# Patient Record
Sex: Female | Born: 1935 | Race: White | Hispanic: No | Smoking: Never smoker
Health system: Southern US, Community
[De-identification: ages and names within clinical notes are randomized; demographics above are authoritative.]

## PROBLEM LIST (undated history)

## (undated) DIAGNOSIS — S329XXA Fracture of unspecified parts of lumbosacral spine and pelvis, initial encounter for closed fracture: Secondary | ICD-10-CM

## (undated) DIAGNOSIS — Z66 Do not resuscitate: Secondary | ICD-10-CM

## (undated) DIAGNOSIS — R410 Disorientation, unspecified: Secondary | ICD-10-CM

## (undated) DIAGNOSIS — M549 Dorsalgia, unspecified: Secondary | ICD-10-CM

## (undated) DIAGNOSIS — E079 Disorder of thyroid, unspecified: Secondary | ICD-10-CM

## (undated) DIAGNOSIS — K219 Gastro-esophageal reflux disease without esophagitis: Secondary | ICD-10-CM

## (undated) DIAGNOSIS — H9190 Unspecified hearing loss, unspecified ear: Secondary | ICD-10-CM

## (undated) HISTORY — PX: ABDOMINAL SURGERY: SHX537

## (undated) HISTORY — PX: CATARACT EXTRACTION: SUR2

## (undated) HISTORY — PX: HEMORRHOID SURGERY: SHX153

---

## 2003-12-29 ENCOUNTER — Ambulatory Visit (HOSPITAL_COMMUNITY): Admission: RE | Admit: 2003-12-29 | Discharge: 2003-12-29 | Payer: Self-pay | Admitting: Internal Medicine

## 2004-01-11 ENCOUNTER — Ambulatory Visit (HOSPITAL_COMMUNITY): Admission: RE | Admit: 2004-01-11 | Discharge: 2004-01-11 | Payer: Self-pay | Admitting: Internal Medicine

## 2009-01-14 ENCOUNTER — Emergency Department (HOSPITAL_COMMUNITY): Admission: EM | Admit: 2009-01-14 | Discharge: 2009-01-14 | Payer: Self-pay | Admitting: Family Medicine

## 2010-12-30 ENCOUNTER — Emergency Department (HOSPITAL_COMMUNITY)
Admission: EM | Admit: 2010-12-30 | Discharge: 2010-12-30 | Payer: Self-pay | Source: Home / Self Care | Admitting: Emergency Medicine

## 2011-01-30 ENCOUNTER — Encounter (HOSPITAL_BASED_OUTPATIENT_CLINIC_OR_DEPARTMENT_OTHER): Payer: Commercial Indemnity | Attending: General Surgery

## 2011-01-30 DIAGNOSIS — S81009A Unspecified open wound, unspecified knee, initial encounter: Secondary | ICD-10-CM | POA: Insufficient documentation

## 2011-01-30 DIAGNOSIS — X58XXXA Exposure to other specified factors, initial encounter: Secondary | ICD-10-CM | POA: Insufficient documentation

## 2011-01-30 DIAGNOSIS — L02419 Cutaneous abscess of limb, unspecified: Secondary | ICD-10-CM | POA: Insufficient documentation

## 2011-03-06 ENCOUNTER — Encounter (HOSPITAL_BASED_OUTPATIENT_CLINIC_OR_DEPARTMENT_OTHER): Payer: Commercial Indemnity | Attending: General Surgery

## 2011-03-06 DIAGNOSIS — X58XXXA Exposure to other specified factors, initial encounter: Secondary | ICD-10-CM | POA: Insufficient documentation

## 2011-03-06 DIAGNOSIS — I739 Peripheral vascular disease, unspecified: Secondary | ICD-10-CM | POA: Insufficient documentation

## 2011-03-06 DIAGNOSIS — Y929 Unspecified place or not applicable: Secondary | ICD-10-CM | POA: Insufficient documentation

## 2011-03-06 DIAGNOSIS — S8990XA Unspecified injury of unspecified lower leg, initial encounter: Secondary | ICD-10-CM | POA: Insufficient documentation

## 2011-04-02 ENCOUNTER — Encounter (HOSPITAL_BASED_OUTPATIENT_CLINIC_OR_DEPARTMENT_OTHER): Payer: Commercial Indemnity | Attending: General Surgery

## 2011-04-02 DIAGNOSIS — L03119 Cellulitis of unspecified part of limb: Secondary | ICD-10-CM | POA: Insufficient documentation

## 2011-04-02 DIAGNOSIS — L02419 Cutaneous abscess of limb, unspecified: Secondary | ICD-10-CM | POA: Insufficient documentation

## 2011-04-02 DIAGNOSIS — S81009A Unspecified open wound, unspecified knee, initial encounter: Secondary | ICD-10-CM | POA: Insufficient documentation

## 2011-04-02 DIAGNOSIS — X58XXXA Exposure to other specified factors, initial encounter: Secondary | ICD-10-CM | POA: Insufficient documentation

## 2011-05-07 ENCOUNTER — Encounter (HOSPITAL_BASED_OUTPATIENT_CLINIC_OR_DEPARTMENT_OTHER): Payer: Commercial Indemnity | Attending: General Surgery

## 2011-05-07 DIAGNOSIS — I739 Peripheral vascular disease, unspecified: Secondary | ICD-10-CM | POA: Insufficient documentation

## 2011-05-07 DIAGNOSIS — Z79899 Other long term (current) drug therapy: Secondary | ICD-10-CM | POA: Insufficient documentation

## 2011-05-07 DIAGNOSIS — S81009A Unspecified open wound, unspecified knee, initial encounter: Secondary | ICD-10-CM | POA: Insufficient documentation

## 2011-05-07 DIAGNOSIS — X58XXXA Exposure to other specified factors, initial encounter: Secondary | ICD-10-CM | POA: Insufficient documentation

## 2011-06-04 ENCOUNTER — Encounter (HOSPITAL_BASED_OUTPATIENT_CLINIC_OR_DEPARTMENT_OTHER): Payer: Commercial Indemnity | Attending: General Surgery

## 2011-06-04 DIAGNOSIS — S91009A Unspecified open wound, unspecified ankle, initial encounter: Secondary | ICD-10-CM | POA: Insufficient documentation

## 2011-06-04 DIAGNOSIS — S81009A Unspecified open wound, unspecified knee, initial encounter: Secondary | ICD-10-CM | POA: Insufficient documentation

## 2011-06-04 DIAGNOSIS — X58XXXA Exposure to other specified factors, initial encounter: Secondary | ICD-10-CM | POA: Insufficient documentation

## 2011-06-04 DIAGNOSIS — Z79899 Other long term (current) drug therapy: Secondary | ICD-10-CM | POA: Insufficient documentation

## 2011-06-04 DIAGNOSIS — I739 Peripheral vascular disease, unspecified: Secondary | ICD-10-CM | POA: Insufficient documentation

## 2011-08-07 ENCOUNTER — Emergency Department (HOSPITAL_COMMUNITY)
Admission: EM | Admit: 2011-08-07 | Discharge: 2011-08-07 | Disposition: A | Discharge status: Home / Self Care | Payer: 59 | Attending: Emergency Medicine | Admitting: Emergency Medicine

## 2011-08-07 ENCOUNTER — Emergency Department (HOSPITAL_COMMUNITY): Payer: 59

## 2011-08-07 DIAGNOSIS — M4120 Other idiopathic scoliosis, site unspecified: Secondary | ICD-10-CM | POA: Insufficient documentation

## 2011-08-07 DIAGNOSIS — M419 Scoliosis, unspecified: Secondary | ICD-10-CM

## 2011-08-07 DIAGNOSIS — R109 Unspecified abdominal pain: Secondary | ICD-10-CM | POA: Insufficient documentation

## 2011-08-07 DIAGNOSIS — Y92009 Unspecified place in unspecified non-institutional (private) residence as the place of occurrence of the external cause: Secondary | ICD-10-CM | POA: Insufficient documentation

## 2011-08-07 DIAGNOSIS — IMO0002 Reserved for concepts with insufficient information to code with codable children: Secondary | ICD-10-CM | POA: Insufficient documentation

## 2011-08-07 DIAGNOSIS — E079 Disorder of thyroid, unspecified: Secondary | ICD-10-CM | POA: Insufficient documentation

## 2011-08-07 DIAGNOSIS — H409 Unspecified glaucoma: Secondary | ICD-10-CM | POA: Insufficient documentation

## 2011-08-07 DIAGNOSIS — S32009A Unspecified fracture of unspecified lumbar vertebra, initial encounter for closed fracture: Secondary | ICD-10-CM

## 2011-08-07 HISTORY — DX: Gastro-esophageal reflux disease without esophagitis: K21.9

## 2011-08-07 HISTORY — DX: Disorder of thyroid, unspecified: E07.9

## 2011-08-07 LAB — CBC
HCT: 37.7 % (ref 36.0–46.0)
Hemoglobin: 12.8 g/dL (ref 12.0–15.0)
MCH: 30.1 pg (ref 26.0–34.0)
MCHC: 34 g/dL (ref 30.0–36.0)
MCV: 88.7 fL (ref 78.0–100.0)
Platelets: 282 10*3/uL (ref 150–400)
RBC: 4.25 MIL/uL (ref 3.87–5.11)
RDW: 13.5 % (ref 11.5–15.5)
WBC: 5.5 10*3/uL (ref 4.0–10.5)

## 2011-08-07 LAB — BASIC METABOLIC PANEL
BUN: 11 mg/dL (ref 6–23)
CO2: 26 mEq/L (ref 19–32)
Calcium: 10.2 mg/dL (ref 8.4–10.5)
Chloride: 93 mEq/L — ABNORMAL LOW (ref 96–112)
Creatinine, Ser: 0.76 mg/dL (ref 0.50–1.10)
GFR calc Af Amer: >60 mL/min (ref >60)
GFR calc non Af Amer: >60 mL/min (ref >60)
Glucose, Bld: 114 mg/dL — ABNORMAL HIGH (ref 70–99)
Potassium: 4.8 mEq/L (ref 3.5–5.1)
Sodium: 132 mEq/L — ABNORMAL LOW (ref 135–145)

## 2011-08-07 LAB — DIFFERENTIAL
Basophils Absolute: 0.1 10*3/uL (ref 0.0–0.1)
Basophils Relative: 2 % — ABNORMAL HIGH (ref 0–1)
Eosinophils Absolute: 0.1 10*3/uL (ref 0.0–0.7)
Eosinophils Relative: 1 % (ref 0–5)
Lymphocytes Relative: 28 % (ref 12–46)
Lymphs Abs: 1.5 10*3/uL (ref 0.7–4.0)
Monocytes Absolute: 0.7 10*3/uL (ref 0.1–1.0)
Monocytes Relative: 12 % (ref 3–12)
Neutro Abs: 3.1 10*3/uL (ref 1.7–7.7)
Neutrophils Relative %: 57 % (ref 43–77)

## 2011-08-07 LAB — URINALYSIS, ROUTINE W REFLEX MICROSCOPIC
Bilirubin Urine: NEGATIVE
Glucose, UA: NEGATIVE mg/dL
Hgb urine dipstick: NEGATIVE
Ketones, ur: NEGATIVE mg/dL
Leukocytes, UA: NEGATIVE
Nitrite: NEGATIVE
Protein, ur: NEGATIVE mg/dL
Specific Gravity, Urine: 1.01 (ref 1.005–1.030)
Urobilinogen, UA: 0.2 mg/dL (ref 0.0–1.0)
pH: 6.5 (ref 5.0–8.0)

## 2011-08-07 MED ORDER — TRAMADOL HCL 50 MG PO TABS
50.0000 mg | ORAL_TABLET | Freq: Once | ORAL | Status: AC
  Administered 2011-08-07: 50 mg via ORAL
  Filled 2011-08-07: qty 1

## 2011-08-07 MED ORDER — TRAMADOL HCL 50 MG PO TABS
50.0000 mg | ORAL_TABLET | Freq: Four times a day (QID) | ORAL | Status: AC | PRN
Start: 2011-08-07 — End: 2011-08-17

## 2011-08-07 NOTE — ED Provider Notes (Signed)
Scribed for Dr. Terressa Koyanagi, the patient was seen in room 7. This chart was scribed by [employer]. This patient's care was started at 18:15.   History     CSN: [id number] Arrival date & time: 08/07/2011  5:22 PM  Chief Complaint  Patient presents with  . Back Pain   Patient is a 76 y.o. female presenting with back pain. The history is provided by the patient and a relative.  Back Pain  Exacerbated by: movement. Pertinent negatives include no chest pain, no fever, no numbness, no headaches, no abdominal pain, no dysuria, no leg pain and no tingling.  Patient presents to the ED with a complaint of a sharp Lower left region back pain. A family member reports that she "sat on the commode about a week ago and caught it on something" with bruising a day or two later. She states that pain has progressively gotten worse and is aggravated by movement. She has recently had a loss of appetite and "pees more often". Patient denies dysuria, abdominal pain, diarrhea, vomiting, fevers or hematuria.    Past Medical History  Diagnosis Date  . Glaucoma   . Thyroid disease   . GERD (gastroesophageal reflux disease)     History reviewed. No pertinent past surgical history.  History reviewed. No pertinent family history.  History  Substance Use Topics  . Smoking status: Not on file  . Smokeless tobacco: Not on file  . Alcohol Use: No    OB History    Grav Para Term Preterm Abortions TAB SAB Ect Mult Living                  Review of Systems  Constitutional: Negative for fever and chills.  HENT: Negative for neck pain.   Eyes: Negative for photophobia and visual disturbance.  Cardiovascular: Negative for chest pain.  Gastrointestinal: Negative for nausea, vomiting, abdominal pain and diarrhea.  Genitourinary: Negative for dysuria and hematuria.  Musculoskeletal: Positive for back pain.  Skin: Negative for pallor and rash.  Neurological: Negative for tingling, numbness and headaches.   Psychiatric/Behavioral: Negative for agitation.  All other systems reviewed and are negative.    Physical Exam  BP 145/70  Pulse 83  Temp(Src) 97.5 F (36.4 C) (Oral)  Resp 20  Wt 120 lb (54.432 kg)  SpO2 100%  Physical Exam  Constitutional: She is oriented to person, place, and time. No distress.  HENT:  Head: Atraumatic.  Mouth/Throat: Oropharynx is clear and moist. No oral lesions. No oropharyngeal exudate.  Eyes:       Bilateral lens implants  Neck: Normal range of motion. Neck supple. No JVD present. No tracheal tenderness present. Carotid bruit is not present. No tracheal deviation present. No thyromegaly present.  Cardiovascular: Normal rate and regular rhythm.   Pulmonary/Chest: Effort normal and breath sounds normal. No stridor. She has no wheezes. She has no rales.  Abdominal: She exhibits no distension. There is no tenderness. There is no rebound and no guarding.  Musculoskeletal: Normal range of motion. She exhibits no tenderness.  Neurological: She is alert and oriented to person, place, and time. No cranial nerve deficit.       Kyphoscoliosis and Perineal tenderness  Skin: Skin is warm and dry. No rash noted.  Psychiatric: She has a normal mood and affect. Her behavior is normal.    ED Course  Procedures  OTHER DATA REVIEWED: Nursing notes, vital signs, and past medical records reviewed. All labs/vitals reviewed and considered Imaging results reviewed and  considered   DIAGNOSTIC STUDIES: Oxygen Saturation is 100% on room air, normal by my interpretation.      LABS / RADIOLOGY: Results for orders placed during the hospital encounter of 08/07/11  CBC      Component Value Range   WBC 5.5  4.0 - 10.5 (K/uL)   RBC 4.25  3.87 - 5.11 (MIL/uL)   Hemoglobin 12.8  12.0 - 15.0 (g/dL)   HCT 16.1  09.6 - 04.5 (%)   MCV 88.7  78.0 - 100.0 (fL)   MCH 30.1  26.0 - 34.0 (pg)   MCHC 34.0  30.0 - 36.0 (g/dL)   RDW 40.9  81.1 - 91.4 (%)   Platelets 282  150 - 400  (K/uL)  DIFFERENTIAL      Component Value Range   Neutrophils Relative 57  43 - 77 (%)   Neutro Abs 3.1  1.7 - 7.7 (K/uL)   Lymphocytes Relative 28  12 - 46 (%)   Lymphs Abs 1.5  0.7 - 4.0 (K/uL)   Monocytes Relative 12  3 - 12 (%)   Monocytes Absolute 0.7  0.1 - 1.0 (K/uL)   Eosinophils Relative 1  0 - 5 (%)   Eosinophils Absolute 0.1  0.0 - 0.7 (K/uL)   Basophils Relative 2 (*) 0 - 1 (%)   Basophils Absolute 0.1  0.0 - 0.1 (K/uL)  BASIC METABOLIC PANEL      Component Value Range   Sodium 132 (*) 135 - 145 (mEq/L)   Potassium 4.8  3.5 - 5.1 (mEq/L)   Chloride 93 (*) 96 - 112 (mEq/L)   CO2 26  19 - 32 (mEq/L)   Glucose, Bld 114 (*) 70 - 99 (mg/dL)   BUN 11  6 - 23 (mg/dL)   Creatinine, Ser 7.82  0.50 - 1.10 (mg/dL)   Calcium 95.6  8.4 - 10.5 (mg/dL)   GFR calc non Af Amer >60  >60 (mL/min)   GFR calc Af Amer >60  >60 (mL/min)  URINALYSIS, ROUTINE W REFLEX MICROSCOPIC      Component Value Range   Color, Urine YELLOW  YELLOW    Appearance CLEAR  CLEAR    Specific Gravity, Urine 1.010  1.005 - 1.030    pH 6.5  5.0 - 8.0    Glucose, UA NEGATIVE  NEGATIVE (mg/dL)   Hgb urine dipstick NEGATIVE  NEGATIVE    Bilirubin Urine NEGATIVE  NEGATIVE    Ketones, ur NEGATIVE  NEGATIVE (mg/dL)   Protein, ur NEGATIVE  NEGATIVE (mg/dL)   Urobilinogen, UA 0.2  0.0 - 1.0 (mg/dL)   Nitrite NEGATIVE  NEGATIVE    Leukocytes, UA NEGATIVE  NEGATIVE     Dg Lumbar Spine Complete  08/07/2011  *RADIOLOGY REPORT*  Clinical Data: Back pain, injury  LUMBAR SPINE - COMPLETE 4+ VIEW  Comparison: None.  Findings: Wedging of the L2 vertebral body, with approximately 25% loss of height, age indeterminate.  Otherwise, no fracture or dislocation seen.  Multilevel degenerative changes.  Vascular calcifications.  IMPRESSION: Wedging of the L2 vertebral body, with approximately 25% loss of height, age indeterminate.  Multilevel degenerative changes.  Original Report Authenticated By: Charline Bills, M.D.     PROCEDURES:   ED COURSE / COORDINATION OF CARE: Orders Placed This Encounter  Procedures  . Urine culture  . DG Lumbar Spine Complete  . CT Abdomen Pelvis Wo Contrast  . CBC  . Differential  . Basic metabolic panel  . Urinalysis, Routine w reflex microscopic  MDM: Differential Diagnosis: COMPRESSION FRACTURE  IMPRESSION: Diagnoses that have been ruled out:  Diagnoses that are still under consideration:  Final diagnoses:      PLAThe patient is to return the emergency department if there is any worsening of symptoms.  Return for weakness numbness or any concerns.  Follow up with your doctor I have reviewed the discharge instructions with thE PATIENT  I personally performed the services described in this documentation, which was scribed in my presence. The recorded information has been reviewed and considered. Lynnie Koehler Smitty Cords, MD    MEDICATIONS GIVEN IN THE E.D.  Medications  levothyroxine (SYNTHROID, LEVOTHROID) 50 MCG tablet (not administered)  Calcium Carbonate-Vitamin D (CALCIUM 600 + D PO) (not administered)  Cholecalciferol (VITAMIN D3) 1000 UNITS CAPS (not administered)  calcium carbonate (TUMS EX) 750 MG chewable tablet (not administered)  timolol (BETIMOL) 0.5 % ophthalmic solution (not administered)  traMADol (ULTRAM) tablet 50 mg (50 mg Oral Given 08/07/11 1813)     DISCHARGE MEDICATIONS: New Prescriptions   No medications on file           Velvia Mehrer K Arber Wiemers-Rasch, MD 08/07/11 2008

## 2011-08-07 NOTE — ED Notes (Signed)
Pt reports some improvement in pain level.  States it is tolerable at this time. Denies any needs at present time.  No distress noted.

## 2011-08-07 NOTE — ED Notes (Signed)
Family states pt was sitting on the commode and the back of the seat hit her in the back.

## 2011-08-08 LAB — URINE CULTURE
Colony Count: NO GROWTH
Culture  Setup Time: 201208081840
Culture: NO GROWTH

## 2012-07-24 ENCOUNTER — Emergency Department (HOSPITAL_COMMUNITY): Payer: Medicare (Managed Care)

## 2012-07-24 ENCOUNTER — Encounter (HOSPITAL_COMMUNITY): Payer: Self-pay | Admitting: Emergency Medicine

## 2012-07-24 ENCOUNTER — Inpatient Hospital Stay (HOSPITAL_COMMUNITY)
Admission: EM | Admit: 2012-07-24 | Discharge: 2012-07-28 | DRG: 603 | Disposition: A | Discharge status: Home Health | Payer: Medicare (Managed Care) | Attending: Internal Medicine | Admitting: Internal Medicine

## 2012-07-24 DIAGNOSIS — L03119 Cellulitis of unspecified part of limb: Principal | ICD-10-CM | POA: Diagnosis present

## 2012-07-24 DIAGNOSIS — L03116 Cellulitis of left lower limb: Secondary | ICD-10-CM

## 2012-07-24 DIAGNOSIS — Z809 Family history of malignant neoplasm, unspecified: Secondary | ICD-10-CM

## 2012-07-24 DIAGNOSIS — Z880 Allergy status to penicillin: Secondary | ICD-10-CM

## 2012-07-24 DIAGNOSIS — L02419 Cutaneous abscess of limb, unspecified: Principal | ICD-10-CM | POA: Diagnosis present

## 2012-07-24 DIAGNOSIS — H409 Unspecified glaucoma: Secondary | ICD-10-CM | POA: Diagnosis present

## 2012-07-24 DIAGNOSIS — B353 Tinea pedis: Secondary | ICD-10-CM

## 2012-07-24 DIAGNOSIS — Z91018 Allergy to other foods: Secondary | ICD-10-CM

## 2012-07-24 DIAGNOSIS — K219 Gastro-esophageal reflux disease without esophagitis: Secondary | ICD-10-CM

## 2012-07-24 DIAGNOSIS — E039 Hypothyroidism, unspecified: Secondary | ICD-10-CM

## 2012-07-24 DIAGNOSIS — Z882 Allergy status to sulfonamides status: Secondary | ICD-10-CM

## 2012-07-24 LAB — CBC WITH DIFFERENTIAL/PLATELET
Basophils Absolute: 0.1 10*3/uL (ref 0.0–0.1)
Basophils Relative: 1 % (ref 0–1)
Eosinophils Absolute: 0.3 10*3/uL (ref 0.0–0.7)
Eosinophils Relative: 4 % (ref 0–5)
HCT: 36.9 % (ref 36.0–46.0)
Hemoglobin: 12.2 g/dL (ref 12.0–15.0)
Lymphocytes Relative: 27 % (ref 12–46)
Lymphs Abs: 1.9 10*3/uL (ref 0.7–4.0)
MCH: 30.2 pg (ref 26.0–34.0)
MCHC: 33.1 g/dL (ref 30.0–36.0)
MCV: 91.3 fL (ref 78.0–100.0)
Monocytes Absolute: 0.8 10*3/uL (ref 0.1–1.0)
Monocytes Relative: 11 % (ref 3–12)
Neutro Abs: 4.1 10*3/uL (ref 1.7–7.7)
Neutrophils Relative %: 58 % (ref 43–77)
Platelets: 268 10*3/uL (ref 150–400)
RBC: 4.04 MIL/uL (ref 3.87–5.11)
RDW: 13.8 % (ref 11.5–15.5)
WBC: 7.1 10*3/uL (ref 4.0–10.5)

## 2012-07-24 LAB — COMPREHENSIVE METABOLIC PANEL
ALT: 12 U/L (ref 0–35)
AST: 20 U/L (ref 0–37)
Albumin: 4.1 g/dL (ref 3.5–5.2)
Alkaline Phosphatase: 60 U/L (ref 39–117)
BUN: 15 mg/dL (ref 6–23)
CO2: 29 mEq/L (ref 19–32)
Calcium: 10.1 mg/dL (ref 8.4–10.5)
Chloride: 96 mEq/L (ref 96–112)
Creatinine, Ser: 0.82 mg/dL (ref 0.50–1.10)
GFR calc Af Amer: 67 mL/min — ABNORMAL LOW (ref >90)
GFR calc non Af Amer: 58 mL/min — ABNORMAL LOW (ref >90)
Glucose, Bld: 113 mg/dL — ABNORMAL HIGH (ref 70–99)
Potassium: 4 mEq/L (ref 3.5–5.1)
Sodium: 134 mEq/L — ABNORMAL LOW (ref 135–145)
Total Bilirubin: 0.3 mg/dL (ref 0.3–1.2)
Total Protein: 7.8 g/dL (ref 6.0–8.3)

## 2012-07-24 LAB — URINALYSIS, ROUTINE W REFLEX MICROSCOPIC
Bilirubin Urine: NEGATIVE
Glucose, UA: NEGATIVE mg/dL
Hgb urine dipstick: NEGATIVE
Ketones, ur: NEGATIVE mg/dL
Leukocytes, UA: NEGATIVE
Nitrite: NEGATIVE
Protein, ur: NEGATIVE mg/dL
Specific Gravity, Urine: <1.005 — ABNORMAL LOW (ref 1.005–1.030)
Urobilinogen, UA: 0.2 mg/dL (ref 0.0–1.0)
pH: 6.5 (ref 5.0–8.0)

## 2012-07-24 NOTE — ED Provider Notes (Signed)
History  This chart was scribed for Geoffery Lyons, MD by Bennett Scrape. This patient was seen in room APA11/APA11 and the patient's care was started at 9:59PM  CSN: [id number]  Arrival date & time 07/24/12  2130   First MD Initiated Contact with Patient 07/24/12 2159      Chief Complaint  Patient presents with  . Leg Swelling    The history is provided by the patient and a relative (daughter). No language interpreter was used.    Tina Lane is a 78 y.o. female who presents to the Emergency Department complaining of one week of gradual onset, gradually worsening, constant left lower calf swelling with occasional sharp pains and itching. She reports that the symptoms improve at night but daughter states that the pt has not been consistent with elevating her legs. Daughter denies pt having prior episodes of similar symptoms. Pt denies taking OTC medications at home to improve symptoms. She denies fever, chills, nausea and emesis as associated symptoms. She has a h/o glaucoma, thyroid disease and GERD. She denies smoking and alcohol use.  No current PCP. Lives in [place].   Past Medical History  Diagnosis Date  . Glaucoma   . Thyroid disease   . GERD (gastroesophageal reflux disease)     Past Surgical History  Procedure Date  . Abdominal surgery     History reviewed. No pertinent family history.  History  Substance Use Topics  . Smoking status: Never Smoker   . Smokeless tobacco: Not on file  . Alcohol Use: No  Pt lives by herself.  No OB history provided.  Review of Systems  Constitutional: Negative for fever and chills.  Gastrointestinal: Negative for nausea and vomiting.  Skin: Negative for rash.       Erythema and swelling to left lower calf   All other systems reviewed and are negative.    Allergies  Onion; Other; Penicillins; and Sulfa antibiotics  Home Medications   Current Outpatient Rx  Name Route Sig Dispense Refill  . CALCIUM CARBONATE ANTACID  750 MG PO CHEW Oral Chew 1 tablet by mouth at bedtime.      Marland Kitchen CALCIUM 600 + D PO Oral Take 1 tablet by mouth daily.      Marland Kitchen VITAMIN D3 1000 UNITS PO CAPS Oral Take 1 capsule by mouth daily.      Marland Kitchen LEVOTHYROXINE SODIUM 50 MCG PO TABS Oral Take 50 mcg by mouth daily.      Marland Kitchen TIMOLOL HEMIHYDRATE 0.5 % OP SOLN Both Eyes Place 1 drop into both eyes at bedtime.        Triage Vitals: BP 162/79  Pulse 96  Temp 98.1 F (36.7 C) (Oral)  Resp 14  Ht 5' (1.524 m)  Wt 120 lb (54.432 kg)  BMI 23.44 kg/m2  SpO2 99%  Physical Exam  Nursing note and vitals reviewed. Constitutional: She is oriented to person, place, and time. She appears well-developed and well-nourished. No distress.  HENT:  Head: Normocephalic and atraumatic.  Eyes: Conjunctivae and EOM are normal.  Neck: Neck supple. No tracheal deviation present.  Cardiovascular: Normal rate, regular rhythm and intact distal pulses.   No murmur heard. Pulmonary/Chest: Effort normal and breath sounds normal. No respiratory distress.  Abdominal: Soft. She exhibits no distension.  Musculoskeletal: Normal range of motion.  Neurological: She is alert and oriented to person, place, and time.       motor and sensation are intact in the distal left extremity.   Skin: Skin  is warm and dry.       Left lower calf has erythema from the mid calf down and is warm to the touch   Psychiatric: She has a normal mood and affect. Her behavior is normal.    ED Course  Procedures (including critical care time)  DIAGNOSTIC STUDIES: Oxygen Saturation is 99% on room air, normal by my interpretation.    COORDINATION OF CARE: 10:12PM-Discussed treatment plan which includes admission for repeat IV antibiotic doses with pt and daughter at bedside and both agreed to plan.  Labs Reviewed - No data to display No results found.   No diagnosis found.    MDM  The patient presents with a severe case of cellulitis of the left leg.  Due to the severity and  patient's age, I feel as though she will require iv antibiotics.  I have consulted Dr. Orvan Falconer from internal medicine who will come and see the patient.  I will defer the choice of antibiotics to him as he is currently in the room with the patient.  CBC shows no leukocytosis, bnp within normal limits.  She will be admitted to the medicine service.        I personally performed the services described in this documentation, which was scribed in my presence. The recorded information has been reviewed and considered.      Geoffery Lyons, MD 07/25/12 0800

## 2012-07-24 NOTE — ED Notes (Signed)
Pt has bilateral lower leg swelling L  leg worse than R leg. Legs red and hot to touch; Area is below the knees to ankles  pt states itching with occ pain.

## 2012-07-24 NOTE — ED Notes (Signed)
Patient complaining of left leg swelling, redness, itching, and pain that started approximately a week ago. Hot to the touch.

## 2012-07-25 ENCOUNTER — Encounter (HOSPITAL_COMMUNITY): Payer: Self-pay | Admitting: *Deleted

## 2012-07-25 ENCOUNTER — Inpatient Hospital Stay (HOSPITAL_COMMUNITY): Payer: Medicare (Managed Care)

## 2012-07-25 DIAGNOSIS — K219 Gastro-esophageal reflux disease without esophagitis: Secondary | ICD-10-CM

## 2012-07-25 DIAGNOSIS — B353 Tinea pedis: Secondary | ICD-10-CM

## 2012-07-25 DIAGNOSIS — L02419 Cutaneous abscess of limb, unspecified: Secondary | ICD-10-CM

## 2012-07-25 DIAGNOSIS — E039 Hypothyroidism, unspecified: Secondary | ICD-10-CM

## 2012-07-25 DIAGNOSIS — L03119 Cellulitis of unspecified part of limb: Secondary | ICD-10-CM

## 2012-07-25 LAB — BASIC METABOLIC PANEL
BUN: 12 mg/dL (ref 6–23)
CO2: 29 mEq/L (ref 19–32)
Chloride: 100 mEq/L (ref 96–112)
GFR calc non Af Amer: 69 mL/min — ABNORMAL LOW (ref >90)
Glucose, Bld: 95 mg/dL (ref 70–99)
Potassium: 4.2 mEq/L (ref 3.5–5.1)
Sodium: 136 mEq/L (ref 135–145)

## 2012-07-25 LAB — CBC
HCT: 36.2 % (ref 36.0–46.0)
Hemoglobin: 12 g/dL (ref 12.0–15.0)
MCH: 30.1 pg (ref 26.0–34.0)
MCHC: 33.1 g/dL (ref 30.0–36.0)
MCV: 90.7 fL (ref 78.0–100.0)
RBC: 3.99 MIL/uL (ref 3.87–5.11)

## 2012-07-25 LAB — T4, FREE: Free T4: 0.77 ng/dL — ABNORMAL LOW (ref 0.80–1.80)

## 2012-07-25 LAB — TSH: TSH: 13.665 u[IU]/mL — ABNORMAL HIGH (ref 0.350–4.500)

## 2012-07-25 MED ORDER — LEVOTHYROXINE SODIUM 50 MCG PO TABS
50.0000 ug | ORAL_TABLET | Freq: Every day | ORAL | Status: DC
  Administered 2012-07-25 – 2012-07-26 (×2): 50 ug via ORAL
  Filled 2012-07-25 (×2): qty 1

## 2012-07-25 MED ORDER — POTASSIUM CHLORIDE IN NACL 20-0.9 MEQ/L-% IV SOLN
INTRAVENOUS | Status: DC
  Administered 2012-07-25: 02:00:00 via INTRAVENOUS

## 2012-07-25 MED ORDER — DOXYCYCLINE HYCLATE 100 MG IV SOLR
100.0000 mg | Freq: Two times a day (BID) | INTRAVENOUS | Status: DC
  Administered 2012-07-25 – 2012-07-28 (×8): 100 mg via INTRAVENOUS
  Filled 2012-07-25 (×10): qty 100

## 2012-07-25 MED ORDER — ACETAMINOPHEN 650 MG RE SUPP: 650.0000 mg | Freq: Four times a day (QID) | RECTAL | Status: DC | PRN

## 2012-07-25 MED ORDER — METRONIDAZOLE IN NACL 5-0.79 MG/ML-% IV SOLN
500.0000 mg | Freq: Three times a day (TID) | INTRAVENOUS | Status: DC
  Administered 2012-07-25 – 2012-07-28 (×10): 500 mg via INTRAVENOUS
  Filled 2012-07-25 (×13): qty 100

## 2012-07-25 MED ORDER — SENNOSIDES-DOCUSATE SODIUM 8.6-50 MG PO TABS: 1.0000 | ORAL_TABLET | Freq: Every evening | ORAL | Status: DC | PRN

## 2012-07-25 MED ORDER — BISACODYL 5 MG PO TBEC: 5.0000 mg | DELAYED_RELEASE_TABLET | Freq: Every day | ORAL | Status: DC | PRN

## 2012-07-25 MED ORDER — TRAZODONE HCL 50 MG PO TABS: 25.0000 mg | ORAL_TABLET | Freq: Every evening | ORAL | Status: DC | PRN

## 2012-07-25 MED ORDER — PANTOPRAZOLE SODIUM 40 MG PO TBEC
40.0000 mg | DELAYED_RELEASE_TABLET | Freq: Every day | ORAL | Status: DC
  Administered 2012-07-25 – 2012-07-28 (×4): 40 mg via ORAL
  Filled 2012-07-25 (×5): qty 1

## 2012-07-25 MED ORDER — FLEET ENEMA 7-19 GM/118ML RE ENEM: 1.0000 | ENEMA | Freq: Once | RECTAL | Status: AC | PRN

## 2012-07-25 MED ORDER — TIMOLOL MALEATE 0.5 % OP SOLN
1.0000 [drp] | Freq: Every day | OPHTHALMIC | Status: DC
  Administered 2012-07-25 – 2012-07-27 (×3): 1 [drp] via OPHTHALMIC

## 2012-07-25 MED ORDER — ENOXAPARIN SODIUM 40 MG/0.4ML ~~LOC~~ SOLN
40.0000 mg | Freq: Every day | SUBCUTANEOUS | Status: DC
  Administered 2012-07-25 – 2012-07-28 (×4): 40 mg via SUBCUTANEOUS
  Filled 2012-07-25 (×4): qty 0.4

## 2012-07-25 MED ORDER — ACETAMINOPHEN 325 MG PO TABS: 650.0000 mg | ORAL_TABLET | ORAL | Status: DC | PRN

## 2012-07-25 MED ORDER — CLOTRIMAZOLE 1 % EX CREA
TOPICAL_CREAM | CUTANEOUS | Status: AC
  Filled 2012-07-25: qty 15

## 2012-07-25 MED ORDER — TIMOLOL HEMIHYDRATE 0.5 % OP SOLN
1.0000 [drp] | Freq: Every day | OPHTHALMIC | Status: DC
  Administered 2012-07-25: 1 [drp] via OPHTHALMIC
  Filled 2012-07-25: qty 0.1
  Filled 2012-07-25: qty 5
  Filled 2012-07-25 (×9): qty 0.1

## 2012-07-25 MED ORDER — CLOTRIMAZOLE 1 % EX CREA
TOPICAL_CREAM | Freq: Two times a day (BID) | CUTANEOUS | Status: DC
  Administered 2012-07-25 – 2012-07-27 (×6): via TOPICAL
  Administered 2012-07-27: 1 via TOPICAL
  Administered 2012-07-28: 09:00:00 via TOPICAL
  Filled 2012-07-25 (×2): qty 15

## 2012-07-25 MED ORDER — ONDANSETRON HCL 4 MG/2ML IJ SOLN: 4.0000 mg | INTRAMUSCULAR | Status: DC | PRN

## 2012-07-25 MED ORDER — DOXYCYCLINE HYCLATE 100 MG IV SOLR
INTRAVENOUS | Status: AC
  Filled 2012-07-25: qty 100

## 2012-07-25 NOTE — Progress Notes (Signed)
     Subjective: This lady came in yesterday with left lower leg cellulitis which is rather extensive. She has been started on doxycycline.           Physical Exam: Blood pressure 154/79, pulse 71, temperature 98 F (36.7 C), temperature source Oral, resp. rate 17, height 5' (1.524 m), weight 55.5 kg (122 lb 5.7 oz), SpO2 95.00%. She looks systemically well. She is not toxic or septic. Heart sounds are present and normal. Lung fields are clear.   Investigations:     Basic Metabolic Panel:  Basename 07/25/12 0701 07/24/12 2237  NA 136 134*  K 4.2 4.0  CL 100 96  CO2 29 29  GLUCOSE 95 113*  BUN 12 15  CREATININE 0.73 0.82  CALCIUM 9.3 10.1  MG -- --  PHOS -- --   Liver Function Tests:  [facility] 07/24/12 2237  AST 20  ALT 12  ALKPHOS 60  BILITOT 0.3  PROT 7.8  ALBUMIN 4.1     CBC:  Basename 07/25/12 0701 07/24/12 2237  WBC 6.0 7.1  NEUTROABS -- 4.1  HGB 12.0 12.2  HCT 36.2 36.9  MCV 90.7 91.3  PLT 258 268    Dg Chest 2 View  07/25/2012  *RADIOLOGY REPORT*  Clinical Data: Lower extremity swelling  CHEST - 2 VIEW  Comparison: None.  Findings: Moderate cardiomegaly.  Interstitial prominence. Bronchitic changes.  Costochondral calcifications are noted. Apical pleural thickening is irregular with symmetrical.  Kyphosis of the spine with osteopenia.  No pneumothorax and no pleural effusion.  Increased AP diameter the chest.  Hyperaeration.  IMPRESSION: Cardiomegaly without edema.  Changes related to COPD.  Original Report Authenticated By: Donavan Burnet, M.D.      Medications: I have reviewed the patient's current medications.  Impression: 1. Cellulitis of the left leg. 2. Hypothyroidism. 3. Glaucoma. 4. GERD. 5. Tinea pedis.     Plan: 1. Add intravenous metronidazole for anaerobic coverage. Otherwise, continue with intravenous doxycycline.     LOS: 1 day   Wilson Singer Pager [phone]  07/25/2012, 9:35 AM

## 2012-07-25 NOTE — Progress Notes (Signed)
ANTIBIOTIC CONSULT NOTE - INITIAL  Pharmacy Consult for Doxycycline Indication: cellulitis  Allergies  Allergen Reactions  . Onion Nausea And Vomiting  . Other     SPANDEX ALLERGY  . Penicillins Other (See Comments)    UNKNOWN REACTION  . Sulfa Antibiotics Other (See Comments)    UNKNOWN REACTION    Patient Measurements: Height: 5' (152.4 cm) Weight: 122 lb 5.7 oz (55.5 kg) IBW/kg (Calculated) : 45.5   Vital Signs: Temp: 98 F (36.7 C) (07/27 0510) Temp src: Oral (07/27 0510) BP: 154/79 mmHg (07/27 0510) Pulse Rate: 71  (07/27 0510) Intake/Output from previous day: 07/26 0701 - 07/27 0700 In: 396.7 [I.V.:146.7; IV Piggyback:250] Out: -  Intake/Output from this shift:    Labs:  Basename 07/25/12 0701 07/24/12 2237  WBC 6.0 7.1  HGB 12.0 12.2  PLT 258 268  LABCREA -- --  CREATININE 0.73 0.82   Estimated Creatinine Clearance: 30.7 ml/min (by C-G formula based on Cr of 0.73). No results found for this basename: VANCOTROUGH:2,VANCOPEAK:2,VANCORANDOM:2,GENTTROUGH:2,GENTPEAK:2,GENTRANDOM:2,TOBRATROUGH:2,TOBRAPEAK:2,TOBRARND:2,AMIKACINPEAK:2,AMIKACINTROU:2,AMIKACIN:2, in the last 72 hours   Microbiology: No results found for this or any previous visit (from the past 720 hour(s)).  Medical History: Past Medical History  Diagnosis Date  . Glaucoma   . Thyroid disease   . GERD (gastroesophageal reflux disease)     Medications:  Scheduled:    . clotrimazole   Topical BID  . doxycycline (VIBRAMYCIN) IV  100 mg Intravenous Q12H  . enoxaparin (LOVENOX) injection  40 mg Subcutaneous Q0600  . levothyroxine  50 mcg Oral QAC breakfast  . pantoprazole  40 mg Oral Q1200  . timolol  1 drop Both Eyes QHS  . DISCONTD: timolol  1 drop Both Eyes QHS   Assessment: 78 yo F started on doxycycline for cellulitis of LLE.  This patient has good renal function.  Doxycycline dose is not adjusted for renal function.   Goal of Therapy:  Eradicate infection.  Plan:  1)  Continue doxycycline 100mg  q12h- change to oral form as appropriate.  2) Pharmacy to sign off. Please re-consult as needed.   Elson Clan 07/25/2012,9:09 AM

## 2012-07-25 NOTE — H&P (Signed)
[facility] History and Physical  Tina Lane ZOX:[id number] DOB: 1912-09-18 DOA: 07/25/2012    PCP:   No primary provider on file.   Patient is alert and extremely pleasant, but also very hard of hearing, so much of the history was taken with the assistance of her daughter-in-law Chany Woolworth.  Chief Complaint:  Swelling and redness of right leg for one week  HPI: Tina Lane is an 78 y.o. female.   Elderly Caucasian lady maintains a relatively good health, but does not visit physicians often, lives alone and is independent with all activities of daily living, noted progressive swelling and redness and itching of the right leg for one week. She has chronic vasculopathy of both legs so they both tend to be dark, but she has noted little change on the right.  There is no history of trauma, she had no fevers nor chills. He denies chest pain shortness of breath or hemoptysis. She has not been taking antibiotics.  Her son is away on business, but her daughter-in-law visited her today and noted the status of her leg and she was brought to the emergency room for further evaluation.  Rewiew of Systems:   All systems negative except as marked bold or noted in the HPI;  Constitutional: Negative for malaise, fever and chills. ;  Eyes: Negative for eye pain, redness and discharge. ;  ENMT: Negative for ear pain, hoarseness, nasal congestion, sinus pressure and sore throat. ;  Cardiovascular: Negative for chest pain, palpitations, diaphoresis, dyspnea and peripheral edema. ;  Respiratory: Negative for cough, hemoptysis, wheezing and stridor. ;  Gastrointestinal: Negative for nausea, vomiting, diarrhea, constipation, abdominal pain, melena, blood in stool, hematemesis, jaundice and rectal bleeding. unusual weight loss..   Genitourinary: Negative for frequency, dysuria, incontinence,flank pain and hematuria; Musculoskeletal: Negative for back pain and neck pain. Negative fotrauma.;  Skin:  . Negative for  rash, abrasions, bruising and ; ulcerations Neuro: Negative for headache, lightheadedness and neck stiffness. Negative for weakness, altered level of consciousness , altered mental status, extremity weakness, burning feet, involuntary movement, seizure and syncope.  Psych: negative for anxiety, depression, insomnia, tearfulness, panic attacks, hallucinations, paranoia, suicidal or homicidal ideation    Past Medical History  Diagnosis Date  . Glaucoma   . Thyroid disease   . GERD (gastroesophageal reflux disease)     Past Surgical History  Procedure Date  . Abdominal surgery     Medications:  Is not clear if she is still taking Synthroid.  HOME MEDS: Prior to Admission medications   Medication Sig Start Date End Date Taking? Authorizing Provider  calcium carbonate (TUMS EX) 750 MG chewable tablet Chew 1 tablet by mouth at bedtime.      Historical Provider, MD  Calcium Carbonate-Vitamin D (CALCIUM 600 + D PO) Take 1 tablet by mouth daily.      Historical Provider, MD  Cholecalciferol (VITAMIN D3) 1000 UNITS CAPS Take 1 capsule by mouth daily.      Historical Provider, MD  levothyroxine (SYNTHROID, LEVOTHROID) 50 MCG tablet Take 50 mcg by mouth daily.      Historical Provider, MD  timolol (BETIMOL) 0.5 % ophthalmic solution Place 1 drop into both eyes at bedtime.      Historical Provider, MD     Allergies:  Allergies  Allergen Reactions  . Onion Nausea And Vomiting  . Other     SPANDEX ALLERGY  . Penicillins Other (See Comments)    UNKNOWN REACTION  . Sulfa Antibiotics Other (See  Comments)    UNKNOWN REACTION    Social History:   reports that she has never smoked. She does not have any smokeless tobacco history on file. She reports that she does not drink alcohol or use illicit drugs.  Family History: Family History  Problem Relation Age of Onset  . Cancer Sister     unknown  . Cancer Sister     unknown  . Coronary artery disease Father   . Cancer  Mother     died age 70     Physical Exam: Filed Vitals:   07/24/12 2144  BP: 162/79  Pulse: 96  Temp: 98.1 F (36.7 C)  TempSrc: Oral  Resp: 14  Height: 5' (1.524 m)  Weight: 54.432 kg (120 lb)  SpO2: 99%   Blood pressure 162/79, pulse 96, temperature 98.1 F (36.7 C), temperature source Oral, resp. rate 14, height 5' (1.524 m), weight 54.432 kg (120 lb), SpO2 99.00%.  GEN:  Very pleasant elderly Caucasian lady lying in the stretcherin no acute distress; cooperative with exam, but very hard of hearing PSYCH:  alert and oriented x4; does not appear anxious or depressed; affect is appropriate. HEENT: Mucous membranes pink and anicteric; PERRLA; EOM intact; no cervical lymphadenopathy nor thyromegaly or carotid bruit; no JVD; Breasts:: Not examined CHEST WALL: No tenderness CHEST: Normal respiration, clear to auscultation bilaterally HEART: Regular rate and rhythm; 2/6 systolic murmurno rubs or gallops BACK: Marked kyphosis and scoliosis; no CVA tenderness ABDOMEN: soft non-tender; no masses, no organomegaly, normal abdominal bowel sounds; no pannus; no intertriginous candida. Rectal Exam: Not done EXTREMITIES: Warmth, Erythema and tenderness of lower third of the left leg, the foot is not involved; she has hyperpigmentation of the skin of the lower half of the right leg no edema; no ulcerations; there is no weeping and no H. Of the skin;  arthritic deformities of the hands and knees appropriate for age Genitalia: not examined PULSES: 2+ and symmetric; good capillary refill bilaterally SKIN: Normal hydration no rash or ulceration CNS: Cranial nerves 2-12 grossly intact no focal lateralizing neurologic deficit   Labs on Admission:  Basic Metabolic Panel:  Lab 07/24/12 1610  NA 134*  K 4.0  CL 96  CO2 29  GLUCOSE 113*  BUN 15  CREATININE 0.82  CALCIUM 10.1  MG --  PHOS --   Liver Function Tests:  Lab 07/24/12 2237  AST 20  ALT 12  ALKPHOS 60  BILITOT 0.3  PROT  7.8  ALBUMIN 4.1   No results found for this basename: LIPASE:5,AMYLASE:5 in the last 168 hours No results found for this basename: AMMONIA:5 in the last 168 hours CBC:  Lab 07/24/12 2237  WBC 7.1  NEUTROABS 4.1  HGB 12.2  HCT 36.9  MCV 91.3  PLT 268   Cardiac Enzymes: No results found for this basename: CKTOTAL:5,CKMB:5,CKMBINDEX:5,TROPONINI:5 in the last 168 hours BNP: No components found with this basename: POCBNP:5 CBG: No results found for this basename: GLUCAP:5 in the last 168 hours  Results for orders placed during the hospital encounter of 07/24/12 (from the past 48 hour(s))  CBC WITH DIFFERENTIAL     Status: Normal   Collection Time   07/24/12 10:37 PM      Component Value Range Comment   WBC 7.1  4.0 - 10.5 K/uL    RBC 4.04  3.87 - 5.11 MIL/uL    Hemoglobin 12.2  12.0 - 15.0 g/dL    HCT 96.0  45.4 - 09.8 %  MCV 91.3  78.0 - 100.0 fL    MCH 30.2  26.0 - 34.0 pg    MCHC 33.1  30.0 - 36.0 g/dL    RDW 16.1  09.6 - 04.5 %    Platelets 268  150 - 400 K/uL    Neutrophils Relative 58  43 - 77 %    Neutro Abs 4.1  1.7 - 7.7 K/uL    Lymphocytes Relative 27  12 - 46 %    Lymphs Abs 1.9  0.7 - 4.0 K/uL    Monocytes Relative 11  3 - 12 %    Monocytes Absolute 0.8  0.1 - 1.0 K/uL    Eosinophils Relative 4  0 - 5 %    Eosinophils Absolute 0.3  0.0 - 0.7 K/uL    Basophils Relative 1  0 - 1 %    Basophils Absolute 0.1  0.0 - 0.1 K/uL   COMPREHENSIVE METABOLIC PANEL     Status: Abnormal   Collection Time   07/24/12 10:37 PM      Component Value Range Comment   Sodium 134 (*) 135 - 145 mEq/L    Potassium 4.0  3.5 - 5.1 mEq/L    Chloride 96  96 - 112 mEq/L    CO2 29  19 - 32 mEq/L    Glucose, Bld 113 (*) 70 - 99 mg/dL    BUN 15  6 - 23 mg/dL    Creatinine, Ser 4.09  0.50 - 1.10 mg/dL    Calcium 81.1  8.4 - 10.5 mg/dL    Total Protein 7.8  6.0 - 8.3 g/dL    Albumin 4.1  3.5 - 5.2 g/dL    AST 20  0 - 37 U/L    ALT 12  0 - 35 U/L    Alkaline Phosphatase 60  39 - 117  U/L    Total Bilirubin 0.3  0.3 - 1.2 mg/dL    GFR calc non Af Amer 58 (*) >90 mL/min    GFR calc Af Amer 67 (*) >90 mL/min   URINALYSIS, ROUTINE W REFLEX MICROSCOPIC     Status: Abnormal   Collection Time   07/24/12 11:32 PM      Component Value Range Comment   Color, Urine STRAW (*) YELLOW    APPearance CLEAR  CLEAR    Specific Gravity, Urine <1.005 (*) 1.005 - 1.030    pH 6.5  5.0 - 8.0    Glucose, UA NEGATIVE  NEGATIVE mg/dL    Hgb urine dipstick NEGATIVE  NEGATIVE    Bilirubin Urine NEGATIVE  NEGATIVE    Ketones, ur NEGATIVE  NEGATIVE mg/dL    Protein, ur NEGATIVE  NEGATIVE mg/dL    Urobilinogen, UA 0.2  0.0 - 1.0 mg/dL    Nitrite NEGATIVE  NEGATIVE    Leukocytes, UA NEGATIVE  NEGATIVE MICROSCOPIC NOT DONE ON URINES WITH NEGATIVE PROTEIN, BLOOD, LEUKOCYTES, NITRITE, OR GLUCOSE <1000 mg/dL.     Radiological Exams on Admission: Dg Chest 2 View  07/25/2012  *RADIOLOGY REPORT*  Clinical Data: Lower extremity swelling  CHEST - 2 VIEW  Comparison: None.  Findings: Moderate cardiomegaly.  Interstitial prominence. Bronchitic changes.  Costochondral calcifications are noted. Apical pleural thickening is irregular with symmetrical.  Kyphosis of the spine with osteopenia.  No pneumothorax and no pleural effusion.  Increased AP diameter the chest.  Hyperaeration.  IMPRESSION: Cardiomegaly without edema.  Changes related to COPD.  Original Report Authenticated By: Donavan Burnet, M.D.  Assessment/Plan Present on Admission:  .Cellulitis of left leg  In view of her allergies will start doxycycline per pharmacy  Check venous Doppler ultrasound of the legs to rule out DVT  .Hypothyroidism  We'll continue her Synthroid for now, but will check thyroid function since we cannot ascertain whether she is currently taking since  .Glaucoma  Continue atenolol  .Tinea pedis  treat tinea pedis which may be the source of her cellulitis  .GERD (gastroesophageal reflux disease)  Start PPI while in  hospital  Because of patient's advanced age, and although she has no dementia,f daughter-in-law has agreed to stay with the patient in to try to prevent sundowning    Code Status: DNR  Family Communication: Son Latanza Pfefferkorn [phone]); Jacoby Ritsema (daughter-in-law) [phone]  Disposition Plan:    Vadie Principato Nocturnist [facility] Pager [phone]   07/25/2012, 1:10 AM

## 2012-07-26 MED ORDER — LEVOTHYROXINE SODIUM 100 MCG PO TABS: 100.0000 ug | ORAL_TABLET | Freq: Every day | ORAL | Status: DC

## 2012-07-26 MED ORDER — LEVOTHYROXINE SODIUM 50 MCG PO TABS
50.0000 ug | ORAL_TABLET | Freq: Every day | ORAL | Status: DC
  Administered 2012-07-27 – 2012-07-28 (×2): 50 ug via ORAL
  Filled 2012-07-26 (×2): qty 1

## 2012-07-26 NOTE — Progress Notes (Signed)
     Subjective: This lady is left lower leg cellulitis is clearly improving. She has not mobilized significantly.           Physical Exam: Blood pressure 128/69, pulse 78, temperature 97.8 F (36.6 C), temperature source Oral, resp. rate 18, height 5' (1.524 m), weight 57.3 kg (126 lb 5.2 oz), SpO2 93.00%. She looks systemically well. She is not toxic or septic. Heart sounds are present and normal. Lung fields are clear. Left lower leg cellulitis is improving.   Investigations:     Basic Metabolic Panel:  Basename 07/25/12 0701 07/24/12 2237  NA 136 134*  K 4.2 4.0  CL 100 96  CO2 29 29  GLUCOSE 95 113*  BUN 12 15  CREATININE 0.73 0.82  CALCIUM 9.3 10.1  MG -- --  PHOS -- --   Liver Function Tests:  [facility] 07/24/12 2237  AST 20  ALT 12  ALKPHOS 60  BILITOT 0.3  PROT 7.8  ALBUMIN 4.1     CBC:  Basename 07/25/12 0701 07/24/12 2237  WBC 6.0 7.1  NEUTROABS -- 4.1  HGB 12.0 12.2  HCT 36.2 36.9  MCV 90.7 91.3  PLT 258 268    Dg Chest 2 View  07/25/2012  *RADIOLOGY REPORT*  Clinical Data: Lower extremity swelling  CHEST - 2 VIEW  Comparison: None.  Findings: Moderate cardiomegaly.  Interstitial prominence. Bronchitic changes.  Costochondral calcifications are noted. Apical pleural thickening is irregular with symmetrical.  Kyphosis of the spine with osteopenia.  No pneumothorax and no pleural effusion.  Increased AP diameter the chest.  Hyperaeration.  IMPRESSION: Cardiomegaly without edema.  Changes related to COPD.  Original Report Authenticated By: Donavan Burnet, M.D.   US Venous Img Lower Bilateral  07/25/2012  *RADIOLOGY REPORT*  Clinical Data: 78 year old female with bilateral lower extremity pain and swelling.  BILATERAL LOWER EXTREMITY VENOUS DUPLEX ULTRASOUND  Technique:  Gray-scale sonography with graded compression, as well as color Doppler and duplex ultrasound, were performed to evaluate the deep venous system of both lower extremities from  the level of the common femoral vein through the popliteal and proximal calf veins.  Spectral Doppler was utilized to evaluate flow at rest and with distal augmentation maneuvers.  Comparison:  None  Findings:  Normal compressibility of bilateral common femoral, superficial femoral, and popliteal veins is demonstrated, as well as the visualized proximal calf veins.  No filling defects to suggest DVT on grayscale or color Doppler imaging.  Doppler waveforms show normal direction of venous flow, normal respiratory phasicity and response to augmentation.  Subcutaneous edema bilaterally is identified.  IMPRESSION: No evidence of deep vein thrombosis in either lower extremity.  Subcutaneous edema  Original Report Authenticated By: Rosendo Gros, M.D.      Medications: I have reviewed the patient's current medications.  Impression: 1. Cellulitis of the left leg, improving. 2. Hypothyroidism, significantly elevated TSH. 3. Glaucoma. 4. GERD. 5. Tinea pedis.     Plan: 1. Increase levothyroxine to 100 mcg daily. 2. Continue with intravenous antibiotics.     LOS: 2 days   Wilson Singer Pager [phone]  07/26/2012, 10:11 AM

## 2012-07-26 NOTE — Progress Notes (Signed)
Per patient & niece (a retired Engineer, civil (consulting)) she stopped taking Synthroid about 2-3 months ago because her physician moved away and she couldn't get it refilled. TSH slightly elevated today. As discussed with Dr Karilyn Cota will resume previous home dose of daily.  Junita Push, PharmD, BCPS 07/26/2012@10 :29 AM

## 2012-07-27 MED ORDER — SODIUM CHLORIDE 0.9 % IJ SOLN
INTRAMUSCULAR | Status: AC
  Administered 2012-07-27: 10 mL
  Filled 2012-07-27: qty 3

## 2012-07-27 NOTE — Care Management Note (Signed)
    Page 1 of 1   07/28/2012     1:57:35 PM   CARE MANAGEMENT NOTE 07/28/2012  Patient:  Tina Lane, Tina Lane   Account Number:  [id number]  Date Initiated:  07/27/2012  Documentation initiated by:  Rosemary Holms  Subjective/Objective Assessment:   Pt admitted from home where she lives alone and is independent with ADL. Her son and daughter in law are very supportive.     Action/Plan:   Spoke to pt ans Son at bedside. Pt has cellulitis on her leg and would like to have HH RN at DC come by to evaluate healing. CM will let MD know.   Anticipated DC Date:  07/28/2012   Anticipated DC Plan:  HOME W HOME HEALTH SERVICES      DC Planning Services  CM consult      Choice offered to / List presented to:  C-4 Adult Children        HH arranged  HH-1 RN  HH-10 DISEASE MANAGEMENT      HH agency  [facility]   Status of service:  Completed, signed off Medicare Important Message given?   (If response is "NO", the following Medicare IM given date fields will be blank) Date Medicare IM given:   Date Additional Medicare IM given:    Discharge Disposition:  HOME W HOME HEALTH SERVICES  Per UR Regulation:    If discussed at Long Length of Stay Meetings, dates discussed:    Comments:  07/28/12 1400 Fallon Haecker Leanord Hawking RN BSN CM [facility] notified. [facility] RN ordered.  07/27/12 1330 Braxtyn Bojarski Leanord Hawking RN BSN CM Son selected [facility] for [facility] RN.

## 2012-07-27 NOTE — Progress Notes (Signed)
     Subjective: This lady is left lower leg cellulitis is clearly improving, slowly now. She has not mobilized significantly.           Physical Exam: Blood pressure 114/69, pulse 73, temperature 97.6 F (36.4 C), temperature source Oral, resp. rate 16, height 5' (1.524 m), weight 58.2 kg (128 lb 4.9 oz), SpO2 95.00%. She looks systemically well. She is not toxic or septic. Heart sounds are present and normal. Lung fields are clear. Left lower leg cellulitis is about the same as it was yesterday.   Investigations:     Basic Metabolic Panel:  Basename 07/25/12 0701 07/24/12 2237  NA 136 134*  K 4.2 4.0  CL 100 96  CO2 29 29  GLUCOSE 95 113*  BUN 12 15  CREATININE 0.73 0.82  CALCIUM 9.3 10.1  MG -- --  PHOS -- --   Liver Function Tests:  [facility] 07/24/12 2237  AST 20  ALT 12  ALKPHOS 60  BILITOT 0.3  PROT 7.8  ALBUMIN 4.1     CBC:  Basename 07/25/12 0701 07/24/12 2237  WBC 6.0 7.1  NEUTROABS -- 4.1  HGB 12.0 12.2  HCT 36.2 36.9  MCV 90.7 91.3  PLT 258 268    US Venous Img Lower Bilateral  07/25/2012  *RADIOLOGY REPORT*  Clinical Data: 78 year old female with bilateral lower extremity pain and swelling.  BILATERAL LOWER EXTREMITY VENOUS DUPLEX ULTRASOUND  Technique:  Gray-scale sonography with graded compression, as well as color Doppler and duplex ultrasound, were performed to evaluate the deep venous system of both lower extremities from the level of the common femoral vein through the popliteal and proximal calf veins.  Spectral Doppler was utilized to evaluate flow at rest and with distal augmentation maneuvers.  Comparison:  None  Findings:  Normal compressibility of bilateral common femoral, superficial femoral, and popliteal veins is demonstrated, as well as the visualized proximal calf veins.  No filling defects to suggest DVT on grayscale or color Doppler imaging.  Doppler waveforms show normal direction of venous flow, normal respiratory  phasicity and response to augmentation.  Subcutaneous edema bilaterally is identified.  IMPRESSION: No evidence of deep vein thrombosis in either lower extremity.  Subcutaneous edema  Original Report Authenticated By: Rosendo Gros, M.D.      Medications: I have reviewed the patient's current medications.  Impression: 1. Cellulitis of the left leg, improving. 2. Hypothyroidism, significantly elevated TSH. 3. Glaucoma. 4. GERD. 5. Tinea pedis.     Plan: 1. Continue with intravenous antibiotics. 2. I think that if she is stable tomorrow and there is some improvement in the cellulitis, she should be able to be discharged with a further 10 days of antibiotics orally.     LOS: 3 days   Wilson Singer Pager [phone]  07/27/2012, 8:26 AM

## 2012-07-27 NOTE — Progress Notes (Signed)
UR Chart Review Completed  

## 2012-07-28 LAB — BASIC METABOLIC PANEL
BUN: 14 mg/dL (ref 6–23)
CO2: 25 mEq/L (ref 19–32)
Chloride: 103 mEq/L (ref 96–112)
GFR calc Af Amer: 61 mL/min — ABNORMAL LOW (ref >90)
Potassium: 3.9 mEq/L (ref 3.5–5.1)

## 2012-07-28 LAB — CBC
HCT: 38.2 % (ref 36.0–46.0)
RBC: 4.22 MIL/uL (ref 3.87–5.11)
RDW: 14.3 % (ref 11.5–15.5)
WBC: 5.8 10*3/uL (ref 4.0–10.5)

## 2012-07-28 MED ORDER — ENOXAPARIN SODIUM 30 MG/0.3ML ~~LOC~~ SOLN: 30.0000 mg | Freq: Every day | SUBCUTANEOUS | Status: DC

## 2012-07-28 MED ORDER — CLOTRIMAZOLE 1 % EX CREA: TOPICAL_CREAM | Freq: Two times a day (BID) | CUTANEOUS | Status: AC

## 2012-07-28 MED ORDER — DOXYCYCLINE HYCLATE 100 MG PO TABS: 100.0000 mg | ORAL_TABLET | Freq: Two times a day (BID) | ORAL | Status: AC

## 2012-07-28 MED ORDER — SENNOSIDES-DOCUSATE SODIUM 8.6-50 MG PO TABS: 1.0000 | ORAL_TABLET | Freq: Every evening | ORAL | Status: AC | PRN

## 2012-07-28 MED ORDER — LEVOTHYROXINE SODIUM 50 MCG PO TABS: 50.0000 ug | ORAL_TABLET | Freq: Every day | ORAL | Status: DC

## 2012-07-28 NOTE — Progress Notes (Signed)
Pt and son provided with discharge instructions and prescriptions.  Both verbalized understanding of d/c instructions.  Pt's IV removed.  Pt tolerated well.

## 2012-07-28 NOTE — Discharge Summary (Signed)
Physician Discharge Summary  Tina Lane ZOX:[id number] DOB: Mar 07, 1912 DOA: 07/24/2012  PCP: No primary provider on file.  Admit date: 07/24/2012 Discharge date: 07/28/2012  Recommendations for Outpatient Follow-up:  1. Follow up with primary doctor in 2 weeks  Discharge Diagnoses:  Active Problems:  Cellulitis of left leg  Hypothyroidism  Glaucoma  Tinea pedis  GERD (gastroesophageal reflux disease)   Discharge Condition: improved  Diet recommendation: low salt  Wt Readings from Last 3 Encounters:  07/28/12 58.4 kg (128 lb 12 oz)  08/07/11 54.432 kg (120 lb)    History of present illness:  Tina Lane is an 78 y.o. female. Elderly Caucasian lady maintains a relatively good health, but does not visit physicians often, lives alone and is independent with all activities of daily living, noted progressive swelling and redness and itching of the right leg for one week. She has chronic vasculopathy of both legs so they both tend to be dark, but she has noted little change on the right.  There is no history of trauma, she had no fevers nor chills.  He denies chest pain shortness of breath or hemoptysis.  She has not been taking antibiotics.  Her son is away on business, but her daughter-in-law visited her today and noted the status of her leg and she was brought to the emergency room for further evaluation.   Hospital Course:  This lady was admitted to the hospital with left leg swelling, redness, itching. It was felt that she had a cellulitis of her extremity. She was started on empiric antibiotics. Her hospital course her cellulitis has significantly improved. Swelling has significantly decreased. She has a normal WBC count and has not had any fevers. There is likely a component of venous stasis here. He was recommended that she keep her legs elevated while sitting and laying down. The patient will be discharged home today and have home health set up to monitor her cellulitis. She  was recommended to followup with her primary care doctor in 2 weeks.  Procedures:  none  Consultations:  none  Discharge Exam: Filed Vitals:   07/28/12 0942  BP: 113/65  Pulse: 82  Temp: 97.5 F (36.4 C)  Resp: 18   Filed Vitals:   07/27/12 2123 07/28/12 0228 07/28/12 0447 07/28/12 0942  BP: 145/77 135/67 129/69 113/65  Pulse: 104 79 80 82  Temp: 98.4 F (36.9 C) 98.5 F (36.9 C) 98.1 F (36.7 C) 97.5 F (36.4 C)  TempSrc:    Oral  Resp: 18 18 18 18   Height:      Weight:   58.4 kg (128 lb 12 oz)   SpO2:  94% 94% 95%    General: NAD, sitting up in bed Cardiovascular: s1, s2, rrr Respiratory: cta b Skin: left leg cellulitis improving  Discharge Instructions  Discharge Orders    Future Orders Please Complete By Expires   Diet - low sodium heart healthy      Home Health      Questions: Responses:   To provide the following care/treatments RN   Face-to-face encounter      Comments:   I Gonsalo Cuthbertson certify that this patient is under my care and that I, or a nurse practitioner or physician's assistant working with me, had a face-to-face encounter that meets the physician face-to-face encounter requirements with this patient on 07/28/2012.   Questions: Responses:   The encounter with the patient was in whole, or in part, for the following medical condition, which is the  primary reason for home health care cellulitis   I certify that, based on my findings, the following services are medically necessary home health services Nursing   My clinical findings support the need for the above services Complex treatment plan/patient with lack knowledge disease process and treatment   Further, I certify that my clinical findings support that this patient is homebound (i.e. absences from home require considerable and taxing effort and are for medical reasons or religious services or infrequently or of short duration when for other reasons) Ambulates short distances less than 300  feet   To provide the following care/treatments RN   Increase activity slowly      Call MD for:  temperature >100.4      Call MD for:  redness, tenderness, or signs of infection (pain, swelling, redness, odor or green/yellow discharge around incision site)      Discharge instructions      Comments:   Keep legs elevated at all times when sitting/lying down     Medication List  As of 07/28/2012  1:01 PM   TAKE these medications         CALCIUM 600 + D PO   Take 1 tablet by mouth daily.      calcium carbonate 750 MG chewable tablet   Commonly known as: TUMS EX   Chew 1 tablet by mouth at bedtime.      clotrimazole 1 % cream   Commonly known as: LOTRIMIN   Apply topically 2 (two) times daily. To left leg/foot      doxycycline 100 MG tablet   Commonly known as: VIBRA-TABS   Take 1 tablet (100 mg total) by mouth 2 (two) times daily.      levothyroxine 50 MCG tablet   Commonly known as: SYNTHROID, LEVOTHROID   Take 1 tablet (50 mcg total) by mouth daily.      polycarbophil 625 MG tablet   Commonly known as: FIBERCON   Take 625 mg by mouth at bedtime.      senna-docusate 8.6-50 MG per tablet   Commonly known as: Senokot-S   Take 1 tablet by mouth at bedtime as needed.      timolol 0.5 % ophthalmic solution   Commonly known as: BETIMOL   Place 1 drop into both eyes at bedtime.      Vitamin D3 1000 UNITS Caps   Take 1 capsule by mouth daily.              The results of significant diagnostics from this hospitalization (including imaging, microbiology, ancillary and laboratory) are listed below for reference.    Significant Diagnostic Studies: Dg Chest 2 View  07/25/2012  *RADIOLOGY REPORT*  Clinical Data: Lower extremity swelling  CHEST - 2 VIEW  Comparison: None.  Findings: Moderate cardiomegaly.  Interstitial prominence. Bronchitic changes.  Costochondral calcifications are noted. Apical pleural thickening is irregular with symmetrical.  Kyphosis of the spine with  osteopenia.  No pneumothorax and no pleural effusion.  Increased AP diameter the chest.  Hyperaeration.  IMPRESSION: Cardiomegaly without edema.  Changes related to COPD.  Original Report Authenticated By: Donavan Burnet, M.D.   US Venous Img Lower Bilateral  07/25/2012  *RADIOLOGY REPORT*  Clinical Data: 78 year old female with bilateral lower extremity pain and swelling.  BILATERAL LOWER EXTREMITY VENOUS DUPLEX ULTRASOUND  Technique:  Gray-scale sonography with graded compression, as well as color Doppler and duplex ultrasound, were performed to evaluate the deep venous system of both lower extremities from the level  of the common femoral vein through the popliteal and proximal calf veins.  Spectral Doppler was utilized to evaluate flow at rest and with distal augmentation maneuvers.  Comparison:  None  Findings:  Normal compressibility of bilateral common femoral, superficial femoral, and popliteal veins is demonstrated, as well as the visualized proximal calf veins.  No filling defects to suggest DVT on grayscale or color Doppler imaging.  Doppler waveforms show normal direction of venous flow, normal respiratory phasicity and response to augmentation.  Subcutaneous edema bilaterally is identified.  IMPRESSION: No evidence of deep vein thrombosis in either lower extremity.  Subcutaneous edema  Original Report Authenticated By: Rosendo Gros, M.D.    Microbiology: No results found for this or any previous visit (from the past 240 hour(s)).   Labs: Basic Metabolic Panel:  Lab 07/28/12 1610 07/25/12 0701 07/24/12 2237  NA 138 136 134*  K 3.9 4.2 4.0  CL 103 100 96  CO2 25 29 29   GLUCOSE 94 95 113*  BUN 14 12 15   CREATININE 0.88 0.73 0.82  CALCIUM 9.3 9.3 10.1  MG -- -- --  PHOS -- -- --   Liver Function Tests:  Lab 07/24/12 2237  AST 20  ALT 12  ALKPHOS 60  BILITOT 0.3  PROT 7.8  ALBUMIN 4.1   No results found for this basename: LIPASE:5,AMYLASE:5 in the last 168 hours No results  found for this basename: AMMONIA:5 in the last 168 hours CBC:  Lab 07/28/12 0505 07/25/12 0701 07/24/12 2237  WBC 5.8 6.0 7.1  NEUTROABS -- -- 4.1  HGB 12.6 12.0 12.2  HCT 38.2 36.2 36.9  MCV 90.5 90.7 91.3  PLT 271 258 268   Cardiac Enzymes: No results found for this basename: CKTOTAL:5,CKMB:5,CKMBINDEX:5,TROPONINI:5 in the last 168 hours BNP: BNP (last 3 results) No results found for this basename: PROBNP:3 in the last 8760 hours CBG: No results found for this basename: GLUCAP:5 in the last 168 hours  Time coordinating discharge: Greater than 30 minutes  Signed:  Takira Sherrin  [facility] 07/28/2012, 1:01 PM

## 2014-07-16 ENCOUNTER — Emergency Department (HOSPITAL_COMMUNITY): Payer: 59

## 2014-07-16 ENCOUNTER — Emergency Department (HOSPITAL_COMMUNITY)
Admission: EM | Admit: 2014-07-16 | Discharge: 2014-07-16 | Disposition: A | Discharge status: Home / Self Care | Payer: 59 | Attending: Emergency Medicine | Admitting: Emergency Medicine

## 2014-07-16 ENCOUNTER — Encounter (HOSPITAL_COMMUNITY): Payer: Self-pay | Admitting: Emergency Medicine

## 2014-07-16 DIAGNOSIS — M546 Pain in thoracic spine: Secondary | ICD-10-CM | POA: Insufficient documentation

## 2014-07-16 DIAGNOSIS — Z79899 Other long term (current) drug therapy: Secondary | ICD-10-CM | POA: Insufficient documentation

## 2014-07-16 DIAGNOSIS — E079 Disorder of thyroid, unspecified: Secondary | ICD-10-CM | POA: Insufficient documentation

## 2014-07-16 DIAGNOSIS — Z880 Allergy status to penicillin: Secondary | ICD-10-CM | POA: Insufficient documentation

## 2014-07-16 DIAGNOSIS — M7989 Other specified soft tissue disorders: Secondary | ICD-10-CM | POA: Insufficient documentation

## 2014-07-16 DIAGNOSIS — M4000 Postural kyphosis, site unspecified: Secondary | ICD-10-CM | POA: Insufficient documentation

## 2014-07-16 DIAGNOSIS — R110 Nausea: Secondary | ICD-10-CM | POA: Insufficient documentation

## 2014-07-16 DIAGNOSIS — Z7982 Long term (current) use of aspirin: Secondary | ICD-10-CM | POA: Insufficient documentation

## 2014-07-16 HISTORY — DX: Unspecified hearing loss, unspecified ear: H91.90

## 2014-07-16 LAB — CBC WITH DIFFERENTIAL/PLATELET
BASOS ABS: 0.1 10*3/uL (ref 0.0–0.1)
BASOS PCT: 1 % (ref 0–1)
EOS ABS: 0.1 10*3/uL (ref 0.0–0.7)
EOS PCT: 1 % (ref 0–5)
HEMATOCRIT: 37.5 % (ref 36.0–46.0)
HEMOGLOBIN: 12.6 g/dL (ref 12.0–15.0)
Lymphocytes Relative: 29 % (ref 12–46)
Lymphs Abs: 1.6 10*3/uL (ref 0.7–4.0)
MCH: 30.7 pg (ref 26.0–34.0)
MCHC: 33.6 g/dL (ref 30.0–36.0)
MCV: 91.5 fL (ref 78.0–100.0)
MONO ABS: 0.4 10*3/uL (ref 0.1–1.0)
MONOS PCT: 7 % (ref 3–12)
NEUTROS ABS: 3.5 10*3/uL (ref 1.7–7.7)
Neutrophils Relative %: 62 % (ref 43–77)
Platelets: 248 10*3/uL (ref 150–400)
RBC: 4.1 MIL/uL (ref 3.87–5.11)
RDW: 13.9 % (ref 11.5–15.5)
WBC: 5.6 10*3/uL (ref 4.0–10.5)

## 2014-07-16 LAB — BASIC METABOLIC PANEL
ANION GAP: 11 (ref 5–15)
BUN: 14 mg/dL (ref 6–23)
CALCIUM: 10.1 mg/dL (ref 8.4–10.5)
CO2: 28 mEq/L (ref 19–32)
CREATININE: 0.8 mg/dL (ref 0.50–1.10)
Chloride: 93 mEq/L — ABNORMAL LOW (ref 96–112)
GFR, EST AFRICAN AMERICAN: 68 mL/min — AB (ref >90)
GFR, EST NON AFRICAN AMERICAN: 59 mL/min — AB (ref >90)
Glucose, Bld: 92 mg/dL (ref 70–99)
Potassium: 4.4 mEq/L (ref 3.7–5.3)
Sodium: 132 mEq/L — ABNORMAL LOW (ref 137–147)

## 2014-07-16 LAB — TROPONIN I: Troponin I: <0.3 ng/mL (ref <0.30)

## 2014-07-16 LAB — D-DIMER, QUANTITATIVE (NOT AT ARMC): D DIMER QUANT: 0.51 ug{FEU}/mL — AB (ref 0.00–0.48)

## 2014-07-16 MED ORDER — IBUPROFEN 100 MG/5ML PO SUSP
200.0000 mg | Freq: Once | ORAL | Status: AC
  Administered 2014-07-16: 200 mg via ORAL
  Filled 2014-07-16: qty 10

## 2014-07-16 MED ORDER — ACYCLOVIR 400 MG PO TABS: 400.0000 mg | ORAL_TABLET | Freq: Every day | ORAL | Status: DC

## 2014-07-16 MED ORDER — TRAMADOL HCL 50 MG PO TABS: 50.0000 mg | ORAL_TABLET | Freq: Four times a day (QID) | ORAL | Status: DC | PRN

## 2014-07-16 NOTE — Discharge Instructions (Signed)
Take the tramadol as needed for the back pain. Possible with this pain pattern could be developing shingles. But not obvious at this point in time. Prescription for acyclovir provided if the rash breaks out contact her primary care Dr. for confirmation before starting it. Return for any newer worse symptoms.

## 2014-07-16 NOTE — ED Notes (Signed)
Pt c/o upper back pain located between the shoulder blades that started a few days ago, pain is associated with nausea at times, denies any SOB, vomiting.

## 2014-07-16 NOTE — ED Provider Notes (Signed)
CSN: [id number]     Arrival date & time 07/16/14  1400 History   First MD Initiated Contact with Patient 07/16/14 1435     This chart was scribed for Vanetta Mulders, MD by Arlan Organ, ED Scribe. This patient was seen in room APA15/APA15 and the patient's care was started 2:44 PM.   Chief Complaint  Patient presents with  . Back Pain   Patient is a 80 y.o. female presenting with back pain. The history is provided by the patient and a relative. No language interpreter was used.  Back Pain Location:  Generalized Radiates to:  Does not radiate Pain severity:  Mild Pain is:  Same all the time Duration:  3 days Timing:  Constant Progression:  Unchanged Chronicity:  New Relieved by:  Nothing Associated symptoms: no abdominal pain, no chest pain, no dysuria, no fever and no headaches     HPI Comments: RUDY DOMEK is a 80 y.o. female with a PMHx of Thyroid disease and GERD who presents to the Emergency Department complaining of constant, moderate back pain between the shoulder blades x 3 days that is unchanged. Son states she has a history of collapsed vertebrae and feels this may me associated with discomfort after recent activity around the house. States he gave her a baby aspirin to help manage symptoms. Reports some nausea after taking aspirin. At this time she denies any fever, chills, cough, SOB, CP, dysuria, abdominal pain, rash, or vomiting. Pt is eating and drinking as normal. She has no other pertinent past medical history. No other concerns this visit.   She is followed by Dr. Samuel Jester PCP- [facility]  Past Medical History  Diagnosis Date  . Glaucoma   . Thyroid disease   . GERD (gastroesophageal reflux disease)   . HOH (hard of hearing)    Past Surgical History  Procedure Laterality Date  . Abdominal surgery     Family History  Problem Relation Age of Onset  . Cancer Sister     unknown  . Cancer Sister     unknown  . Coronary artery disease Father   .  Cancer Mother     died age 16   History  Substance Use Topics  . Smoking status: Never Smoker   . Smokeless tobacco: Never Used  . Alcohol Use: No   OB History   Grav Para Term Preterm Abortions TAB SAB Ect Mult Living                 Review of Systems  Constitutional: Negative for fever and chills.  HENT: Negative for rhinorrhea and sore throat.   Eyes: Negative for visual disturbance.  Respiratory: Negative for cough and shortness of breath.   Cardiovascular: Positive for leg swelling. Negative for chest pain.  Gastrointestinal: Positive for nausea. Negative for vomiting, abdominal pain and diarrhea.  Genitourinary: Negative for dysuria.  Musculoskeletal: Positive for back pain. Negative for joint swelling and neck pain.  Skin: Negative for rash.  Neurological: Negative for headaches.  Hematological: Does not bruise/bleed easily.  Psychiatric/Behavioral: Negative for confusion.      Allergies  Onion; Other; Penicillins; and Sulfa antibiotics  Home Medications   Prior to Admission medications   Medication Sig Start Date End Date Taking? Authorizing Provider  aspirin EC 81 MG tablet Take 81 mg by mouth as needed (chest pain).   Yes Historical Provider, MD  calcium carbonate (TUMS EX) 750 MG chewable tablet Chew 1 tablet by mouth at bedtime.  Yes Historical Provider, MD  Calcium Carbonate-Vitamin D (CALCIUM 600 + D PO) Take 1 tablet by mouth daily.     Yes Historical Provider, MD  Cholecalciferol (VITAMIN D3) 1000 UNITS CAPS Take 1 capsule by mouth daily.     Yes Historical Provider, MD  levothyroxine (SYNTHROID, LEVOTHROID) 50 MCG tablet Take 1 tablet (50 mcg total) by mouth daily. 07/28/12  Yes Erick BlinksJehanzeb Memon, MD  polycarbophil (FIBERCON) 625 MG tablet Take 625 mg by mouth at bedtime.   Yes Historical Provider, MD  timolol (BETIMOL) 0.5 % ophthalmic solution Place 1 drop into both eyes at bedtime.     Yes Historical Provider, MD  acyclovir (ZOVIRAX) 400 MG tablet Take 1  tablet (400 mg total) by mouth 5 (five) times daily. 07/16/14   Vanetta MuldersScott Tenae Graziosi, MD  traMADol (ULTRAM) 50 MG tablet Take 1 tablet (50 mg total) by mouth every 6 (six) hours as needed. 07/16/14   Vanetta MuldersScott Mizraim Harmening, MD   Triage Vitals: BP 170/64  Pulse 98  Temp(Src) 98.1 F (36.7 C) (Oral)  Resp 22  Ht 5' (1.524 m)  Wt 120 lb (54.432 kg)  BMI 23.44 kg/m2  SpO2 98%   Physical Exam  Nursing note and vitals reviewed. Constitutional: She is oriented to person, place, and time. She appears well-developed and well-nourished. No distress.  HENT:  Head: Normocephalic and atraumatic.  Eyes: EOM are normal.  Neck: Normal range of motion.  Cardiovascular: Normal rate, regular rhythm and normal heart sounds.   Pulmonary/Chest: Effort normal and breath sounds normal.  Abdominal: Soft. She exhibits no distension. There is no tenderness.  Musculoskeletal: Normal range of motion. She exhibits no edema.  Kyphosis of the back  Neurological: She is alert and oriented to person, place, and time.  Skin: Skin is warm and dry.  Psychiatric: She has a normal mood and affect. Judgment normal.    ED Course  Procedures (including critical care time)  DIAGNOSTIC STUDIES: Oxygen Saturation is 98% on RA, Normal by my interpretation.    COORDINATION OF CARE: 2:43 PM- Will order EKG, X-Rays. Discussed treatment plan with pt at bedside and pt agreed to plan.     Labs Review Labs Reviewed  D-DIMER, QUANTITATIVE - Abnormal; Notable for the following:    D-Dimer, Quant 0.51 (*)    All other components within normal limits  BASIC METABOLIC PANEL - Abnormal; Notable for the following:    Sodium 132 (*)    Chloride 93 (*)    GFR calc non Af Amer 59 (*)    GFR calc Af Amer 68 (*)    All other components within normal limits  TROPONIN I  CBC WITH DIFFERENTIAL   Results for orders placed during the hospital encounter of 07/16/14  TROPONIN I      Result Value Ref Range   Troponin I <0.30  <0.30 ng/mL   D-DIMER, QUANTITATIVE      Result Value Ref Range   D-Dimer, Quant 0.51 (*) 0.00 - 0.48 ug/mL-FEU  CBC WITH DIFFERENTIAL      Result Value Ref Range   WBC 5.6  4.0 - 10.5 K/uL   RBC 4.10  3.87 - 5.11 MIL/uL   Hemoglobin 12.6  12.0 - 15.0 g/dL   HCT 84.137.5  66.036.0 - 63.046.0 %   MCV 91.5  78.0 - 100.0 fL   MCH 30.7  26.0 - 34.0 pg   MCHC 33.6  30.0 - 36.0 g/dL   RDW 16.013.9  10.911.5 - 32.315.5 %   Platelets  248  150 - 400 K/uL   Neutrophils Relative % 62  43 - 77 %   Neutro Abs 3.5  1.7 - 7.7 K/uL   Lymphocytes Relative 29  12 - 46 %   Lymphs Abs 1.6  0.7 - 4.0 K/uL   Monocytes Relative 7  3 - 12 %   Monocytes Absolute 0.4  0.1 - 1.0 K/uL   Eosinophils Relative 1  0 - 5 %   Eosinophils Absolute 0.1  0.0 - 0.7 K/uL   Basophils Relative 1  0 - 1 %   Basophils Absolute 0.1  0.0 - 0.1 K/uL  BASIC METABOLIC PANEL      Result Value Ref Range   Sodium 132 (*) 137 - 147 mEq/L   Potassium 4.4  3.7 - 5.3 mEq/L   Chloride 93 (*) 96 - 112 mEq/L   CO2 28  19 - 32 mEq/L   Glucose, Bld 92  70 - 99 mg/dL   BUN 14  6 - 23 mg/dL   Creatinine, Ser 1.61  0.50 - 1.10 mg/dL   Calcium 09.6  8.4 - 04.5 mg/dL   GFR calc non Af Amer 59 (*) >90 mL/min   GFR calc Af Amer 68 (*) >90 mL/min   Anion gap 11  5 - 15     Imaging Review Dg Chest 2 View  07/16/2014   CLINICAL DATA:  Upper back pain.  No injury.  EXAM: CHEST  2 VIEW  COMPARISON:  07/24/2012.  FINDINGS: Bony thorax is diffusely demineralized. No thoracic fracture is evident.  Cardiac silhouette is mildly enlarged. No mediastinal or hilar masses.  Lungs are hyperexpanded. There are stable areas of lung scarring. No lung consolidation or edema. No pleural effusion or pneumothorax.  IMPRESSION: No acute cardiopulmonary disease.   Electronically Signed   By: Amie Portland M.D.   On: 07/16/2014 15:42   Dg Thoracic Spine 2 View  07/16/2014   CLINICAL DATA:  Back pain.  EXAM: THORACIC SPINE - 2 VIEW  COMPARISON:  Lateral chest radiograph, 07/24/2012  FINDINGS: Bones  are diffusely demineralized. There is no fracture or spondylolisthesis. There are mild disc degenerative changes throughout the mid thoracic spine as well as an accentuated thoracic kyphosis. There is a mild curvature of the thoracic spine, convex to the left in the upper thoracic spine and to the right at the thoracolumbar junction.  There is mild loss of vertebral body height at L1 consistent with a mild compression fracture. This is old.  Soft tissues are unremarkable.  IMPRESSION: No acute fracture or acute finding.   Electronically Signed   By: Amie Portland M.D.   On: 07/16/2014 15:43     EKG Interpretation   Date/Time:  Saturday July 16 2014 14:07:36 EDT Ventricular Rate:  82 PR Interval:  146 QRS Duration: 70 QT Interval:  354 QTC Calculation: 413 R Axis:   31 Text Interpretation:  Normal sinus rhythm Normal ECG No previous ECGs  available Confirmed by Ananda Caya  MD, Rhaelyn Giron ([id number]) on 07/16/2014 3:11:38  PM      MDM   Final diagnoses:  Left-sided thoracic back pain    Patient with complaint of back pain between the shoulder blades initially for the past 3 days. On further conversations with the patient it seems like is predominantly left-sided. Upper round the T4 area there are 2 red spots no vesicles. Possible this could be early shingles. Not obvious at this point in time. In addition additional workup chest x-ray  showed no abnormalities. Thoracic spine showed no compression fractures. EKG and troponin are both negative no evidence of acute cardiac event as the family was worried about. Patient's d-dimer was slightly elevated was not worried about a PE this was done more for concerns for dissection with a negative chest x-ray and that only being a 0.51, with 0.48 normal doubt that that is the case. Patient clinically does not seem to fit that. Plus now we know the pain is more to the left side and can't heads towards the left shoulder blade. Clinically this may very well be shingles  as mentioned above. Patient be sent home with a cycle. If the rash gets worse to call primary care doctor for permission to start acyclovir. In the meantime we'll treat with tramadol. Patient is nontoxic no acute distress. In very good shape for her age mentally very intact. His heart appearing.  I personally performed the services described in this documentation, which was scribed in my presence. The recorded information has been reviewed and is accurate.    Vanetta Mulders, MD 07/16/14 1747

## 2014-08-08 ENCOUNTER — Inpatient Hospital Stay (HOSPITAL_COMMUNITY)
Admission: EM | Admit: 2014-08-08 | Discharge: 2014-08-11 | DRG: 603 | Disposition: A | Discharge status: Home Health | Payer: 59 | Attending: Internal Medicine | Admitting: Internal Medicine

## 2014-08-08 ENCOUNTER — Encounter (HOSPITAL_COMMUNITY): Payer: Self-pay | Admitting: Emergency Medicine

## 2014-08-08 DIAGNOSIS — H409 Unspecified glaucoma: Secondary | ICD-10-CM | POA: Diagnosis present

## 2014-08-08 DIAGNOSIS — E039 Hypothyroidism, unspecified: Secondary | ICD-10-CM | POA: Diagnosis present

## 2014-08-08 DIAGNOSIS — I82819 Embolism and thrombosis of superficial veins of unspecified lower extremities: Secondary | ICD-10-CM | POA: Diagnosis present

## 2014-08-08 DIAGNOSIS — I8290 Acute embolism and thrombosis of unspecified vein: Secondary | ICD-10-CM | POA: Diagnosis present

## 2014-08-08 DIAGNOSIS — M7989 Other specified soft tissue disorders: Secondary | ICD-10-CM | POA: Diagnosis present

## 2014-08-08 DIAGNOSIS — K219 Gastro-esophageal reflux disease without esophagitis: Secondary | ICD-10-CM | POA: Diagnosis present

## 2014-08-08 DIAGNOSIS — I872 Venous insufficiency (chronic) (peripheral): Secondary | ICD-10-CM

## 2014-08-08 DIAGNOSIS — L03119 Cellulitis of unspecified part of limb: Secondary | ICD-10-CM

## 2014-08-08 DIAGNOSIS — I10 Essential (primary) hypertension: Secondary | ICD-10-CM

## 2014-08-08 DIAGNOSIS — L02419 Cutaneous abscess of limb, unspecified: Principal | ICD-10-CM | POA: Diagnosis present

## 2014-08-08 DIAGNOSIS — H9190 Unspecified hearing loss, unspecified ear: Secondary | ICD-10-CM | POA: Diagnosis present

## 2014-08-08 DIAGNOSIS — L0390 Cellulitis, unspecified: Secondary | ICD-10-CM | POA: Diagnosis present

## 2014-08-08 LAB — CBC WITH DIFFERENTIAL/PLATELET
BASOS ABS: 0.1 10*3/uL (ref 0.0–0.1)
BASOS PCT: 1 % (ref 0–1)
EOS ABS: 0.2 10*3/uL (ref 0.0–0.7)
Eosinophils Relative: 4 % (ref 0–5)
HCT: 35 % — ABNORMAL LOW (ref 36.0–46.0)
HEMOGLOBIN: 11.8 g/dL — AB (ref 12.0–15.0)
Lymphocytes Relative: 28 % (ref 12–46)
Lymphs Abs: 1.5 10*3/uL (ref 0.7–4.0)
MCH: 31.1 pg (ref 26.0–34.0)
MCHC: 33.7 g/dL (ref 30.0–36.0)
MCV: 92.3 fL (ref 78.0–100.0)
MONOS PCT: 12 % (ref 3–12)
Monocytes Absolute: 0.7 10*3/uL (ref 0.1–1.0)
NEUTROS ABS: 3.1 10*3/uL (ref 1.7–7.7)
NEUTROS PCT: 55 % (ref 43–77)
Platelets: 274 10*3/uL (ref 150–400)
RBC: 3.79 MIL/uL — ABNORMAL LOW (ref 3.87–5.11)
RDW: 14 % (ref 11.5–15.5)
WBC: 5.6 10*3/uL (ref 4.0–10.5)

## 2014-08-08 LAB — BASIC METABOLIC PANEL
ANION GAP: 10 (ref 5–15)
BUN: 18 mg/dL (ref 6–23)
CHLORIDE: 95 meq/L — AB (ref 96–112)
CO2: 28 mEq/L (ref 19–32)
CREATININE: 0.94 mg/dL (ref 0.50–1.10)
Calcium: 9.9 mg/dL (ref 8.4–10.5)
GFR, EST AFRICAN AMERICAN: 56 mL/min — AB (ref >90)
GFR, EST NON AFRICAN AMERICAN: 48 mL/min — AB (ref >90)
Glucose, Bld: 113 mg/dL — ABNORMAL HIGH (ref 70–99)
POTASSIUM: 4.5 meq/L (ref 3.7–5.3)
Sodium: 133 mEq/L — ABNORMAL LOW (ref 137–147)

## 2014-08-08 MED ORDER — CLINDAMYCIN HCL 300 MG PO CAPS
300.0000 mg | ORAL_CAPSULE | Freq: Once | ORAL | Status: AC
  Administered 2014-08-08: 300 mg via ORAL
  Filled 2014-08-08: qty 1

## 2014-08-08 MED ORDER — ENOXAPARIN SODIUM 100 MG/ML ~~LOC~~ SOLN
54.0000 mg | Freq: Once | SUBCUTANEOUS | Status: AC
  Administered 2014-08-08: 55 mg via SUBCUTANEOUS
  Filled 2014-08-08: qty 1

## 2014-08-08 NOTE — ED Notes (Signed)
MD at bedside speaking to pt at this time  

## 2014-08-08 NOTE — ED Provider Notes (Signed)
CSN: [id number]     Arrival date & time 08/08/14  1920 History   First MD Initiated Contact with Patient 08/08/14 1948     Chief Complaint  Patient presents with  . Leg Swelling     (Consider location/radiation/quality/duration/timing/severity/associated sxs/prior Treatment) Patient is a 80 y.o. female presenting with leg pain.  Leg Pain Lower extremity pain location: bil le distal to knee. Injury: no   Pain details:    Quality:  Aching   Radiates to:  Does not radiate   Severity:  Moderate   Onset quality:  Gradual   Duration:  3 days   Timing:  Constant   Progression:  Worsening Chronicity:  Recurrent Relieved by:  Elevation and compression Worsened by:  Bearing weight Ineffective treatments:  None tried Associated symptoms: swelling   Associated symptoms: no decreased ROM, no muscle weakness, no numbness and no tingling     Past Medical History  Diagnosis Date  . Glaucoma   . Thyroid disease   . GERD (gastroesophageal reflux disease)   . HOH (hard of hearing)    Past Surgical History  Procedure Laterality Date  . Abdominal surgery     Family History  Problem Relation Age of Onset  . Cancer Sister     unknown  . Cancer Sister     unknown  . Coronary artery disease Father   . Cancer Mother     died age 78   History  Substance Use Topics  . Smoking status: Never Smoker   . Smokeless tobacco: Never Used  . Alcohol Use: No   OB History   Grav Para Term Preterm Abortions TAB SAB Ect Mult Living                 Review of Systems  Unable to perform ROS: Other  Eyes: Negative for photophobia and visual disturbance.  Respiratory: Negative for cough, chest tightness and shortness of breath.   Cardiovascular: Positive for leg swelling. Negative for chest pain.  Gastrointestinal: Negative for nausea, vomiting, abdominal pain, diarrhea and constipation.  Skin: Positive for color change. Negative for rash.      Allergies  Onion; Other; Penicillins; and  Sulfa antibiotics  Home Medications   Prior to Admission medications   Medication Sig Start Date End Date Taking? Authorizing Provider  calcium carbonate (TUMS EX) 750 MG chewable tablet Chew 1 tablet by mouth at bedtime.    Yes Historical Provider, MD  Calcium Carbonate-Vitamin D (CALCIUM 600 + D PO) Take 1 tablet by mouth daily.     Yes Historical Provider, MD  Cholecalciferol (VITAMIN D3) 1000 UNITS CAPS Take 1 capsule by mouth daily.     Yes Historical Provider, MD  levothyroxine (SYNTHROID, LEVOTHROID) 50 MCG tablet Take 1 tablet (50 mcg total) by mouth daily. 07/28/12  Yes Erick BlinksJehanzeb Memon, MD  polycarbophil (FIBERCON) 625 MG tablet Take 625 mg by mouth at bedtime.   Yes Historical Provider, MD  timolol (BETIMOL) 0.5 % ophthalmic solution Place 1 drop into both eyes at bedtime.     Yes Historical Provider, MD  aspirin EC 81 MG tablet Take 81 mg by mouth as needed (chest pain).    Historical Provider, MD   BP 170/91  Pulse 82  Temp(Src) 98.3 F (36.8 C) (Oral)  Resp 20  Wt 120 lb (54.432 kg)  SpO2 100% Physical Exam  Vitals reviewed. Constitutional: She is oriented to person, place, and time. She appears well-developed and well-nourished.  HENT:  Head: Normocephalic and atraumatic.  Right Ear: External ear normal.  Left Ear: External ear normal.  Eyes: Conjunctivae and EOM are normal. Pupils are equal, round, and reactive to light.  Neck: Normal range of motion. Neck supple.  Cardiovascular: Normal rate, regular rhythm, normal heart sounds and intact distal pulses.   Pulmonary/Chest: Effort normal and breath sounds normal.  Abdominal: Soft. Bowel sounds are normal. There is no tenderness.  Musculoskeletal: Normal range of motion.  bil 3+ PE of LE distal to knee with circum. Erythema and warmth.  Pulses 1+ DP bil.  No obvious abscess.  Swelling R>>L  Neurological: She is alert and oriented to person, place, and time.  Skin: Skin is warm and dry.    ED Course  Procedures  (including critical care time) Labs Review Labs Reviewed  CBC WITH DIFFERENTIAL - Abnormal; Notable for the following:    RBC 3.79 (*)    Hemoglobin 11.8 (*)    HCT 35.0 (*)    All other components within normal limits  BASIC METABOLIC PANEL - Abnormal; Notable for the following:    Sodium 133 (*)    Chloride 95 (*)    Glucose, Bld 113 (*)    GFR calc non Af Amer 48 (*)    GFR calc Af Amer 56 (*)    All other components within normal limits    Imaging Review No results found.   EKG Interpretation None      MDM   Final diagnoses:  Leg swelling    80 y.o. female with pertinent PMH of chronic venous stasis presents with bilateral leg swelling right greater than left first noticed approximately 3 days ago. Patient has been steadily well, without fever, nausea, vomiting or other systemic symptoms. She has a recurrent history of the same however it only lasts occurrence it is left greater than right. At that time she was admitted for cellulitis. On arrival today her vital signs and physical exam as above. We'll check DVT study.  Afebrile and not tachycardic at this time so we'll defer antibiotics pending results.    Unable to obtain DVT study as this unavailable at this time.  Given venous stasis, age will admit for antibiotics, anticoagulation, and DVT study in the a.m.  Admitted in stable condition  Labs and imaging as above reviewed.   1. Leg swelling         Mirian Mo, MD 08/08/14 2312

## 2014-08-08 NOTE — ED Notes (Signed)
Pt complains of bilateral leg swelling, redness and some drainage since last Friday

## 2014-08-09 DIAGNOSIS — K219 Gastro-esophageal reflux disease without esophagitis: Secondary | ICD-10-CM | POA: Diagnosis present

## 2014-08-09 DIAGNOSIS — E039 Hypothyroidism, unspecified: Secondary | ICD-10-CM | POA: Diagnosis present

## 2014-08-09 DIAGNOSIS — H9190 Unspecified hearing loss, unspecified ear: Secondary | ICD-10-CM | POA: Diagnosis present

## 2014-08-09 DIAGNOSIS — H409 Unspecified glaucoma: Secondary | ICD-10-CM | POA: Diagnosis present

## 2014-08-09 DIAGNOSIS — L0390 Cellulitis, unspecified: Secondary | ICD-10-CM | POA: Diagnosis present

## 2014-08-09 DIAGNOSIS — M7989 Other specified soft tissue disorders: Secondary | ICD-10-CM

## 2014-08-09 DIAGNOSIS — L02419 Cutaneous abscess of limb, unspecified: Secondary | ICD-10-CM | POA: Diagnosis present

## 2014-08-09 DIAGNOSIS — I8290 Acute embolism and thrombosis of unspecified vein: Secondary | ICD-10-CM | POA: Diagnosis present

## 2014-08-09 DIAGNOSIS — I872 Venous insufficiency (chronic) (peripheral): Secondary | ICD-10-CM | POA: Diagnosis present

## 2014-08-09 DIAGNOSIS — I82819 Embolism and thrombosis of superficial veins of unspecified lower extremities: Secondary | ICD-10-CM | POA: Diagnosis present

## 2014-08-09 LAB — CBC
HCT: 36.7 % (ref 36.0–46.0)
HEMOGLOBIN: 12 g/dL (ref 12.0–15.0)
MCH: 30.5 pg (ref 26.0–34.0)
MCHC: 32.7 g/dL (ref 30.0–36.0)
MCV: 93.4 fL (ref 78.0–100.0)
Platelets: 264 10*3/uL (ref 150–400)
RBC: 3.93 MIL/uL (ref 3.87–5.11)
RDW: 14 % (ref 11.5–15.5)
WBC: 6.1 10*3/uL (ref 4.0–10.5)

## 2014-08-09 LAB — BASIC METABOLIC PANEL
Anion gap: 11 (ref 5–15)
BUN: 14 mg/dL (ref 6–23)
CO2: 28 mEq/L (ref 19–32)
Calcium: 9.7 mg/dL (ref 8.4–10.5)
Chloride: 99 mEq/L (ref 96–112)
Creatinine, Ser: 0.87 mg/dL (ref 0.50–1.10)
GFR calc Af Amer: 61 mL/min — ABNORMAL LOW (ref >90)
GFR calc non Af Amer: 53 mL/min — ABNORMAL LOW (ref >90)
Glucose, Bld: 96 mg/dL (ref 70–99)
Potassium: 4.1 mEq/L (ref 3.7–5.3)
Sodium: 138 mEq/L (ref 137–147)

## 2014-08-09 LAB — PROTIME-INR
INR: 1.02 (ref 0.00–1.49)
Prothrombin Time: 13.4 seconds (ref 11.6–15.2)

## 2014-08-09 MED ORDER — LEVOTHYROXINE SODIUM 50 MCG PO TABS
50.0000 ug | ORAL_TABLET | Freq: Every day | ORAL | Status: DC
  Administered 2014-08-09 – 2014-08-11 (×3): 50 ug via ORAL
  Filled 2014-08-09 (×4): qty 1

## 2014-08-09 MED ORDER — TIMOLOL MALEATE 0.5 % OP SOLN
1.0000 [drp] | Freq: Every day | OPHTHALMIC | Status: DC
  Administered 2014-08-09 – 2014-08-10 (×3): 1 [drp] via OPHTHALMIC
  Filled 2014-08-09: qty 5

## 2014-08-09 MED ORDER — CLINDAMYCIN PHOSPHATE 300 MG/50ML IV SOLN
300.0000 mg | Freq: Four times a day (QID) | INTRAVENOUS | Status: DC
  Administered 2014-08-09 – 2014-08-10 (×4): 300 mg via INTRAVENOUS
  Filled 2014-08-09 (×5): qty 50

## 2014-08-09 MED ORDER — SODIUM CHLORIDE 0.9 % IJ SOLN
3.0000 mL | Freq: Two times a day (BID) | INTRAMUSCULAR | Status: DC
  Administered 2014-08-09 – 2014-08-11 (×6): 3 mL via INTRAVENOUS

## 2014-08-09 MED ORDER — ACETAMINOPHEN 325 MG PO TABS: 650.0000 mg | ORAL_TABLET | Freq: Four times a day (QID) | ORAL | Status: DC | PRN

## 2014-08-09 MED ORDER — DOCUSATE SODIUM 100 MG PO CAPS
100.0000 mg | ORAL_CAPSULE | Freq: Two times a day (BID) | ORAL | Status: DC
  Administered 2014-08-09 – 2014-08-11 (×6): 100 mg via ORAL
  Filled 2014-08-09 (×7): qty 1

## 2014-08-09 MED ORDER — ACETAMINOPHEN 650 MG RE SUPP: 650.0000 mg | Freq: Four times a day (QID) | RECTAL | Status: DC | PRN

## 2014-08-09 MED ORDER — CALCIUM CARBONATE ANTACID 500 MG PO CHEW
1.0000 | CHEWABLE_TABLET | Freq: Every day | ORAL | Status: DC
  Administered 2014-08-09 – 2014-08-10 (×3): 200 mg via ORAL
  Filled 2014-08-09 (×4): qty 1

## 2014-08-09 MED ORDER — ENOXAPARIN SODIUM 30 MG/0.3ML ~~LOC~~ SOLN
30.0000 mg | SUBCUTANEOUS | Status: DC
  Administered 2014-08-09 – 2014-08-10 (×2): 30 mg via SUBCUTANEOUS
  Filled 2014-08-09 (×3): qty 0.3

## 2014-08-09 MED ORDER — ONDANSETRON HCL 4 MG PO TABS: 4.0000 mg | ORAL_TABLET | Freq: Four times a day (QID) | ORAL | Status: DC | PRN

## 2014-08-09 MED ORDER — ONDANSETRON HCL 4 MG/2ML IJ SOLN: 4.0000 mg | Freq: Four times a day (QID) | INTRAMUSCULAR | Status: DC | PRN

## 2014-08-09 MED ORDER — HYDRALAZINE HCL 20 MG/ML IJ SOLN: 5.0000 mg | Freq: Four times a day (QID) | INTRAMUSCULAR | Status: DC | PRN

## 2014-08-09 MED ORDER — ASPIRIN EC 81 MG PO TBEC
81.0000 mg | DELAYED_RELEASE_TABLET | ORAL | Status: DC | PRN
  Filled 2014-08-09: qty 1

## 2014-08-09 MED ORDER — ENOXAPARIN SODIUM 30 MG/0.3ML ~~LOC~~ SOLN
30.0000 mg | SUBCUTANEOUS | Status: DC
  Filled 2014-08-09: qty 0.3

## 2014-08-09 MED ORDER — CALCIUM POLYCARBOPHIL 625 MG PO TABS
625.0000 mg | ORAL_TABLET | Freq: Every day | ORAL | Status: DC
  Administered 2014-08-09 – 2014-08-10 (×3): 625 mg via ORAL
  Filled 2014-08-09 (×4): qty 1

## 2014-08-09 NOTE — Progress Notes (Signed)
UR Completed.  Monna Crean Jane [phone] 08/09/2014  

## 2014-08-09 NOTE — Progress Notes (Addendum)
Patient ID: Tina Lane, female   DOB: 08-09-1912, [age] y.o.   MRN: [id number]  [facility] PROGRESS NOTE  Tina Lane [id number] DOB: 08-09-1912 DOA: 08/08/2014 PCP: No primary provider on file.  Brief narrative: [age] yr old woman, fairly independent at baseline, lives alone at home, [facility] with main concern of sudden onset or right lower extremity swelling. Per son, pt has chronically darker LE's with redness from chronic stasis dermatitis but swelling is new. Pt explains, RLE is also painful, constant, 5/10 in severity and worse with ambulation. [facility] asked to admit for further evaluation and rule out DVT vs cellulitis.    Active Problems:   Right LE swelling - plan for doppler US this AM to rule out DVT - possibly related to underlying cellulitis - will continue Clindamycin IV for now - provide analgesia as needed    Hypothyroidism - continue synthroid    HTN - likely worse due to pain in RLE  - will add Hydralazine as needed for now    DVT prophylaxis - place on Lovenox subQ until venous  Doppler done   Consultants:  None   Procedures/Studies:  Venous doppler 8/11 --> pending   Antibiotics:  Clindamycin 8/10 -->  Code Status: Full Family Communication: Pt at bedside Disposition Plan: Home when medically stable  HPI/Subjective: No events overnight.   Objective: Filed Vitals:   08/08/14 2242 08/09/14 0042 08/09/14 0052 08/09/14 0630  BP: 170/91 164/71 179/69 148/69  Pulse: 82 89 80 74  Temp:   98.2 F (36.8 C) 98.1 F (36.7 C)  TempSrc:   Oral Oral  Resp: 20 18 20 18   Height:   5\' 4"  (1.626 m)   Weight:   54.1 kg (119 lb 4.3 oz)   SpO2: 100% 97% 96% 97%    Intake/Output Summary (Last 24 hours) at 08/09/14 0813 Last data filed at 08/09/14 0146  Gross per 24 hour  Intake      3 ml  Output      0 ml  Net      3 ml    Exam:   General:  Pt is alert, follows commands appropriately, not in acute distress  Cardiovascular: Regular rate  and rhythm, no rubs, no gallops  Respiratory: Clear to auscultation bilaterally, no wheezing, no crackles, no rhonchi  Abdomen: Soft, non tender, non distended, bowel sounds present, no guarding  Extremities: bilateral LE chronic venous stasis changes, RLE swelling and TTP, erythema is present bilaterally    Data Reviewed: Basic Metabolic Panel:  Recent Labs Lab 08/08/14 2048 08/09/14 0515  NA 133* 138  K 4.5 4.1  CL 95* 99  CO2 28 28  GLUCOSE 113* 96  BUN 18 14  CREATININE 0.94 0.87  CALCIUM 9.9 9.7   CBC:  Recent Labs Lab 08/08/14 2048 08/09/14 0515  WBC 5.6 6.1  NEUTROABS 3.1  --   HGB 11.8* 12.0  HCT 35.0* 36.7  MCV 92.3 93.4  PLT 274 264   Scheduled Meds: . calcium carbonate  1 tablet Oral QHS  . clindamycin (CLEOCIN) IV  300 mg Intravenous 4 times per day  . docusate sodium  100 mg Oral BID  . levothyroxine  50 mcg Oral Daily  . polycarbophil  625 mg Oral QHS  . sodium chloride  3 mL Intravenous Q12H  . timolol  1 drop Both Eyes QHS   Continuous Infusions:   Debbora PrestoMAGICK-Jalayna Josten, MD  [facility] Pager [phone]  If 7PM-7AM, please contact night-coverage [email or web address]  Password [email or web address] 08/09/2014, 8:13 AM   LOS: 1 day

## 2014-08-09 NOTE — Progress Notes (Signed)
*  Preliminary Results* Right lower extremity venous duplex completed. Right lower extremity is negative for deep vein thrombosis. There is no evidence of right Baker's cyst.  08/09/2014 10:38 AM  Gertie FeyMichelle Armandina Iman, RVT, RDCS, RDMS

## 2014-08-09 NOTE — ED Notes (Signed)
Attempted to call Tina Lane back on 4E, but pt changed to Med-Surg.

## 2014-08-09 NOTE — H&P (Signed)
Tina Lane is an 80 y.o. female.   Chief Complaint: Swelling rt lower extremity. HPI: Pt is a 80 yr old woman who is extremely hard of hearing to the point that it seriously diminishes her ability to cooperate with history portion of history and physical. According to her son who accompanies her, the patient is brought to the ED today because of increased swelling of the right lower extremity. The patient has chronic venous stasis dermatitis and was seen within the last couple of months for increased swelling of the left lower extremity. She had a doppler of that extremity at that time and was sent home. Today she appears with increased swelling of the right lower extremity.   Past Medical History  Diagnosis Date  . Glaucoma   . Thyroid disease   . GERD (gastroesophageal reflux disease)   . HOH (hard of hearing)     Past Surgical History  Procedure Laterality Date  . Abdominal surgery      Family History  Problem Relation Age of Onset  . Cancer Sister     unknown  . Cancer Sister     unknown  . Coronary artery disease Father   . Cancer Mother     died age 67   Social History:  reports that she has never smoked. She has never used smokeless tobacco. She reports that she does not drink alcohol or use illicit drugs.  Allergies:  Allergies  Allergen Reactions  . Onion Nausea And Vomiting  . Other     SPANDEX ALLERGY  . Penicillins Other (See Comments)    UNKNOWN REACTION  . Sulfa Antibiotics Other (See Comments)    UNKNOWN REACTION     (Not in a hospital admission)  Results for orders placed during the hospital encounter of 08/08/14 (from the past 48 hour(s))  CBC WITH DIFFERENTIAL     Status: Abnormal   Collection Time    08/08/14  8:48 PM      Result Value Ref Range   WBC 5.6  4.0 - 10.5 K/uL   RBC 3.79 (*) 3.87 - 5.11 MIL/uL   Hemoglobin 11.8 (*) 12.0 - 15.0 g/dL   HCT 35.0 (*) 36.0 - 46.0 %   MCV 92.3  78.0 - 100.0 fL   MCH 31.1  26.0 - 34.0 pg   MCHC 33.7   30.0 - 36.0 g/dL   RDW 14.0  11.5 - 15.5 %   Platelets 274  150 - 400 K/uL   Neutrophils Relative % 55  43 - 77 %   Neutro Abs 3.1  1.7 - 7.7 K/uL   Lymphocytes Relative 28  12 - 46 %   Lymphs Abs 1.5  0.7 - 4.0 K/uL   Monocytes Relative 12  3 - 12 %   Monocytes Absolute 0.7  0.1 - 1.0 K/uL   Eosinophils Relative 4  0 - 5 %   Eosinophils Absolute 0.2  0.0 - 0.7 K/uL   Basophils Relative 1  0 - 1 %   Basophils Absolute 0.1  0.0 - 0.1 K/uL  BASIC METABOLIC PANEL     Status: Abnormal   Collection Time    08/08/14  8:48 PM      Result Value Ref Range   Sodium 133 (*) 137 - 147 mEq/L   Potassium 4.5  3.7 - 5.3 mEq/L   Chloride 95 (*) 96 - 112 mEq/L   CO2 28  19 - 32 mEq/L   Glucose, Bld 113 (*) 70 -  99 mg/dL   BUN 18  6 - 23 mg/dL   Creatinine, Ser 0.94  0.50 - 1.10 mg/dL   Calcium 9.9  8.4 - 10.5 mg/dL   GFR calc non Af Amer 48 (*) >90 mL/min   GFR calc Af Amer 56 (*) >90 mL/min   Comment: (NOTE)     The eGFR has been calculated using the CKD EPI equation.     This calculation has not been validated in all clinical situations.     eGFR's persistently <90 mL/min signify possible Chronic Kidney     Disease.   Anion gap 10  5 - 15   No results found.  Review of Systems  Unable to perform ROS: other    Blood pressure 170/91, pulse 82, temperature 98.3 F (36.8 C), temperature source Oral, resp. rate 20, weight 54.432 kg (120 lb), SpO2 100.00%. Physical Exam  Constitutional: She appears well-developed and well-nourished. No distress.  Pt is elderly, somewhat confused and extremely hard of hearing. She is accompanied by her son, who is not well acquainted with her everyday well being. He is unable to help with history portion of H&P.  HENT:  Head: Normocephalic and atraumatic.  Mouth/Throat: No oropharyngeal exudate.  Eyes: Conjunctivae and EOM are normal. Pupils are equal, round, and reactive to light. No scleral icterus.  Neck: No JVD present. No tracheal deviation present.  No thyromegaly present.  Cardiovascular: Normal rate, regular rhythm, normal heart sounds and intact distal pulses.  Exam reveals no gallop and no friction rub.   No murmur heard. Respiratory: No respiratory distress. She has no wheezes. She has no rales. She exhibits no tenderness.  GI: She exhibits no distension and no mass. There is no tenderness. There is no rebound and no guarding.  Musculoskeletal: She exhibits no edema and no tenderness.  Lower extremities are erythematous and scaly bilaterally. They are warm to the touch bilaterally. The right lower extremity is swollen in comparison to the right. They are non-tender bilaterally.  Lymphadenopathy:    She has no cervical adenopathy.  Neurological: She is alert. She has normal reflexes. She displays normal reflexes. No cranial nerve deficit. She exhibits normal muscle tone.  Skin: Skin is warm and dry. No rash noted. She is not diaphoretic. There is erythema. No pallor.  Erythema to lower extremities bilaterally.  Psychiatric: She has a normal mood and affect. Her behavior is normal.     Assessment/Plan 1. VTE - concern for a VTE in the right lower extremity. There is no possibility of obtaining a doppler of that lower extremity tonight. Pt has already received her 59m/kg of lovenox this evening. She will undergo doppler of the rt lower extremity in the morning. 2. Chronic venous stasis dermatitis. Son confirms that the patient's legs do not appear substantially different than usual. With no elevation of white count, no fever, no other signs of infection, I will hold off on prescribing antibiotics for this woman with known venous stasis dermatitis. 3. GERD - continue home medications 5. Hypothyroidism - continue home medications.  Shirah Roseman 08/09/2014, 12:10 AM

## 2014-08-10 DIAGNOSIS — E039 Hypothyroidism, unspecified: Secondary | ICD-10-CM

## 2014-08-10 DIAGNOSIS — L03119 Cellulitis of unspecified part of limb: Principal | ICD-10-CM

## 2014-08-10 DIAGNOSIS — L02419 Cutaneous abscess of limb, unspecified: Principal | ICD-10-CM

## 2014-08-10 DIAGNOSIS — I10 Essential (primary) hypertension: Secondary | ICD-10-CM

## 2014-08-10 LAB — BASIC METABOLIC PANEL
Anion gap: 9 (ref 5–15)
BUN: 11 mg/dL (ref 6–23)
CO2: 27 mEq/L (ref 19–32)
Calcium: 9.3 mg/dL (ref 8.4–10.5)
Chloride: 99 mEq/L (ref 96–112)
Creatinine, Ser: 0.82 mg/dL (ref 0.50–1.10)
GFR calc Af Amer: 66 mL/min — ABNORMAL LOW (ref >90)
GFR calc non Af Amer: 57 mL/min — ABNORMAL LOW (ref >90)
Glucose, Bld: 93 mg/dL (ref 70–99)
Potassium: 4.2 mEq/L (ref 3.7–5.3)
Sodium: 135 mEq/L — ABNORMAL LOW (ref 137–147)

## 2014-08-10 LAB — CBC
HEMATOCRIT: 35.3 % — AB (ref 36.0–46.0)
HEMOGLOBIN: 11.7 g/dL — AB (ref 12.0–15.0)
MCH: 30.2 pg (ref 26.0–34.0)
MCHC: 33.1 g/dL (ref 30.0–36.0)
MCV: 91.2 fL (ref 78.0–100.0)
Platelets: 275 10*3/uL (ref 150–400)
RBC: 3.87 MIL/uL (ref 3.87–5.11)
RDW: 14.1 % (ref 11.5–15.5)
WBC: 5.5 10*3/uL (ref 4.0–10.5)

## 2014-08-10 LAB — PROTIME-INR
INR: 1.06 (ref 0.00–1.49)
Prothrombin Time: 13.8 seconds (ref 11.6–15.2)

## 2014-08-10 MED ORDER — CEFAZOLIN SODIUM 1-5 GM-% IV SOLN
1.0000 g | Freq: Three times a day (TID) | INTRAVENOUS | Status: DC
  Administered 2014-08-10 – 2014-08-11 (×3): 1 g via INTRAVENOUS
  Filled 2014-08-10 (×4): qty 50

## 2014-08-10 NOTE — Evaluation (Signed)
Physical Therapy Evaluation Patient Details Name: Tina Lane MRN: [id number] DOB: 05/22/1912 Today's Date: 08/10/2014   History of Present Illness   On 08/09/14, the patient is brought to the ED today because of increased swelling of the right lower extremity. Negative for DVT.  Clinical Impression  Delightful patient  Who lives alone, son checks on patient. Pt. Will benefit from PT while in acute care to return to independent level.   Follow Up Recommendations Home health PT (son reports that pt can not hear  door knock, etc so HH may not be  indicated,)    Equipment Recommendations  None recommended by PT    Recommendations for Other Services       Precautions / Restrictions Precautions Precautions: Fall      Mobility  Bed Mobility                  Transfers Overall transfer level: Needs assistance   Transfers: Sit to/from Stand Sit to Stand: Supervision            Ambulation/Gait Ambulation/Gait assistance: Supervision Ambulation Distance (Feet): 150 Feet Assistive device: Rolling walker (2 wheeled) Gait Pattern/deviations: Step-through pattern     General Gait Details: R forefoot weight bearing  Stairs            Wheelchair Mobility    Modified Rankin (Stroke Patients Only)       Balance Overall balance assessment: Needs assistance Sitting-balance support: No upper extremity supported;Feet supported Sitting balance-Leahy Scale: Good     Standing balance support: No upper extremity supported;During functional activity Standing balance-Leahy Scale: Fair                               Pertinent Vitals/Pain Pain Assessment: No/denies pain    Home Living Family/patient expects to be discharged to:: Private residence Living Arrangements: Alone Available Help at Discharge: Available PRN/intermittently;Family Type of Home: House Home Access: Stairs to enter   [employer] CorporationEntrance Stairs-Number of Steps: 3-4 Home Layout: One  level Home Equipment: Walker - 2 wheels;Cane - single point      Prior Function Level of Independence: Needs assistance   Gait / Transfers Assistance Needed: independent, son carries pt to grocery store.  Patient is independent cooking, self care           Hand Dominance        Extremity/Trunk Assessment   Upper Extremity Assessment: Overall WFL for tasks assessed           Lower Extremity Assessment: RLE deficits/detail RLE Deficits / Details: weight bears on forefoot, O/W WFL    Cervical / Trunk Assessment: Kyphotic  Communication   Communication: HOH (can communicate with writing )  Cognition Arousal/Alertness: Awake/alert Behavior During Therapy: WFL for tasks assessed/performed Overall Cognitive Status: Within Functional Limits for tasks assessed                      General Comments      Exercises        Assessment/Plan    PT Assessment Patient needs continued PT services  PT Diagnosis Difficulty walking;Abnormality of gait   PT Problem List Decreased mobility;Decreased activity tolerance  PT Treatment Interventions DME instruction;Gait training;Stair training;Functional mobility training;Therapeutic activities;Therapeutic exercise;Patient/family education   PT Goals (Current goals can be found in the Care Plan section) Acute Rehab PT Goals Patient Stated Goal: I want to go home to my cats PT Goal Formulation: With  patient/family Time For Goal Achievement: 08/24/14 Potential to Achieve Goals: Good    Frequency Min 3X/week   Barriers to discharge        Co-evaluation               End of Session Equipment Utilized During Treatment: Gait belt Activity Tolerance: Patient tolerated treatment well Patient left: in chair;with call bell/phone within reach;with family/visitor present Nurse Communication: Mobility status         Time: 1530-1551 PT Time Calculation (min): 21 min   Charges:   PT Evaluation $Initial PT  Evaluation Tier I: 1 Procedure PT Treatments $Gait Training: 8-22 mins   PT G Codes:          Rada Hay 08/10/2014, 4:22 PM

## 2014-08-10 NOTE — Progress Notes (Signed)
[facility] PROGRESS NOTE  Tina FolksOlivia M Lane [id number] DOB: 09/25/1912 DOA: 08/08/2014 PCP: No primary provider on file.  Assessment/Plan: #1 bilateral lower extremity cellulitis R > L ? Etiology. Lower extremity Dopplers negative for DVT or Baker's cyst. Will change IV clindamycin to IV Ancef. Elevate lower extremities. Follow.  #2 hypothyroidism Continue Synthroid.  #3 hypertension Stable. Follow.  #4 prophylaxis Lovenox for DVT prophylaxis.  Code Status: Full Family Communication: Updated patient and son at bedside. Disposition Plan: Home when medically stable.   Consultants:  None  Procedures:  Lower extremity Dopplers 08/09/2014  Antibiotics:  IV clindamycin 08/09/2014>>>> 08/10/2014  IV Ancef 08/10/2014  HPI/Subjective: Patient with no complaints. Patient asking when she can go home.  Objective: Filed Vitals:   08/10/14 0607  BP: 150/62  Pulse: 73  Temp: 98 F (36.7 C)  Resp: 16    Intake/Output Summary (Last 24 hours) at 08/10/14 1133 Last data filed at 08/10/14 0659  Gross per 24 hour  Intake    200 ml  Output      0 ml  Net    200 ml   Filed Weights   08/08/14 1927 08/09/14 0052  Weight: 54.432 kg (120 lb) 54.1 kg (119 lb 4.3 oz)    Exam:   General:  NAD  Cardiovascular: RRR  Respiratory: CTAB  Abdomen: Soft, nontender, nondistended, positive bowel sounds.  Musculoskeletal:  No clubbing no cyanosis. Bilateral lower extremity with erythema, warmth, swelling, tenderness to palpation R >L.  Data Reviewed: Basic Metabolic Panel:  Recent Labs Lab 08/08/14 2048 08/09/14 0515 08/10/14 0540  NA 133* 138 135*  K 4.5 4.1 4.2  CL 95* 99 99  CO2 28 28 27   GLUCOSE 113* 96 93  BUN 18 14 11   CREATININE 0.94 0.87 0.82  CALCIUM 9.9 9.7 9.3   Liver Function Tests: No results found for this basename: AST, ALT, ALKPHOS, BILITOT, PROT, ALBUMIN,  in the last 168 hours No results found for this basename: LIPASE, AMYLASE,  in the  last 168 hours No results found for this basename: AMMONIA,  in the last 168 hours CBC:  Recent Labs Lab 08/08/14 2048 08/09/14 0515 08/10/14 0540  WBC 5.6 6.1 5.5  NEUTROABS 3.1  --   --   HGB 11.8* 12.0 11.7*  HCT 35.0* 36.7 35.3*  MCV 92.3 93.4 91.2  PLT 274 264 275   Cardiac Enzymes: No results found for this basename: CKTOTAL, CKMB, CKMBINDEX, TROPONINI,  in the last 168 hours BNP (last 3 results) No results found for this basename: PROBNP,  in the last 8760 hours CBG: No results found for this basename: GLUCAP,  in the last 168 hours  No results found for this or any previous visit (from the past 240 hour(s)).   Studies: No results found.  Scheduled Meds: . calcium carbonate  1 tablet Oral QHS  .  ceFAZolin (ANCEF) IV  1 g Intravenous Q8H  . docusate sodium  100 mg Oral BID  . enoxaparin (LOVENOX) injection  30 mg Subcutaneous Q24H  . levothyroxine  50 mcg Oral Daily  . polycarbophil  625 mg Oral QHS  . sodium chloride  3 mL Intravenous Q12H  . timolol  1 drop Both Eyes QHS   Continuous Infusions:   Principal Problem:   Cellulitis Active Problems:   Hypothyroidism   GERD (gastroesophageal reflux disease)   Leg swelling   Chronic venous stasis dermatitis of both lower extremities   VTE (venous thromboembolism)    Time spent: 35  mins    [facility] MD [facility] Pager [phone]. If 7PM-7AM, please contact night-coverage at [email or web address], password [facility] 08/10/2014, 11:33 AM  LOS: 2 days

## 2014-08-11 LAB — CBC
HEMATOCRIT: 35.4 % — AB (ref 36.0–46.0)
Hemoglobin: 11.7 g/dL — ABNORMAL LOW (ref 12.0–15.0)
MCH: 30.2 pg (ref 26.0–34.0)
MCHC: 33.1 g/dL (ref 30.0–36.0)
MCV: 91.2 fL (ref 78.0–100.0)
PLATELETS: 299 10*3/uL (ref 150–400)
RBC: 3.88 MIL/uL (ref 3.87–5.11)
RDW: 14.3 % (ref 11.5–15.5)
WBC: 5.7 10*3/uL (ref 4.0–10.5)

## 2014-08-11 LAB — BASIC METABOLIC PANEL
Anion gap: 10 (ref 5–15)
BUN: 11 mg/dL (ref 6–23)
CALCIUM: 9.3 mg/dL (ref 8.4–10.5)
CO2: 26 meq/L (ref 19–32)
Chloride: 99 mEq/L (ref 96–112)
Creatinine, Ser: 0.83 mg/dL (ref 0.50–1.10)
GFR calc Af Amer: 65 mL/min — ABNORMAL LOW (ref >90)
GFR calc non Af Amer: 56 mL/min — ABNORMAL LOW (ref >90)
Glucose, Bld: 95 mg/dL (ref 70–99)
POTASSIUM: 4.1 meq/L (ref 3.7–5.3)
SODIUM: 135 meq/L — AB (ref 137–147)

## 2014-08-11 LAB — PROTIME-INR
INR: 1.01 (ref 0.00–1.49)
Prothrombin Time: 13.3 seconds (ref 11.6–15.2)

## 2014-08-11 MED ORDER — VANCOMYCIN HCL IN DEXTROSE 750-5 MG/150ML-% IV SOLN
750.0000 mg | Freq: Once | INTRAVENOUS | Status: AC
  Administered 2014-08-11: 750 mg via INTRAVENOUS
  Filled 2014-08-11: qty 150

## 2014-08-11 MED ORDER — VANCOMYCIN HCL 500 MG IV SOLR: 500.0000 mg | INTRAVENOUS | Status: DC

## 2014-08-11 MED ORDER — DOXYCYCLINE HYCLATE 100 MG PO TABS: 100.0000 mg | ORAL_TABLET | Freq: Two times a day (BID) | ORAL | Status: AC

## 2014-08-11 NOTE — Progress Notes (Signed)
ANTIBIOTIC CONSULT NOTE - INITIAL  Pharmacy Consult for Vancomycin  Indication: Cellulitis  Allergies  Allergen Reactions  . Onion Nausea And Vomiting  . Other     SPANDEX ALLERGY  . Penicillins Other (See Comments)    UNKNOWN REACTION  . Sulfa Antibiotics Other (See Comments)    UNKNOWN REACTION    Patient Measurements: Height: 5\' 4"  (162.6 cm) Weight: 119 lb 4.3 oz (54.1 kg) IBW/kg (Calculated) : 54.7  Vital Signs: Temp: 98.4 F (36.9 C) (08/13 0526) Temp src: Oral (08/13 0526) BP: 113/49 mmHg (08/13 0526) Pulse Rate: 81 (08/13 0526) Intake/Output from previous day: 08/12 0701 - 08/13 0700 In: 150 [IV Piggyback:150] Out: -  Intake/Output from this shift:    Labs:  Recent Labs  08/09/14 0515 08/10/14 0540 08/11/14 0500  WBC 6.1 5.5 5.7  HGB 12.0 11.7* 11.7*  PLT 264 275 299  CREATININE 0.87 0.82 0.83   Estimated Creatinine Clearance: 30.8 ml/min (by C-G formula based on Cr of 0.83). No results found for this basename: VANCOTROUGH, VANCOPEAK, VANCORANDOM, GENTTROUGH, GENTPEAK, GENTRANDOM, TOBRATROUGH, TOBRAPEAK, TOBRARND, AMIKACINPEAK, AMIKACINTROU, AMIKACIN,  in the last 72 hours   Microbiology: No results found for this or any previous visit (from the past 720 hour(s)).  Medical History: Past Medical History  Diagnosis Date  . Glaucoma   . Thyroid disease   . GERD (gastroesophageal reflux disease)   . HOH (hard of hearing)     Medications:  Scheduled:  . calcium carbonate  1 tablet Oral QHS  .  ceFAZolin (ANCEF) IV  1 g Intravenous Q8H  . docusate sodium  100 mg Oral BID  . enoxaparin (LOVENOX) injection  30 mg Subcutaneous Q24H  . levothyroxine  50 mcg Oral Daily  . polycarbophil  625 mg Oral QHS  . sodium chloride  3 mL Intravenous Q12H  . timolol  1 drop Both Eyes QHS   Infusions:   PRN: acetaminophen, acetaminophen, aspirin EC, hydrALAZINE, ondansetron (ZOFRAN) IV, ondansetron  Assessment: 80 year old female with hx chronic venous  stasis dermatitis, presents 8/10 with swelling of BLE, R > L. Dopplers negative for DVT. Initially placed on clindamycin, then changed to ancef due to concerns for C.diff. Now changing to vancomycin.  8/10 >> Clinda >> 8/12 8/12 >> Ancef >> 8/13 8/13 >> Vanc >>  Tmax: AFebrile WBC: wnl Renal: SCr 0.83, CrCl 30 ml/min CG  Goal of Therapy:  Vancomycin trough level 10-15 mcg/ml  Plan:   Vancomycin 750mg  IV x 1, then 500mg  IV q24h Check trough at steady state Follow up renal function & cultures   Loralee PacasErin Rogerio Boutelle, PharmD, BCPS Pager: [phone] 08/11/2014,8:34 AM

## 2014-08-11 NOTE — Discharge Summary (Signed)
Physician Discharge Summary  Tina Lane:[id number] DOB: 10-21-1912 DOA: 08/08/2014  PCP: [name], MD  Admit date: 08/08/2014 Discharge date: 08/11/2014  Time spent: 60 minutes  Recommendations for Outpatient Follow-up:  1. Followup with Tina,[name], MD a one-week. On followup patient's lower extremity cellulitis will need to be reassessed at that time.  Discharge Diagnoses:  Principal Problem:   Cellulitis Active Problems:   Hypothyroidism   GERD (gastroesophageal reflux disease)   Leg swelling   Chronic venous stasis dermatitis of both lower extremities   VTE (venous thromboembolism)   Discharge Condition: Stable and improved  Diet recommendation: Regular  Filed Weights   08/08/14 1927 08/09/14 0052  Weight: 54.432 kg (120 lb) 54.1 kg (119 lb 4.3 oz)    History of present illness:  Pt is a 80 yr old woman who is extremely hard of hearing to the point that it seriously diminishes her ability to cooperate with history portion of history and physical. According to her son who accompanied her, the patient was brought to the ED, because of increased swelling of the right lower extremity. The patient has chronic venous stasis dermatitis and was seen within the last couple of months for increased swelling of the left lower extremity. She had a doppler of that extremity at that time and was sent home. On day of admission patient appeared to have increased swelling of the right lower extremity, per admitting M.D.   Hospital Course:  #1 bilateral lower extremity cellulitis R > L  Patient was admitted with concerns for bilateral lower extremity cellulitis right greater than left. Due to swelling the right lower extremity lower extremity Dopplers were obtained which were negative for DVT. Patient was initially placed on IV clindamycin which was subsequently changed to IV Ancef. IV Ancef was subsequently changed to IV vancomycin which patient tolerated. Patient improved  clinically. Patient remained afebrile. Patient will be discharged home on doxycycline 100 mg twice daily for 6 more days to complete a one-week course of antibiotic therapy. Patient will followup with PCP as outpatient. #2 hypothyroidism  Continued on home regimen of Synthroid.  #3 hypertension  Remained stable throughout the hospitalization.      Procedures: Lower extremity Dopplers 08/09/2014   Consultations:  None  Discharge Exam: Filed Vitals:   08/11/14 0526  BP: 113/49  Pulse: 81  Temp: 98.4 F (36.9 C)  Resp: 18    General: NAD Cardiovascular: RRR Respiratory: CTAB Extremities:Decreasing erythema, Non tender, less warmth, No edema. Chronic venous stasis changes noted on bilateral lower extremities.  Discharge Instructions You were cared for by a hospitalist during your hospital stay. If you have any questions about your discharge medications or the care you received while you were in the hospital after you are discharged, you can call the unit and asked to speak with the hospitalist on call if the hospitalist that took care of you is not available. Once you are discharged, your primary care physician will handle any further medical issues. Please note that NO REFILLS for any discharge medications will be authorized once you are discharged, as it is imperative that you return to your primary care physician (or establish a relationship with a primary care physician if you do not have one) for your aftercare needs so that they can reassess your need for medications and monitor your lab values.      Discharge Instructions   Diet general    Complete by:  As directed      Discharge instructions  Complete by:  As directed   Follow up with Tina,[name], MD in 1 week.     Increase activity slowly    Complete by:  As directed             Medication List         aspirin EC 81 MG tablet  Take 81 mg by mouth as needed (chest pain).     CALCIUM 600 + D PO  Take 1  tablet by mouth daily.     calcium carbonate 750 MG chewable tablet  Commonly known as:  TUMS EX  Chew 1 tablet by mouth at bedtime.     doxycycline 100 MG tablet  Commonly known as:  VIBRA-TABS  Take 1 tablet (100 mg total) by mouth 2 (two) times daily. Take for 6 days then stop.     levothyroxine 50 MCG tablet  Commonly known as:  SYNTHROID, LEVOTHROID  Take 1 tablet (50 mcg total) by mouth daily.     polycarbophil 625 MG tablet  Commonly known as:  FIBERCON  Take 625 mg by mouth at bedtime.     timolol 0.5 % ophthalmic solution  Commonly known as:  BETIMOL  Place 1 drop into both eyes at bedtime.     Vitamin D3 1000 UNITS Caps  Take 1 capsule by mouth daily.       Allergies  Allergen Reactions  . Onion Nausea And Vomiting  . Other     SPANDEX ALLERGY  . Penicillins Other (See Comments)    UNKNOWN REACTION  . Sulfa Antibiotics Other (See Comments)    UNKNOWN REACTION   Follow-up Information   Follow up with [facility]. (home health physical and occupational therapy, aide, and nurse)    Contact information:   [address] [place] [address] [phone]       Follow up with Tina,[name], MD. Schedule an appointment as soon as possible for a visit in 1 week.   Specialty:  Family Medicine   Contact information:   [address] [place] [address] [phone]        The results of significant diagnostics from this hospitalization (including imaging, microbiology, ancillary and laboratory) are listed below for reference.    Significant Diagnostic Studies: Dg Chest 2 View  07/16/2014   CLINICAL DATA:  Upper back pain.  No injury.  EXAM: CHEST  2 VIEW  COMPARISON:  07/24/2012.  FINDINGS: Bony thorax is diffusely demineralized. No thoracic fracture is evident.  Cardiac silhouette is mildly enlarged. No mediastinal or hilar masses.  Lungs are hyperexpanded. There are stable areas of lung scarring. No lung consolidation  or edema. No pleural effusion or pneumothorax.  IMPRESSION: No acute cardiopulmonary disease.   Electronically Signed   By: Amie Portland M.D.   On: 07/16/2014 15:42   Dg Thoracic Spine 2 View  07/16/2014   CLINICAL DATA:  Back pain.  EXAM: THORACIC SPINE - 2 VIEW  COMPARISON:  Lateral chest radiograph, 07/24/2012  FINDINGS: Bones are diffusely demineralized. There is no fracture or spondylolisthesis. There are mild disc degenerative changes throughout the mid thoracic spine as well as an accentuated thoracic kyphosis. There is a mild curvature of the thoracic spine, convex to the left in the upper thoracic spine and to the right at the thoracolumbar junction.  There is mild loss of vertebral body height at L1 consistent with a mild compression fracture. This is old.  Soft tissues are unremarkable.  IMPRESSION: No  acute fracture or acute finding.   Electronically Signed   By: Amie Portlandavid  Ormond M.D.   On: 07/16/2014 15:43    Microbiology: No results found for this or any previous visit (from the past 240 hour(s)).   Labs: Basic Metabolic Panel:  Recent Labs Lab 08/08/14 2048 08/09/14 0515 08/10/14 0540 08/11/14 0500  NA 133* 138 135* 135*  K 4.5 4.1 4.2 4.1  CL 95* 99 99 99  CO2 28 28 27 26   GLUCOSE 113* 96 93 95  BUN 18 14 11 11   CREATININE 0.94 0.87 0.82 0.83  CALCIUM 9.9 9.7 9.3 9.3   Liver Function Tests: No results found for this basename: AST, ALT, ALKPHOS, BILITOT, PROT, ALBUMIN,  in the last 168 hours No results found for this basename: LIPASE, AMYLASE,  in the last 168 hours No results found for this basename: AMMONIA,  in the last 168 hours CBC:  Recent Labs Lab 08/08/14 2048 08/09/14 0515 08/10/14 0540 08/11/14 0500  WBC 5.6 6.1 5.5 5.7  NEUTROABS 3.1  --   --   --   HGB 11.8* 12.0 11.7* 11.7*  HCT 35.0* 36.7 35.3* 35.4*  MCV 92.3 93.4 91.2 91.2  PLT 274 264 275 299   Cardiac Enzymes: No results found for this basename: CKTOTAL, CKMB, CKMBINDEX, TROPONINI,  in the  last 168 hours BNP: BNP (last 3 results) No results found for this basename: PROBNP,  in the last 8760 hours CBG: No results found for this basename: GLUCAP,  in the last 168 hours     Signed:  [facility] ColinasHOMPSON,DANIEL MD [facility] 08/11/2014, 2:40 PM

## 2014-08-11 NOTE — Evaluation (Signed)
Occupational Therapy Evaluation Patient Details Name: Tina Lane MRN: [id number] DOB: 1912-12-17 Today's Date: 08/11/2014    History of Present Illness  On 08/09/14, the patient is brought to the ED today because of increased swelling of the right lower extremity. Negative for DVT.   Clinical Impression   Pt is a very independent, pleasant woman who lives alone, but has a supportive son and daughter in law.  She demonstrates impaired balance interfering with safety and ability to perform ADL independently.  Will follow acutely.  Recommending HHOT to address safety at home with IADL.   Follow Up Recommendations  No OT follow up    Equipment Recommendations  None recommended by OT    Recommendations for Other Services       Precautions / Restrictions Precautions Precautions: Fall Restrictions Weight Bearing Restrictions: No      Mobility Bed Mobility Overal bed mobility: Modified Independent                Transfers Overall transfer level: Needs assistance Equipment used: Rolling walker (2 wheeled) Transfers: Sit to/from Stand Sit to Stand: Supervision              Balance                                            ADL Overall ADL's : Needs assistance/impaired Eating/Feeding: Independent;Sitting   Grooming: Min guard;Wash/dry hands;Standing   Upper Body Bathing: Set up;Sitting   Lower Body Bathing: Min guard;Sit to/from stand   Upper Body Dressing : Set up;Sitting   Lower Body Dressing: Min guard;Sit to/from stand   Toilet Transfer: Minimal assistance;Ambulation;Comfort height toilet;Grab bars   Toileting- Clothing Manipulation and Hygiene: Minimal assistance;Sit to/from stand       Functional mobility during ADLs: Minimal assistance;Rolling walker General ADL Comments: Pt with LOB after toileting when manipulating panties and turning to flush. Scissoring with ambulation.     Vision                      Perception     Praxis      Pertinent Vitals/Pain Pain Assessment: No/denies pain     Hand Dominance Right   Extremity/Trunk Assessment Upper Extremity Assessment Upper Extremity Assessment: Overall WFL for tasks assessed   Lower Extremity Assessment Lower Extremity Assessment: Defer to PT evaluation RLE Deficits / Details: weight bears on forefoot, O/W WFL   Cervical / Trunk Assessment Cervical / Trunk Assessment: Kyphotic   Communication Communication Communication: HOH   Cognition Arousal/Alertness: Awake/alert Behavior During Therapy: WFL for tasks assessed/performed Overall Cognitive Status: Within Functional Limits for tasks assessed                     General Comments       Exercises       Shoulder Instructions      Home Living Family/patient expects to be discharged to:: Private residence Living Arrangements: Alone Available Help at Discharge: Available PRN/intermittently;Family Type of Home: House Home Access: Level entry (stays in walk out basement)     Home Layout: Two level (rarely goes upstairs)     Bathroom Shower/Tub:  (sponge bathes)   Bathroom Toilet: Standard     Home Equipment: Walker - 2 wheels;Cane - single point          Prior Functioning/Environment Level of Independence: Needs assistance  Gait / Transfers Assistance Needed: independent, son carries pt to grocery store.     Comments: Pt cares for her two cats.    OT Diagnosis: Generalized weakness   OT Problem List: Impaired balance (sitting and/or standing);Decreased safety awareness   OT Treatment/Interventions: Self-care/ADL training;DME and/or AE instruction;Patient/family education;Balance training;Therapeutic activities    OT Goals(Current goals can be found in the care plan section) Acute Rehab OT Goals Patient Stated Goal: I want to go home to my cats OT Goal Formulation: With patient Time For Goal Achievement: 08/18/14 Potential to Achieve Goals:  Good ADL Goals Pt Will Perform Grooming: with supervision;standing Pt Will Perform Lower Body Bathing: with supervision;sit to/from stand Pt Will Perform Lower Body Dressing: with supervision;sit to/from stand Pt Will Transfer to Toilet: with supervision;regular height toilet;ambulating Pt Will Perform Toileting - Clothing Manipulation and hygiene: with supervision;sit to/from stand Additional ADL Goal #1: Pt will gather items necessary for ADL using her RW with supervision.  OT Frequency: Min 2X/week   Barriers to D/C:            Co-evaluation              End of Session    Activity Tolerance: Patient tolerated treatment well Patient left: in chair;with call bell/phone within reach;with family/visitor present   Time: 0921-0955 OT Time Calculation (min): 34 min Charges:  OT General Charges $OT Visit: 1 Procedure OT Evaluation $Initial OT Evaluation Tier I: 1 Procedure OT Treatments $Self Care/Home Management : 8-22 mins G-Codes:    Evern BioMayberry, Hennie Gosa [place] 08/11/2014, 11:08 AM [phone]

## 2014-08-11 NOTE — Care Management Note (Addendum)
  Page 2 of 2   08/11/2014     11:13:05 AM CARE MANAGEMENT NOTE 08/11/2014  Patient:  ARETHA, LEVI   Account Number:  [id number]  Date Initiated:  08/10/2014  Documentation initiated by:  [facility]  Subjective/Objective Assessment:   adm: Swelling rt lower extremity     Action/Plan:   SNF vs HH   Anticipated DC Date:  08/12/2014   Anticipated DC Plan:        DC Planning Services  CM consult      [facility] Choice  HOME HEALTH   Choice offered to / List presented to:  C-1 Patient        [place] arranged  HH-1 RN  [place]      [place].   Status of service:  Completed, signed off Medicare Important Message given?   (If response is "NO", the following Medicare IM given date fields will be blank) Date Medicare IM given:   Medicare IM given by:   Date Additional Medicare IM given:   Additional Medicare IM given by:    Discharge Disposition:  [place]  Per UR Regulation:    If discussed at Long Length of Stay Meetings, dates discussed:    Comments:  8/13 15 11:00 CM met with pt and pt's son Ovid Curd, [phone], to offer choice of home health agency.  Pt chooses [facility] to render HHPT/OT/RN/Aide.  No DME recc or needed.  CM called referral to [facility] rep, Cyril Mourning and gave Cyril Mourning the correct address and contact number.  No other CM needs were communicated.  Mariane Masters, BSN, [place] [phone].    08/10/14 13:45 CM met with pt in room but pt working with PT/OT.  CM spoke with MD as it does not appear pt has DVT and inquired if pt still needs lovenox.  MD states NO. Will follow tomorrow.  Mariane Masters, BSN, CM [phone].

## 2014-08-11 NOTE — Progress Notes (Signed)
Physical Therapy Treatment Patient Details Name: Tina Lane MRN: [id number] DOB: 08-06-1912 Today's Date: 08/11/2014    History of Present Illness      PT Comments    Pt not as chipper with walking today, states her R leg is more sore. Pt did have more difficulty w/ sit to stand. Son aware, pt has been up most of the day.  Follow Up Recommendations  Home health PT     Equipment Recommendations  None recommended by PT    Recommendations for Other Services       Precautions / Restrictions Precautions Precautions: Fall    Mobility  Bed Mobility                  Transfers Overall transfer level: Needs assistance   Transfers: Sit to/from Stand Sit to Stand: Min assist         General transfer comment: pt had more difficulty with sit to stand today, pt reports R leg is more sore  Ambulation/Gait Ambulation/Gait assistance: Supervision Ambulation Distance (Feet): 150 Feet Assistive device: Rolling walker (2 wheeled) Gait Pattern/deviations: Step-through pattern;Antalgic     General Gait Details: R forefoot weight bearing   Stairs            Wheelchair Mobility    Modified Rankin (Stroke Patients Only)       Balance                                    Cognition Arousal/Alertness: Awake/alert Behavior During Therapy: WFL for tasks assessed/performed                        Exercises      General Comments        Pertinent Vitals/Pain      Home Living                      Prior Function            PT Goals (current goals can now be found in the care plan section) Progress towards PT goals: Progressing toward goals    Frequency  Min 3X/week    PT Plan Current plan remains appropriate    Co-evaluation             End of Session   Activity Tolerance: Patient tolerated treatment well Patient left: in bed;with call bell/phone within reach;with family/visitor present     Time:  1301-1316 PT Time Calculation (min): 15 min  Charges:  $Gait Training: 8-22 mins                    G Codes:      Tina Lane, Tina Lane 08/11/2014, 3:01 PM

## 2014-08-11 NOTE — Progress Notes (Signed)
DC instructions reviewed by Blake DivineK. Ryan. IV DC. No changes noted since am assessment. HH in place. Patient to DC to home with son.

## 2014-09-27 ENCOUNTER — Encounter (HOSPITAL_COMMUNITY): Payer: Self-pay | Admitting: Emergency Medicine

## 2014-09-27 ENCOUNTER — Emergency Department (HOSPITAL_COMMUNITY): Payer: 59

## 2014-09-27 ENCOUNTER — Observation Stay (HOSPITAL_COMMUNITY)
Admission: EM | Admit: 2014-09-27 | Discharge: 2014-09-29 | Disposition: A | Discharge status: Home / Self Care | Payer: 59 | Attending: Emergency Medicine | Admitting: Emergency Medicine

## 2014-09-27 DIAGNOSIS — Z7982 Long term (current) use of aspirin: Secondary | ICD-10-CM | POA: Diagnosis not present

## 2014-09-27 DIAGNOSIS — Z880 Allergy status to penicillin: Secondary | ICD-10-CM | POA: Diagnosis not present

## 2014-09-27 DIAGNOSIS — S5012XA Contusion of left forearm, initial encounter: Secondary | ICD-10-CM | POA: Insufficient documentation

## 2014-09-27 DIAGNOSIS — IMO0001 Reserved for inherently not codable concepts without codable children: Secondary | ICD-10-CM | POA: Diagnosis present

## 2014-09-27 DIAGNOSIS — Z79899 Other long term (current) drug therapy: Secondary | ICD-10-CM | POA: Diagnosis not present

## 2014-09-27 DIAGNOSIS — S79919A Unspecified injury of unspecified hip, initial encounter: Secondary | ICD-10-CM | POA: Diagnosis present

## 2014-09-27 DIAGNOSIS — W010XXA Fall on same level from slipping, tripping and stumbling without subsequent striking against object, initial encounter: Secondary | ICD-10-CM | POA: Diagnosis not present

## 2014-09-27 DIAGNOSIS — H9190 Unspecified hearing loss, unspecified ear: Secondary | ICD-10-CM | POA: Diagnosis not present

## 2014-09-27 DIAGNOSIS — L03116 Cellulitis of left lower limb: Secondary | ICD-10-CM

## 2014-09-27 DIAGNOSIS — I8290 Acute embolism and thrombosis of unspecified vein: Secondary | ICD-10-CM

## 2014-09-27 DIAGNOSIS — S329XXA Fracture of unspecified parts of lumbosacral spine and pelvis, initial encounter for closed fracture: Secondary | ICD-10-CM | POA: Diagnosis not present

## 2014-09-27 DIAGNOSIS — Z91040 Latex allergy status: Secondary | ICD-10-CM | POA: Insufficient documentation

## 2014-09-27 DIAGNOSIS — S32511A Fracture of superior rim of right pubis, initial encounter for closed fracture: Principal | ICD-10-CM | POA: Insufficient documentation

## 2014-09-27 DIAGNOSIS — IMO0002 Reserved for concepts with insufficient information to code with codable children: Secondary | ICD-10-CM | POA: Diagnosis not present

## 2014-09-27 DIAGNOSIS — S79812A Other specified injuries of left hip, initial encounter: Secondary | ICD-10-CM | POA: Insufficient documentation

## 2014-09-27 DIAGNOSIS — K219 Gastro-esophageal reflux disease without esophagitis: Secondary | ICD-10-CM | POA: Diagnosis not present

## 2014-09-27 DIAGNOSIS — I672 Cerebral atherosclerosis: Secondary | ICD-10-CM | POA: Diagnosis not present

## 2014-09-27 DIAGNOSIS — Y92009 Unspecified place in unspecified non-institutional (private) residence as the place of occurrence of the external cause: Secondary | ICD-10-CM | POA: Diagnosis not present

## 2014-09-27 DIAGNOSIS — E079 Disorder of thyroid, unspecified: Secondary | ICD-10-CM | POA: Diagnosis not present

## 2014-09-27 DIAGNOSIS — I872 Venous insufficiency (chronic) (peripheral): Secondary | ICD-10-CM

## 2014-09-27 DIAGNOSIS — S5010XA Contusion of unspecified forearm, initial encounter: Secondary | ICD-10-CM | POA: Diagnosis not present

## 2014-09-27 DIAGNOSIS — M7989 Other specified soft tissue disorders: Secondary | ICD-10-CM

## 2014-09-27 DIAGNOSIS — W19XXXA Unspecified fall, initial encounter: Secondary | ICD-10-CM | POA: Insufficient documentation

## 2014-09-27 DIAGNOSIS — E039 Hypothyroidism, unspecified: Secondary | ICD-10-CM

## 2014-09-27 DIAGNOSIS — Y939 Activity, unspecified: Secondary | ICD-10-CM | POA: Diagnosis not present

## 2014-09-27 DIAGNOSIS — H409 Unspecified glaucoma: Secondary | ICD-10-CM

## 2014-09-27 DIAGNOSIS — S60812A Abrasion of left wrist, initial encounter: Secondary | ICD-10-CM | POA: Insufficient documentation

## 2014-09-27 HISTORY — DX: Fracture of unspecified parts of lumbosacral spine and pelvis, initial encounter for closed fracture: S32.9XXA

## 2014-09-27 LAB — BASIC METABOLIC PANEL
Anion gap: 13 (ref 5–15)
BUN: 13 mg/dL (ref 6–23)
CALCIUM: 9.9 mg/dL (ref 8.4–10.5)
CHLORIDE: 96 meq/L (ref 96–112)
CO2: 26 meq/L (ref 19–32)
Creatinine, Ser: 0.84 mg/dL (ref 0.50–1.10)
GFR calc Af Amer: 64 mL/min — ABNORMAL LOW (ref >90)
GFR calc non Af Amer: 55 mL/min — ABNORMAL LOW (ref >90)
Glucose, Bld: 136 mg/dL — ABNORMAL HIGH (ref 70–99)
Potassium: 4 mEq/L (ref 3.7–5.3)
Sodium: 135 mEq/L — ABNORMAL LOW (ref 137–147)

## 2014-09-27 LAB — CK: Total CK: 146 U/L (ref 7–177)

## 2014-09-27 LAB — CBC WITH DIFFERENTIAL/PLATELET
BASOS ABS: 0 10*3/uL (ref 0.0–0.1)
Basophils Relative: 0 % (ref 0–1)
EOS ABS: 0 10*3/uL (ref 0.0–0.7)
EOS PCT: 0 % (ref 0–5)
HEMATOCRIT: 38.4 % (ref 36.0–46.0)
HEMOGLOBIN: 13 g/dL (ref 12.0–15.0)
Lymphocytes Relative: 8 % — ABNORMAL LOW (ref 12–46)
Lymphs Abs: 0.8 10*3/uL (ref 0.7–4.0)
MCH: 31.3 pg (ref 26.0–34.0)
MCHC: 33.9 g/dL (ref 30.0–36.0)
MCV: 92.3 fL (ref 78.0–100.0)
MONO ABS: 0.6 10*3/uL (ref 0.1–1.0)
MONOS PCT: 6 % (ref 3–12)
Neutro Abs: 8.4 10*3/uL — ABNORMAL HIGH (ref 1.7–7.7)
Neutrophils Relative %: 86 % — ABNORMAL HIGH (ref 43–77)
Platelets: 246 10*3/uL (ref 150–400)
RBC: 4.16 MIL/uL (ref 3.87–5.11)
RDW: 13.6 % (ref 11.5–15.5)
WBC: 9.8 10*3/uL (ref 4.0–10.5)

## 2014-09-27 NOTE — ED Notes (Signed)
Pt states she fell about 1145 today. States she had to lay there until her son came home from work. Pt indicates pain in bilateral groin area

## 2014-09-27 NOTE — Discharge Instructions (Signed)
Workup for the fall without any  significant injuries. CT head and neck negative. X-rays of both hips is negative. Patient also clinically with good range of motion of her hips. Clinically not concerned about any occult type fracture. Discharge home and followup with your doctor as needed.

## 2014-09-27 NOTE — ED Provider Notes (Addendum)
CSN: [id number]     Arrival date & time 09/27/14  1706 History   This chart was scribed for Vanetta Mulders, MD, by Yevette Edwards, ED Scribe. This patient was seen in room APA06/APA06 and the patient's care was started at 5:41 PM.   First MD Initiated Contact with Patient 09/27/14 1711     Chief Complaint  Patient presents with  . Fall   Patient is a 80 y.o. female presenting with fall. The history is provided by the patient and a relative. No language interpreter was used.  Fall This is a new problem. The current episode started 6 to 12 hours ago. The problem occurs rarely. The problem has not changed since onset.Pertinent negatives include no abdominal pain and no shortness of breath. The symptoms are aggravated by walking and standing. Nothing relieves the symptoms. She has tried nothing for the symptoms. The treatment provided no relief.    Level Five Caveat: poor historian   HPI Comments: SARAHANN HORRELL is a 80 y.o. female who presents to the Emergency Department complaining of a fall at home which occurred six hours ago when she tripped. She required assistance to stand, and she remained sitting on the floor for four hours until her son returned home from work. The pt complains of bilateral hip pain which is increased with movement. She denies pain elsewhere.   Past Medical History  Diagnosis Date  . Glaucoma   . Thyroid disease   . GERD (gastroesophageal reflux disease)   . HOH (hard of hearing)    Past Surgical History  Procedure Laterality Date  . Abdominal surgery     Family History  Problem Relation Age of Onset  . Cancer Sister     unknown  . Cancer Sister     unknown  . Coronary artery disease Father   . Cancer Mother     died age 87   History  Substance Use Topics  . Smoking status: Never Smoker   . Smokeless tobacco: Never Used  . Alcohol Use: No   No OB history provided.  Review of Systems  Unable to perform ROS: Other  Respiratory: Negative for  shortness of breath.   Gastrointestinal: Negative for abdominal pain.    Allergies  Latex; Onion; Other; Penicillins; and Sulfa antibiotics  Home Medications   Prior to Admission medications   Medication Sig Start Date End Date Taking? Authorizing Provider  calcium carbonate (TUMS EX) 750 MG chewable tablet Chew 1 tablet by mouth at bedtime.    Yes Historical Provider, MD  Calcium Carbonate-Vitamin D (CALCIUM 600 + D PO) Take 1 tablet by mouth every morning.    Yes Historical Provider, MD  Cholecalciferol (VITAMIN D3) 1000 UNITS CAPS Take 1 capsule by mouth every morning.    Yes Historical Provider, MD  levothyroxine (SYNTHROID, LEVOTHROID) 75 MCG tablet Take 75 mcg by mouth daily before breakfast.   Yes Historical Provider, MD  polycarbophil (FIBERCON) 625 MG tablet Take 625 mg by mouth at bedtime.   Yes Historical Provider, MD  timolol (BETIMOL) 0.5 % ophthalmic solution Place 1 drop into both eyes at bedtime.     Yes Historical Provider, MD  aspirin EC 81 MG tablet Take 81 mg by mouth as needed (chest pain).    Historical Provider, MD   Triage Vitals: BP 181/66  Pulse 102  Temp(Src) 98 F (36.7 C) (Oral)  Resp 22  SpO2 100%  Physical Exam  Constitutional: No distress.  HENT:  Head: Normocephalic and atraumatic.  Eyes: EOM are normal.  Neck: Normal range of motion.  Moves head well.   Cardiovascular: Normal rate, regular rhythm and normal heart sounds.   Pulmonary/Chest: Effort normal. She has no wheezes. She has no rales.  Bilateral rhonchi.   Abdominal: Soft. There is no tenderness.  Musculoskeletal:  Length of legs symmetrical.  Pt wriggling legs bilaterally.   Neurological: She is alert. No cranial nerve deficit.  Skin: Skin is warm. She is not diaphoretic.  No bruising to abdomen.  Abrasion to left wrist.  Bruise to left forearm, measuring 10 cm by 4 cm. Lower half of both legs anteriorally have chronic redness.   Psychiatric: She has a normal mood and affect. Her  behavior is normal.    ED Course  Procedures (including critical care time)  DIAGNOSTIC STUDIES: Oxygen Saturation is 100% on room air, normal by my interpretation.    COORDINATION OF CARE:  5:49 PM- Discussed treatment plan with patient, and the patient agreed to the plan. The plan includes imaging and lab work.   Results for orders placed during the hospital encounter of 09/27/14  BASIC METABOLIC PANEL      Result Value Ref Range   Sodium 135 (*) 137 - 147 mEq/L   Potassium 4.0  3.7 - 5.3 mEq/L   Chloride 96  96 - 112 mEq/L   CO2 26  19 - 32 mEq/L   Glucose, Bld 136 (*) 70 - 99 mg/dL   BUN 13  6 - 23 mg/dL   Creatinine, Ser 1.61  0.50 - 1.10 mg/dL   Calcium 9.9  8.4 - 09.6 mg/dL   GFR calc non Af Amer 55 (*) >90 mL/min   GFR calc Af Amer 64 (*) >90 mL/min   Anion gap 13  5 - 15  CBC WITH DIFFERENTIAL      Result Value Ref Range   WBC 9.8  4.0 - 10.5 K/uL   RBC 4.16  3.87 - 5.11 MIL/uL   Hemoglobin 13.0  12.0 - 15.0 g/dL   HCT 04.5  40.9 - 81.1 %   MCV 92.3  78.0 - 100.0 fL   MCH 31.3  26.0 - 34.0 pg   MCHC 33.9  30.0 - 36.0 g/dL   RDW 91.4  78.2 - 95.6 %   Platelets 246  150 - 400 K/uL   Neutrophils Relative % 86 (*) 43 - 77 %   Neutro Abs 8.4 (*) 1.7 - 7.7 K/uL   Lymphocytes Relative 8 (*) 12 - 46 %   Lymphs Abs 0.8  0.7 - 4.0 K/uL   Monocytes Relative 6  3 - 12 %   Monocytes Absolute 0.6  0.1 - 1.0 K/uL   Eosinophils Relative 0  0 - 5 %   Eosinophils Absolute 0.0  0.0 - 0.7 K/uL   Basophils Relative 0  0 - 1 %   Basophils Absolute 0.0  0.0 - 0.1 K/uL  CK      Result Value Ref Range   Total CK 146  7 - 177 U/L    Imaging Review Dg Chest 1 View  09/27/2014   CLINICAL DATA:  Fall.  EXAM: CHEST - 1 VIEW  COMPARISON:  July 16, 2014.  FINDINGS: The heart size and mediastinal contours are within normal limits. Both lungs are clear. No pneumothorax or pleural effusion is noted. The visualized skeletal structures are unremarkable.  IMPRESSION: No acute  cardiopulmonary abnormality seen.   Electronically Signed   By: Marry Guan.D.  On: 09/27/2014 19:35   Dg Hip Bilateral W/pelvis  09/27/2014   CLINICAL DATA:  Right hip pain after falling.  EXAM: BILATERAL HIP WITH PELVIS - 4+ VIEW  COMPARISON:  Pelvic CT 08/07/2011.  FINDINGS: The bones are demineralized. There is no evidence of acute fracture or dislocation. There are stable mild degenerative changes of both hips and sacroiliac joints. There are stable calcifications adjacent to both greater trochanters. Increased central density in the pelvis suggests a distended bladder.  IMPRESSION: No acute osseous findings demonstrated at either hip. If the patient has persistent hip pain or inability to bear weight, follow up imaging may be warranted as hip fractures can be radiographically occult in the elderly.   Electronically Signed   By: Roxy HorsemanBill  Veazey M.D.   On: 09/27/2014 19:35   Ct Head Wo Contrast  09/27/2014   CLINICAL DATA:  Fall, found on floor  EXAM: CT HEAD WITHOUT CONTRAST  CT CERVICAL SPINE WITHOUT CONTRAST  TECHNIQUE: Multidetector CT imaging of the head and cervical spine was performed following the standard protocol without intravenous contrast. Multiplanar CT image reconstructions of the cervical spine were also generated.  COMPARISON:  None.  FINDINGS: CT HEAD FINDINGS  Motion degraded images.  No evidence of parenchymal hemorrhage or extra-axial fluid collection. No mass lesion, mass effect, or midline shift.  No CT evidence of acute infarction.  Subcortical white matter and periventricular small vessel ischemic changes. Intracranial atherosclerosis.  Age related atrophy.  No ventriculomegaly.  The visualized paranasal sinuses are essentially clear. The mastoid air cells are unopacified.  No evidence of calvarial fracture.  CT CERVICAL SPINE FINDINGS  Motion degraded images.  Exaggerated cervical lordosis.  No evidence of fracture or dislocation. Vertebral body heights are maintained. Dens  appears intact.  No prevertebral soft tissue swelling.  Mild to moderate multilevel degenerative changes.  Visualize lungs are notable for biapical pleural parenchymal scarring.  IMPRESSION: No evidence of acute intracranial abnormality. Atrophy with small vessel ischemic changes and intracranial atherosclerosis.  No evidence of traumatic injury to the cervical spine. Mild to moderate degenerative changes.   Electronically Signed   By: Charline BillsSriyesh  Krishnan M.D.   On: 09/27/2014 19:15   Ct Cervical Spine Wo Contrast  09/27/2014   CLINICAL DATA:  Fall, found on floor  EXAM: CT HEAD WITHOUT CONTRAST  CT CERVICAL SPINE WITHOUT CONTRAST  TECHNIQUE: Multidetector CT imaging of the head and cervical spine was performed following the standard protocol without intravenous contrast. Multiplanar CT image reconstructions of the cervical spine were also generated.  COMPARISON:  None.  FINDINGS: CT HEAD FINDINGS  Motion degraded images.  No evidence of parenchymal hemorrhage or extra-axial fluid collection. No mass lesion, mass effect, or midline shift.  No CT evidence of acute infarction.  Subcortical white matter and periventricular small vessel ischemic changes. Intracranial atherosclerosis.  Age related atrophy.  No ventriculomegaly.  The visualized paranasal sinuses are essentially clear. The mastoid air cells are unopacified.  No evidence of calvarial fracture.  CT CERVICAL SPINE FINDINGS  Motion degraded images.  Exaggerated cervical lordosis.  No evidence of fracture or dislocation. Vertebral body heights are maintained. Dens appears intact.  No prevertebral soft tissue swelling.  Mild to moderate multilevel degenerative changes.  Visualize lungs are notable for biapical pleural parenchymal scarring.  IMPRESSION: No evidence of acute intracranial abnormality. Atrophy with small vessel ischemic changes and intracranial atherosclerosis.  No evidence of traumatic injury to the cervical spine. Mild to moderate degenerative  changes.   Electronically Signed  By: Charline Bills M.D.   On: 09/27/2014 19:15     EKG Interpretation None      MDM   Final diagnoses:  Fall, initial encounter    Patient status post fall at or around 12 noon 1145 according to the patient was on the ground for several hours. CPK is normal. Outpatient x-rays of both hips negative for fracture. CT of head and neck without any acute injuries. Basic labs without any sniffing abnormalities. Patient did not have a syncopal episode she clearly remembers tripping and falling. No specific injuries. Patient stable for discharge home.  I personally performed the services described in this documentation, which was scribed in my presence. The recorded information has been reviewed and is accurate.     Vanetta Mulders, MD 09/27/14 2150   Addendum: Patient has developed fairly significant pain with range of motion of the right hip. Did not seem to have much of that on presentation. Based on this do not want to overlook a hairline fracture of the right hip so we'll CT her hip. Range of motion of the left hip without any significant pain.  Vanetta Mulders, MD 09/27/14 2207   Addendum: Patient's CT scan does not show a hip fracture but shows fractures of the right superior and inferior pubic rami. Patient lives by herself not able to ambulate. Admission will be required to the hospitalist team.  Vanetta Mulders, MD 09/27/14 2314

## 2014-09-28 ENCOUNTER — Encounter (HOSPITAL_COMMUNITY): Payer: Self-pay | Admitting: Internal Medicine

## 2014-09-28 DIAGNOSIS — S329XXA Fracture of unspecified parts of lumbosacral spine and pelvis, initial encounter for closed fracture: Secondary | ICD-10-CM

## 2014-09-28 DIAGNOSIS — W19XXXA Unspecified fall, initial encounter: Secondary | ICD-10-CM

## 2014-09-28 DIAGNOSIS — IMO0001 Reserved for inherently not codable concepts without codable children: Secondary | ICD-10-CM | POA: Diagnosis present

## 2014-09-28 DIAGNOSIS — E039 Hypothyroidism, unspecified: Secondary | ICD-10-CM

## 2014-09-28 LAB — COMPREHENSIVE METABOLIC PANEL
ALBUMIN: 3.3 g/dL — AB (ref 3.5–5.2)
ALK PHOS: 48 U/L (ref 39–117)
ALT: 14 U/L (ref 0–35)
ANION GAP: 9 (ref 5–15)
AST: 20 U/L (ref 0–37)
BUN: 14 mg/dL (ref 6–23)
CO2: 29 mEq/L (ref 19–32)
Calcium: 8.8 mg/dL (ref 8.4–10.5)
Chloride: 99 mEq/L (ref 96–112)
Creatinine, Ser: 0.84 mg/dL (ref 0.50–1.10)
GFR calc non Af Amer: 55 mL/min — ABNORMAL LOW (ref >90)
GFR, EST AFRICAN AMERICAN: 64 mL/min — AB (ref >90)
GLUCOSE: 98 mg/dL (ref 70–99)
POTASSIUM: 4 meq/L (ref 3.7–5.3)
SODIUM: 137 meq/L (ref 137–147)
Total Bilirubin: 0.9 mg/dL (ref 0.3–1.2)
Total Protein: 6.6 g/dL (ref 6.0–8.3)

## 2014-09-28 LAB — CBC
HCT: 33.8 % — ABNORMAL LOW (ref 36.0–46.0)
Hemoglobin: 11.2 g/dL — ABNORMAL LOW (ref 12.0–15.0)
MCH: 30.4 pg (ref 26.0–34.0)
MCHC: 33.1 g/dL (ref 30.0–36.0)
MCV: 91.8 fL (ref 78.0–100.0)
Platelets: 234 10*3/uL (ref 150–400)
RBC: 3.68 MIL/uL — ABNORMAL LOW (ref 3.87–5.11)
RDW: 13.7 % (ref 11.5–15.5)
WBC: 7.3 10*3/uL (ref 4.0–10.5)

## 2014-09-28 MED ORDER — HYDROCODONE-ACETAMINOPHEN 5-325 MG PO TABS
1.0000 | ORAL_TABLET | ORAL | Status: DC | PRN
  Administered 2014-09-28 – 2014-09-29 (×2): 1 via ORAL
  Filled 2014-09-28 (×2): qty 1

## 2014-09-28 MED ORDER — ONDANSETRON HCL 4 MG/2ML IJ SOLN: 4.0000 mg | Freq: Three times a day (TID) | INTRAMUSCULAR | Status: DC | PRN

## 2014-09-28 MED ORDER — TIMOLOL HEMIHYDRATE 0.5 % OP SOLN
1.0000 [drp] | Freq: Every day | OPHTHALMIC | Status: DC
Start: 2014-09-28 — End: 2014-09-28

## 2014-09-28 MED ORDER — HYDROCODONE-ACETAMINOPHEN 5-325 MG PO TABS
1.0000 | ORAL_TABLET | ORAL | Status: DC | PRN
  Administered 2014-09-28: 1 via ORAL
  Filled 2014-09-28: qty 1

## 2014-09-28 MED ORDER — LEVOTHYROXINE SODIUM 75 MCG PO TABS
75.0000 ug | ORAL_TABLET | Freq: Every day | ORAL | Status: DC
  Administered 2014-09-28 – 2014-09-29 (×2): 75 ug via ORAL
  Filled 2014-09-28 (×2): qty 1

## 2014-09-28 MED ORDER — TIMOLOL MALEATE 0.5 % OP SOLN
1.0000 [drp] | Freq: Every day | OPHTHALMIC | Status: DC
  Administered 2014-09-28: 1 [drp] via OPHTHALMIC
  Filled 2014-09-28: qty 5

## 2014-09-28 MED ORDER — ONDANSETRON HCL 4 MG PO TABS: 4.0000 mg | ORAL_TABLET | Freq: Three times a day (TID) | ORAL | Status: DC | PRN

## 2014-09-28 MED ORDER — ASPIRIN EC 81 MG PO TBEC: 81.0000 mg | DELAYED_RELEASE_TABLET | ORAL | Status: DC | PRN

## 2014-09-28 MED ORDER — SODIUM CHLORIDE 0.9 % IV SOLN
INTRAVENOUS | Status: DC
  Administered 2014-09-28: 01:00:00 via INTRAVENOUS

## 2014-09-28 MED ORDER — INFLUENZA VAC SPLIT QUAD 0.5 ML IM SUSY
0.5000 mL | PREFILLED_SYRINGE | INTRAMUSCULAR | Status: DC
  Filled 2014-09-28: qty 0.5

## 2014-09-28 MED ORDER — ENOXAPARIN SODIUM 30 MG/0.3ML ~~LOC~~ SOLN
30.0000 mg | SUBCUTANEOUS | Status: DC
  Administered 2014-09-28 – 2014-09-29 (×2): 30 mg via SUBCUTANEOUS
  Filled 2014-09-28 (×2): qty 0.3

## 2014-09-28 MED ORDER — ACETAMINOPHEN 325 MG PO TABS: 650.0000 mg | ORAL_TABLET | Freq: Four times a day (QID) | ORAL | Status: DC | PRN

## 2014-09-28 NOTE — Care Management Note (Unsigned)
    Page 1 of 1   09/28/2014     5:56:47 PM CARE MANAGEMENT NOTE 09/28/2014  Patient:  Tina Lane,Tina Lane   Account Number:  [id number]  Date Initiated:  09/28/2014  Documentation initiated by:  Anibal HendersonBOLDEN,Sheyla Zaffino  Subjective/Objective Assessment:   Admitted with pelvic fx. Pt is [age]yr old, still lives at home and is independent. She has a son who is active in her care, Harrold Donathathan, but she is very independent and does her own ADLs. She wants to return home, but understands she will do     Action/Plan:   better if she goes to rehab for a couple of weeks first, so says she will do this. Referred to CSW.  NOTE: very HOH   Anticipated DC Date:  09/29/2014   Anticipated DC Plan:  SKILLED NURSING FACILITY  In-house referral  Clinical Social Worker      DC Planning Services  CM consult      Choice offered to / List presented to:             Status of service:  In process, will continue to follow Medicare Important Message given?   (If response is "NO", the following Medicare IM given date fields will be blank) Date Medicare IM given:   Medicare IM given by:   Date Additional Medicare IM given:   Additional Medicare IM given by:    Discharge Disposition:    Per UR Regulation:  Reviewed for med. necessity/level of care/duration of stay  If discussed at Long Length of Stay Meetings, dates discussed:    Comments:  09/28/14 1750 Anibal HendersonGeneva Zyan Coby RN/CM

## 2014-09-28 NOTE — Consult Note (Signed)
Reason for Consult: right pelvic fracture  Referring Physician: Dr Benjaman Kindler is an 80 y.o. female.  HPI: taken from Dr Sharl Ma H/P  80 year old female who   has a past medical history of Glaucoma; Thyroid disease; GERD (gastroesophageal reflux disease); and HOH (hard of hearing). Today patient was brought to the ED after patient fell at home and was unable to get up. Patient has hearing disability, so undergo to provide significant history. History of from the patient's son. As per patient's son she lives by herself and has been very actively doing her ADLs. Today when she was taking out the wet towels  from the basement she tripped on the doorstep and fell. Patient lay on the floor for 4 hours and she was unable to get up. In the ED x-ray of the hip and pelvis did not show fracture but the CT of the right hip showed nondisplaced fracture of the right superior and inferior pubic rami. Patient denies any other symptoms no nausea vomiting or diarrhea. Did not pass out. No chest pain or shortness of breath. No fever dysuria urgency frequency of urination..  She told me I was trying to be so careful and I fell anyway  Past Medical History  Diagnosis Date  . Glaucoma   . Thyroid disease   . GERD (gastroesophageal reflux disease)   . HOH (hard of hearing)     Past Surgical History  Procedure Laterality Date  . Abdominal surgery      Family History  Problem Relation Age of Onset  . Cancer Sister     unknown  . Cancer Sister     unknown  . Coronary artery disease Father   . Cancer Mother     died age 43    Social History:  reports that she has never smoked. She has never used smokeless tobacco. She reports that she does not drink alcohol or use illicit drugs.  Allergies:  Allergies  Allergen Reactions  . Latex Itching  . Onion Nausea And Vomiting  . Other     SPANDEX ALLERGY  . Penicillins Other (See Comments)    UNKNOWN REACTION  . Sulfa Antibiotics Other (See  Comments)    UNKNOWN REACTION    Medications: I have reviewed the patient's current medications.  Review of Systems  All other systems reviewed and are negative.  I m hard of hearing , I have a lot of bruises  Blood pressure 127/60, pulse 86, temperature 98 F (36.7 C), temperature source Oral, resp. rate 16, weight 121 lb (54.885 kg), SpO2 96.00%. Physical Exam  Nursing note and vitals reviewed. Constitutional: She appears well-developed and well-nourished.  Thin   HENT:  Head: Normocephalic and atraumatic.  Eyes: Right eye exhibits no discharge. Left eye exhibits no discharge.  Neck: Neck supple. No JVD present. No tracheal deviation present. No thyromegaly present.  Cardiovascular: Normal rate and intact distal pulses.   Respiratory: Effort normal.  GI: Soft. She exhibits no distension. There is no tenderness.  Musculoskeletal: She exhibits no edema.       Right hip: She exhibits decreased range of motion, tenderness and bony tenderness. She exhibits no swelling, no crepitus, no deformity and no laceration.       Left hip: Normal.       Right knee: Normal.       Left knee: Normal.       Right ankle: Normal.       Left ankle: Normal.  Legs: Kyphosis of the spine no tenderness   Upper extremities no tenderness, normal alignment, rom and muscle tone   Lymphadenopathy:    She has no cervical adenopathy.  Neurological: She is alert. She displays normal reflexes. She exhibits normal muscle tone.  Skin: Skin is warm and dry.  Subcutaneous bruising various areas  Psychiatric: She has a normal mood and affect. Her behavior is normal. Judgment and thought content normal.    Assessment/Plan:   IMPRESSION: Nondisplaced fractures of the right superior and inferior pubic rami. Degenerative changes in the right hip.   IMPRESSION: No acute osseous findings demonstrated at either hip. If the patient has persistent hip pain or inability to bear weight, follow up imaging may be  warranted as hip fractures can be radiographically occult in the elderly.  NON SURGICAL TREATMENT  6 WEEKS WBAT WITH [facility]ALK[facility] D[facility]   Fraidy Mccarrick 09/28/2014, 9:21 AM

## 2014-09-28 NOTE — Clinical Social Work Placement (Signed)
Clinical Social Work Department CLINICAL SOCIAL WORK PLACEMENT NOTE 09/28/2014  Patient:  [name],Tina M  Account Number:  [id number] Admit date:  09/27/2014  Clinical Social Worker:  Derenda FennelKARA Inza Mikrut, LCSW Date/time:  09/28/2014 01:03 PM  Clinical Social Work is seeking post-discharge placement for this patient at the following level of care:   SKILLED NURSING   (*CSW will update this form in Epic as items are completed)   09/28/2014  Patient/family provided with [facility] Clinical [facility] Work's list of facilities offering this level of care within the geographic area requested by the patient (or if unable, by the patient's family).  09/28/2014  Patient/family informed of their freedom to choose among providers that offer the needed level of care, that participate in Medicare, Medicaid or managed care program needed by the patient, have an available bed and are willing to accept the patient.  09/28/2014  Patient/family informed of [facility]' ownership interest in [facility], as well as of the fact that they are under no obligation to receive care at this facility.  PASARR submitted to EDS on 09/28/2014 PASARR number received on 09/28/2014  FL2 transmitted to all facilities in geographic area requested by pt/family on  09/28/2014 FL2 transmitted to all facilities within larger geographic area on   Patient informed that his/her managed care company has contracts with or will negotiate with  certain facilities, including the following:     Patient/family informed of bed offers received:   Patient chooses bed at  Physician recommends and patient chooses bed at    Patient to be transferred to  on   Patient to be transferred to facility by  Patient and family notified of transfer on  Name of family member notified:    The following physician request were entered in Epic:   Additional Comments:  Derenda FennelKara Briele Lagasse, LCSW [phone]

## 2014-09-28 NOTE — Evaluation (Signed)
Physical Therapy Evaluation Patient Details Name: Tina Lane MRN: [id number] DOB: June 13, 1912 Today's Date: 09/28/2014   History of Present Illness  80 year old female who   has a past medical history of Glaucoma; Thyroid disease; GERD (gastroesophageal reflux disease); and HOH (hard of hearing).  The pt fell fracturing her Rt pelvic rami.  She is now being referred to therapy to improve her I in mobility.   Clinical Impression  Pt is very agreeable to work with therapy.  At this time the patient's main limitation is pain.  She should progress well but at 89 it will take time before she will be safe to ambulate by herself.  Pt will benefit from skilled therapy to improve pt I in ambulation.     Follow Up Recommendations SNF    Equipment Recommendations  None recommended by PT    Recommendations for Other Services   OT     Precautions / Restrictions Precautions Precautions: Fall Restrictions Weight Bearing Restrictions: Yes RLE Weight Bearing: Weight bearing as tolerated      Mobility  Bed Mobility Overal bed mobility: Needs Assistance Bed Mobility: Supine to Sit     Supine to sit: Max assist        Transfers Overall transfer level: Needs assistance   Transfers: Sit to/from Stand Sit to Stand: Min assist            Ambulation/Gait Ambulation/Gait assistance: Mod assist Ambulation Distance (Feet): 2 Feet Assistive device: Rolling walker (2 wheeled) Gait Pattern/deviations: Step-to pattern   Gait velocity interpretation: Below normal speed for age/gender           Pertinent Vitals/Pain Pain Assessment: 0-10 Pain Score: 5  Pain Descriptors / Indicators: Aching Pain Intervention(s): Repositioned    Home Living Family/patient expects to be discharged to:: Private residence Living Arrangements: Alone Available Help at Discharge: Available PRN/intermittently;Family Type of Home: House Home Access: Level entry     Home Layout: Two level Home  Equipment: Walker - 2 wheels;Cane - single point      Prior Function Level of Independence: Independent with assistive device(s)         Comments: Pt cares for her two cats.     Hand Dominance   Dominant Hand: Right    Extremity/Trunk Assessment               Lower Extremity Assessment: RLE deficits/detail         Communication   Communication: HOH  Cognition Arousal/Alertness: Awake/alert   Overall Cognitive Status: Within Functional Limits for tasks assessed                         Exercises General Exercises - Lower Extremity Ankle Circles/Pumps: 10 reps Quad Sets: 10 reps Gluteal Sets: 10 reps Heel Slides: 5 reps      Assessment/Plan    PT Assessment Patient needs continued PT services  PT Diagnosis Difficulty walking;Generalized weakness;Acute pain   PT Problem List Decreased strength;Decreased activity tolerance;Decreased balance;Pain;Decreased mobility  PT Treatment Interventions Gait training;Therapeutic exercise;Balance training   PT Goals (Current goals can be found in the Care Plan section)      Frequency 7X/week           End of Session Equipment Utilized During Treatment: Gait belt Activity Tolerance: Patient limited by pain Patient left: in bed;with call bell/phone within reach           Time: 1100-1129 PT Time Calculation (min): 29 min   Charges:  PT Evaluation $Initial PT Evaluation Tier I: 1 Procedure     PT G Codes:          RUSSELL,CINDY 10/05/14, 11:29 AM

## 2014-09-28 NOTE — Progress Notes (Signed)
Late entry:  1257 Patient denies pain.  Stated that she, "Does not need anything for pain."  Niece and nephew in room.  Patient stated to family that she, "Knew when she needed pain medicine, and she does not need any medicine."

## 2014-09-28 NOTE — Clinical Social Work Psychosocial (Signed)
Clinical Social Work Department BRIEF PSYCHOSOCIAL ASSESSMENT 09/28/2014  Patient:  Tina Lane, Tina Lane     Account Number:  [id number]     Admit date:  09/27/2014  Clinical Social Worker:  Tina Lane  Date/Time:  09/28/2014 01:05 PM  Referred by:  CSW  Date Referred:  09/28/2014 Referred for  SNF Placement   Other Referral:   Interview type:  Patient Other interview type:   niece and nephew at bedside  called son- Tina Lane    PSYCHOSOCIAL DATA Living Status:  ALONE Admitted from facility:   Level of care:   Primary support name:  Tina Lane Primary support relationship to patient:  CHILD, ADULT Degree of support available:   supportive    CURRENT CONCERNS Current Concerns  Post-Acute Placement   Other Concerns:    SOCIAL WORK ASSESSMENT / PLAN CSW met with pt and pt's niece and nephew at bedside. Pt alert and oriented, but very hard of hearing. She reports she lives alone and manages very well at home. Pt has one living child, her son Tina Lane. He comes and checks on pt daily. Pt said she was getting up water in the basement when she fell. She was unable to get up, but scooted herself across the floor. Pt was on the floor for about four hours before Tina Lane found her and called EMS. Pt states she has been living in the basement since before her husband died in 03/07/2002. Her son was worried that pt would fall on stairs so recommended that she just continue to stay there. She is completely independent and enjoys walking in the driveway for exercise every day. Pt admitted with pelvic fracture. PT evaluated pt and recommendation is for SNF. CSW discussed placement process and [employer] authorization. Pt was very positive while talking about rehab. She said she knew she needed to be able to walk independently again before she could return home. CSW called pt's son and shared information with him as well. CSW will initiate bed search and follow up.   Assessment/plan status:  Psychosocial  Support/Ongoing Assessment of Needs Other assessment/ plan:   Information/referral to community resources:   SNF list    PATIENT'S/FAMILY'S RESPONSE TO PLAN OF CARE: Pt and family are agreeable to short term SNF at d/c.       Tina Lane, Tina Lane

## 2014-09-28 NOTE — Progress Notes (Signed)
[facility] PROGRESS NOTE  Tina Lane XBJ:[id number] DOB: [birth year] DOA: 09/27/2014 PCP: [name], MD  Assessment/Plan: Pelvic fracture  Related to mechanical fall at home.  Evaluated by ortho who reviewed xrays and CT of right hip opine nondisplaced fractures of the right superior and inferior pubic rami. No surgery required. Recommending 6 weeks WBAT with walker. Evaluated by PT who recommend snf. Social work initiated Nutritional therapist. Continue Vicodin when necessary for pain   Hypothyroidism   will check TSH.  Continue Synthroid   Venous stasis dermatitis Chronic. Stable at baseline.   HOH Severe. Difficult to communicate. She can see and read notes. No hearing aids per son.   GERD Stable at discharge  DVT prophylaxis  Lovenox     Code Status: full Family Communication: son at bedside Disposition Plan: snf likely tomorrow   Consultants:  Dr Romeo Apple orthopedics  Procedures:  none  Antibiotics:  none  HPI/Subjective: Sitting up in bed. Denies pain. Reports "ill probably need pain medicine tonight". Very HOH and difficult to communicate. Can see to read notes.   Objective: Filed Vitals:   09/28/14 1531  BP: 113/49  Pulse: 95  Temp: 99 F (37.2 C)  Resp: 18   No intake or output data in the 24 hours ending 09/28/14 1547 Filed Weights   09/28/14 0044  Weight: 54.885 kg (121 lb)    Exam:   General:  Well nourished slightly frail appearing, no acute distress  Cardiovascular: RRR no MGR No LE edema. Venus stasis changes bilateral LE  Respiratory: normal effort BS clear to auscultation bilaterally No wheeze  Abdomen: soft +BS non-tender to palpation  Musculoskeletal:  Right hip with decreased ROM due to pain. Non tender to touch. Other joints without swelling/erythema  Data Reviewed: Basic Metabolic Panel:  Recent Labs Lab 09/27/14 2000 09/28/14 0614  NA 135* 137  K 4.0 4.0  CL 96 99  CO2 26 29  GLUCOSE 136* 98  BUN 13 14   CREATININE 0.84 0.84  CALCIUM 9.9 8.8   Liver Function Tests:  Recent Labs Lab 09/28/14 0614  AST 20  ALT 14  ALKPHOS 48  BILITOT 0.9  PROT 6.6  ALBUMIN 3.3*   No results found for this basename: LIPASE, AMYLASE,  in the last 168 hours No results found for this basename: AMMONIA,  in the last 168 hours CBC:  Recent Labs Lab 09/27/14 2000 09/28/14 0614  WBC 9.8 7.3  NEUTROABS 8.4*  --   HGB 13.0 11.2*  HCT 38.4 33.8*  MCV 92.3 91.8  PLT 246 234   Cardiac Enzymes:  Recent Labs Lab 09/27/14 2000  CKTOTAL 146   BNP (last 3 results) No results found for this basename: PROBNP,  in the last 8760 hours CBG: No results found for this basename: GLUCAP,  in the last 168 hours  No results found for this or any previous visit (from the past 240 hour(s)).   Studies: Dg Chest 1 View  09/27/2014   CLINICAL DATA:  Fall.  EXAM: CHEST - 1 VIEW  COMPARISON:  July 16, 2014.  FINDINGS: The heart size and mediastinal contours are within normal limits. Both lungs are clear. No pneumothorax or pleural effusion is noted. The visualized skeletal structures are unremarkable.  IMPRESSION: No acute cardiopulmonary abnormality seen.   Electronically Signed   By: Roque Lias M.D.   On: 09/27/2014 19:35   Dg Hip Bilateral W/pelvis  09/27/2014   CLINICAL DATA:  Right hip pain after falling.  EXAM:  BILATERAL HIP WITH PELVIS - 4+ VIEW  COMPARISON:  Pelvic CT 08/07/2011.  FINDINGS: The bones are demineralized. There is no evidence of acute fracture or dislocation. There are stable mild degenerative changes of both hips and sacroiliac joints. There are stable calcifications adjacent to both greater trochanters. Increased central density in the pelvis suggests a distended bladder.  IMPRESSION: No acute osseous findings demonstrated at either hip. If the patient has persistent hip pain or inability to bear weight, follow up imaging may be warranted as hip fractures can be radiographically occult in the  elderly.   Electronically Signed   By: Roxy Horseman M.D.   On: 09/27/2014 19:35   Ct Head Wo Contrast  09/27/2014   CLINICAL DATA:  Fall, found on floor  EXAM: CT HEAD WITHOUT CONTRAST  CT CERVICAL SPINE WITHOUT CONTRAST  TECHNIQUE: Multidetector CT imaging of the head and cervical spine was performed following the standard protocol without intravenous contrast. Multiplanar CT image reconstructions of the cervical spine were also generated.  COMPARISON:  None.  FINDINGS: CT HEAD FINDINGS  Motion degraded images.  No evidence of parenchymal hemorrhage or extra-axial fluid collection. No mass lesion, mass effect, or midline shift.  No CT evidence of acute infarction.  Subcortical white matter and periventricular small vessel ischemic changes. Intracranial atherosclerosis.  Age related atrophy.  No ventriculomegaly.  The visualized paranasal sinuses are essentially clear. The mastoid air cells are unopacified.  No evidence of calvarial fracture.  CT CERVICAL SPINE FINDINGS  Motion degraded images.  Exaggerated cervical lordosis.  No evidence of fracture or dislocation. Vertebral body heights are maintained. Dens appears intact.  No prevertebral soft tissue swelling.  Mild to moderate multilevel degenerative changes.  Visualize lungs are notable for biapical pleural parenchymal scarring.  IMPRESSION: No evidence of acute intracranial abnormality. Atrophy with small vessel ischemic changes and intracranial atherosclerosis.  No evidence of traumatic injury to the cervical spine. Mild to moderate degenerative changes.   Electronically Signed   By: Charline Bills M.D.   On: 09/27/2014 19:15   Ct Cervical Spine Wo Contrast  09/27/2014   CLINICAL DATA:  Fall, found on floor  EXAM: CT HEAD WITHOUT CONTRAST  CT CERVICAL SPINE WITHOUT CONTRAST  TECHNIQUE: Multidetector CT imaging of the head and cervical spine was performed following the standard protocol without intravenous contrast. Multiplanar CT image  reconstructions of the cervical spine were also generated.  COMPARISON:  None.  FINDINGS: CT HEAD FINDINGS  Motion degraded images.  No evidence of parenchymal hemorrhage or extra-axial fluid collection. No mass lesion, mass effect, or midline shift.  No CT evidence of acute infarction.  Subcortical white matter and periventricular small vessel ischemic changes. Intracranial atherosclerosis.  Age related atrophy.  No ventriculomegaly.  The visualized paranasal sinuses are essentially clear. The mastoid air cells are unopacified.  No evidence of calvarial fracture.  CT CERVICAL SPINE FINDINGS  Motion degraded images.  Exaggerated cervical lordosis.  No evidence of fracture or dislocation. Vertebral body heights are maintained. Dens appears intact.  No prevertebral soft tissue swelling.  Mild to moderate multilevel degenerative changes.  Visualize lungs are notable for biapical pleural parenchymal scarring.  IMPRESSION: No evidence of acute intracranial abnormality. Atrophy with small vessel ischemic changes and intracranial atherosclerosis.  No evidence of traumatic injury to the cervical spine. Mild to moderate degenerative changes.   Electronically Signed   By: Charline Bills M.D.   On: 09/27/2014 19:15   Ct Hip Right Wo Contrast  09/27/2014   CLINICAL  DATA:  Severe right hip pain after a fall. No acute abnormality on plain radiographs.  EXAM: CT OF THE RIGHT HIP WITHOUT CONTRAST  TECHNIQUE: Multidetector CT imaging of the right hip was performed according to the standard protocol. Multiplanar CT image reconstructions were also generated.  COMPARISON:  Right hip radiographs 09/27/2014  FINDINGS: There are acute appearing nondisplaced fractures of the right superior and inferior pubic rami. Diffuse bone demineralization. The right hip appears intact. No fractures demonstrated in the proximal femur or acetabular region. No dislocation. Prominent degenerative changes in the right hip with osteophytes on the  femoral and acetabular sides. Soft tissue calcification adjacent to the greater trochanter consistent with dystrophic calcification or synovial osteochondromatosis. Soft tissues appear intact. No significant hematoma. Vascular calcifications.  IMPRESSION: Nondisplaced fractures of the right superior and inferior pubic rami. Degenerative changes in the right hip.   Electronically Signed   By: Burman NievesWilliam  Stevens M.D.   On: 09/27/2014 22:58    Scheduled Meds: . enoxaparin (LOVENOX) injection  30 mg Subcutaneous Q24H  . [START ON 09/29/2014] Influenza vac split quadrivalent PF  0.5 mL Intramuscular Tomorrow-1000  . levothyroxine  75 mcg Oral QAC breakfast  . timolol  1 drop Both Eyes QHS   Continuous Infusions: . sodium chloride 10 mL/hr at 09/28/14 0124    Principal Problem:   Pelvic fracture Active Problems:   Hypothyroidism   GERD (gastroesophageal reflux disease)   Chronic venous stasis dermatitis of both lower extremities   Pelvic rim fracture    Time spent: 35 minutes    [facility] Hospital[facility]ACK,Keinan Brouillet M  [facility] Pager [phone]. If 7PM-7AM, please contact night-coverage at [email or web address], password [email or web address] 09/28/2014, 3:47 PM  LOS: 1 day

## 2014-09-28 NOTE — Progress Notes (Signed)
Patient seen, independently examined and chart reviewed. I agree with exam, assessment and plan discussed with Tina SmothersKaren Black, NP.  PMH Glaucoma, GERD  Subjective: Overall doing okay  Objective: Afebrile, vital signs stable. No hypoxia.  Gen. Alert, speech fluent and clear. Somewhat hard of hearing.  Cardiovascular. Regular rate and rhythm. No murmur, gallop.  Respiratory. Clear to auscultation bilaterally. No wheezes, rales or rhonchi. Normal respiratory effort.  Abdomen. Soft   Complete metabolic panel unremarkable. CBC stable. CT of hip noted.  Very pleasant 80 year old woman who fell at home sustaining pelvic fracture. Orthopedics has recommended weightbearing as tolerated. Skilled nursing facility is being pursued.  Tina Sacksaniel Adya Wirz, MD [facility] [phone]

## 2014-09-28 NOTE — H&P (Signed)
PCP:   Willow OraANDY,[name], MD   Chief Complaint:  Fall  HPI: 80 year old female who   has a past medical history of Glaucoma; Thyroid disease; GERD (gastroesophageal reflux disease); and HOH (hard of hearing). Today patient was brought to the ED after patient fell at home and was unable to get up. Patient has hearing disability, so undergo to provide significant history. History of from the patient's son. As per patient's son she lives by herself and has been very actively doing her ADLs. Today when she was taking out the wet towels  from the basement she tripped on the doorstep and fell. Patient lay on the floor for 4 hours and she was unable to get up. In the ED x-ray of the hip and pelvis did not show fracture but the CT of the right hip showed nondisplaced fracture of the right superior and inferior pubic rami. Patient denies any other symptoms no nausea vomiting or diarrhea. Did not pass out. No chest pain or shortness of breath. No fever dysuria urgency frequency of urination..  Allergies:   Allergies  Allergen Reactions  . Latex Itching  . Onion Nausea And Vomiting  . Other     SPANDEX ALLERGY  . Penicillins Other (See Comments)    UNKNOWN REACTION  . Sulfa Antibiotics Other (See Comments)    UNKNOWN REACTION      Past Medical History  Diagnosis Date  . Glaucoma   . Thyroid disease   . GERD (gastroesophageal reflux disease)   . HOH (hard of hearing)     Past Surgical History  Procedure Laterality Date  . Abdominal surgery      Prior to Admission medications   Medication Sig Start Date End Date Taking? Authorizing Provider  calcium carbonate (TUMS EX) 750 MG chewable tablet Chew 1 tablet by mouth at bedtime.    Yes Historical Provider, MD  Calcium Carbonate-Vitamin D (CALCIUM 600 + D PO) Take 1 tablet by mouth every morning.    Yes Historical Provider, MD  Cholecalciferol (VITAMIN D3) 1000 UNITS CAPS Take 1 capsule by mouth every morning.    Yes Historical  Provider, MD  levothyroxine (SYNTHROID, LEVOTHROID) 75 MCG tablet Take 75 mcg by mouth daily before breakfast.   Yes Historical Provider, MD  polycarbophil (FIBERCON) 625 MG tablet Take 625 mg by mouth at bedtime.   Yes Historical Provider, MD  timolol (BETIMOL) 0.5 % ophthalmic solution Place 1 drop into both eyes at bedtime.     Yes Historical Provider, MD  aspirin EC 81 MG tablet Take 81 mg by mouth as needed (chest pain).    Historical Provider, MD    Social History:  reports that she has never smoked. She has never used smokeless tobacco. She reports that she does not drink alcohol or use illicit drugs.  Family History  Problem Relation Age of Onset  . Cancer Sister     unknown  . Cancer Sister     unknown  . Coronary artery disease Father   . Cancer Mother     died age 78     All the positives are listed in BOLD  Review of Systems:  HEENT: Headache, blurred vision, runny nose, sore throat Neck: Hypothyroidism, hyperthyroidism,,lymphadenopathy Chest : Shortness of breath, history of COPD, Asthma Heart : Chest pain, history of coronary arterey disease GI:  Nausea, vomiting, diarrhea, constipation, GERD GU: Dysuria, urgency, frequency of urination, hematuria Neuro: Stroke, seizures, syncope Psych: Depression, anxiety, hallucinations   Physical Exam: Blood pressure  143/66, pulse 97, temperature 98 F (36.7 C), temperature source Oral, resp. rate 24, SpO2 94.00%. Constitutional:   Patient is a well-developed and well-nourished female* in no acute distress and cooperative with exam. Head: Normocephalic and atraumatic Mouth: Mucus membranes moist Eyes: PERRL, EOMI, conjunctivae normal Neck: Supple, No Thyromegaly Cardiovascular: RRR, S1 normal, S2 normal Pulmonary/Chest: CTAB, no wheezes, rales, or rhonchi Abdominal: Soft. Non-tender, non-distended, bowel sounds are normal, no masses, organomegaly, or guarding present.  Neurological: A&O x3, Strenght is normal and  symmetric bilaterally, cranial nerve II-XII are grossly intact, no focal motor deficit, sensory intact to light touch bilaterally.  Extremities : No Cyanosis, Clubbing or Edema  Labs on Admission:  Basic Metabolic Panel:  Recent Labs Lab 09/27/14 2000  NA 135*  K 4.0  CL 96  CO2 26  GLUCOSE 136*  BUN 13  CREATININE 0.84  CALCIUM 9.9   CBC:  Recent Labs Lab 09/27/14 2000  WBC 9.8  NEUTROABS 8.4*  HGB 13.0  HCT 38.4  MCV 92.3  PLT 246   Cardiac Enzymes:  Recent Labs Lab 09/27/14 2000  CKTOTAL 146    BNP (last 3 results) No results found for this basename: PROBNP,  in the last 8760 hours CBG: No results found for this basename: GLUCAP,  in the last 168 hours  Radiological Exams on Admission: Dg Chest 1 View  09/27/2014   CLINICAL DATA:  Fall.  EXAM: CHEST - 1 VIEW  COMPARISON:  July 16, 2014.  FINDINGS: The heart size and mediastinal contours are within normal limits. Both lungs are clear. No pneumothorax or pleural effusion is noted. The visualized skeletal structures are unremarkable.  IMPRESSION: No acute cardiopulmonary abnormality seen.   Electronically Signed   By: Roque Lias M.D.   On: 09/27/2014 19:35   Dg Hip Bilateral W/pelvis  09/27/2014   CLINICAL DATA:  Right hip pain after falling.  EXAM: BILATERAL HIP WITH PELVIS - 4+ VIEW  COMPARISON:  Pelvic CT 08/07/2011.  FINDINGS: The bones are demineralized. There is no evidence of acute fracture or dislocation. There are stable mild degenerative changes of both hips and sacroiliac joints. There are stable calcifications adjacent to both greater trochanters. Increased central density in the pelvis suggests a distended bladder.  IMPRESSION: No acute osseous findings demonstrated at either hip. If the patient has persistent hip pain or inability to bear weight, follow up imaging may be warranted as hip fractures can be radiographically occult in the elderly.   Electronically Signed   By: Roxy Horseman M.D.   On:  09/27/2014 19:35   Ct Head Wo Contrast  09/27/2014   CLINICAL DATA:  Fall, found on floor  EXAM: CT HEAD WITHOUT CONTRAST  CT CERVICAL SPINE WITHOUT CONTRAST  TECHNIQUE: Multidetector CT imaging of the head and cervical spine was performed following the standard protocol without intravenous contrast. Multiplanar CT image reconstructions of the cervical spine were also generated.  COMPARISON:  None.  FINDINGS: CT HEAD FINDINGS  Motion degraded images.  No evidence of parenchymal hemorrhage or extra-axial fluid collection. No mass lesion, mass effect, or midline shift.  No CT evidence of acute infarction.  Subcortical white matter and periventricular small vessel ischemic changes. Intracranial atherosclerosis.  Age related atrophy.  No ventriculomegaly.  The visualized paranasal sinuses are essentially clear. The mastoid air cells are unopacified.  No evidence of calvarial fracture.  CT CERVICAL SPINE FINDINGS  Motion degraded images.  Exaggerated cervical lordosis.  No evidence of fracture or dislocation. Vertebral body heights  are maintained. Dens appears intact.  No prevertebral soft tissue swelling.  Mild to moderate multilevel degenerative changes.  Visualize lungs are notable for biapical pleural parenchymal scarring.  IMPRESSION: No evidence of acute intracranial abnormality. Atrophy with small vessel ischemic changes and intracranial atherosclerosis.  No evidence of traumatic injury to the cervical spine. Mild to moderate degenerative changes.   Electronically Signed   By: Charline Bills M.D.   On: 09/27/2014 19:15   Ct Cervical Spine Wo Contrast  09/27/2014   CLINICAL DATA:  Fall, found on floor  EXAM: CT HEAD WITHOUT CONTRAST  CT CERVICAL SPINE WITHOUT CONTRAST  TECHNIQUE: Multidetector CT imaging of the head and cervical spine was performed following the standard protocol without intravenous contrast. Multiplanar CT image reconstructions of the cervical spine were also generated.  COMPARISON:   None.  FINDINGS: CT HEAD FINDINGS  Motion degraded images.  No evidence of parenchymal hemorrhage or extra-axial fluid collection. No mass lesion, mass effect, or midline shift.  No CT evidence of acute infarction.  Subcortical white matter and periventricular small vessel ischemic changes. Intracranial atherosclerosis.  Age related atrophy.  No ventriculomegaly.  The visualized paranasal sinuses are essentially clear. The mastoid air cells are unopacified.  No evidence of calvarial fracture.  CT CERVICAL SPINE FINDINGS  Motion degraded images.  Exaggerated cervical lordosis.  No evidence of fracture or dislocation. Vertebral body heights are maintained. Dens appears intact.  No prevertebral soft tissue swelling.  Mild to moderate multilevel degenerative changes.  Visualize lungs are notable for biapical pleural parenchymal scarring.  IMPRESSION: No evidence of acute intracranial abnormality. Atrophy with small vessel ischemic changes and intracranial atherosclerosis.  No evidence of traumatic injury to the cervical spine. Mild to moderate degenerative changes.   Electronically Signed   By: Charline Bills M.D.   On: 09/27/2014 19:15   Ct Hip Right Wo Contrast  09/27/2014   CLINICAL DATA:  Severe right hip pain after a fall. No acute abnormality on plain radiographs.  EXAM: CT OF THE RIGHT HIP WITHOUT CONTRAST  TECHNIQUE: Multidetector CT imaging of the right hip was performed according to the standard protocol. Multiplanar CT image reconstructions were also generated.  COMPARISON:  Right hip radiographs 09/27/2014  FINDINGS: There are acute appearing nondisplaced fractures of the right superior and inferior pubic rami. Diffuse bone demineralization. The right hip appears intact. No fractures demonstrated in the proximal femur or acetabular region. No dislocation. Prominent degenerative changes in the right hip with osteophytes on the femoral and acetabular sides. Soft tissue calcification adjacent to the  greater trochanter consistent with dystrophic calcification or synovial osteochondromatosis. Soft tissues appear intact. No significant hematoma. Vascular calcifications.  IMPRESSION: Nondisplaced fractures of the right superior and inferior pubic rami. Degenerative changes in the right hip.   Electronically Signed   By: Burman Nieves M.D.   On: 09/27/2014 22:58     Assessment/Plan Principal Problem:   Pelvic fracture Active Problems:   Hypothyroidism   GERD (gastroesophageal reflux disease)   Pelvic rim fracture  Pelvic fracture Patient lives by herself and will need rehabilitation. Will obtain orthopedic consultation in a.m. start Vicodin when necessary for pain  Hypothyroidism Continue Synthroid  DVT prophylaxis Lovenox  Code status: Patient is full code  Family discussion: Admission, patients condition and plan of care including tests being ordered have been discussed with the patient and *her son at bedside who indicate understanding and agree with the plan and Code Status.   Time Spent on Admission: 60 minutes  [facility] S [facility] Pager: [phone] 09/28/2014, 12:37 AM  If 7PM-7AM, please contact night-coverage  [email or web address]  Password [email or web address]

## 2014-09-29 ENCOUNTER — Inpatient Hospital Stay
Admission: RE | Admit: 2014-09-29 | Discharge: 2014-11-29 | Disposition: A | Discharge status: Home / Self Care | Payer: 59 | Source: Ambulatory Visit | Attending: Internal Medicine | Admitting: Internal Medicine

## 2014-09-29 DIAGNOSIS — S60812A Abrasion of left wrist, initial encounter: Secondary | ICD-10-CM | POA: Diagnosis not present

## 2014-09-29 DIAGNOSIS — S79812A Other specified injuries of left hip, initial encounter: Secondary | ICD-10-CM | POA: Diagnosis not present

## 2014-09-29 DIAGNOSIS — Y92009 Unspecified place in unspecified non-institutional (private) residence as the place of occurrence of the external cause: Secondary | ICD-10-CM | POA: Diagnosis not present

## 2014-09-29 DIAGNOSIS — S5012XA Contusion of left forearm, initial encounter: Secondary | ICD-10-CM | POA: Diagnosis not present

## 2014-09-29 DIAGNOSIS — S32511A Fracture of superior rim of right pubis, initial encounter for closed fracture: Secondary | ICD-10-CM | POA: Diagnosis not present

## 2014-09-29 DIAGNOSIS — S79811A Other specified injuries of right hip, initial encounter: Secondary | ICD-10-CM | POA: Diagnosis present

## 2014-09-29 DIAGNOSIS — Z91040 Latex allergy status: Secondary | ICD-10-CM | POA: Diagnosis not present

## 2014-09-29 DIAGNOSIS — Z880 Allergy status to penicillin: Secondary | ICD-10-CM | POA: Diagnosis not present

## 2014-09-29 DIAGNOSIS — W19XXXA Unspecified fall, initial encounter: Secondary | ICD-10-CM | POA: Diagnosis not present

## 2014-09-29 DIAGNOSIS — S329XXA Fracture of unspecified parts of lumbosacral spine and pelvis, initial encounter for closed fracture: Secondary | ICD-10-CM

## 2014-09-29 LAB — TSH: TSH: 0.888 u[IU]/mL (ref 0.350–4.500)

## 2014-09-29 MED ORDER — HYDROCODONE-ACETAMINOPHEN 5-325 MG PO TABS: 1.0000 | ORAL_TABLET | ORAL | Status: DC | PRN

## 2014-09-29 MED ORDER — ACETAMINOPHEN 325 MG PO TABS: 650.0000 mg | ORAL_TABLET | Freq: Four times a day (QID) | ORAL | Status: DC | PRN

## 2014-09-29 NOTE — Clinical Social Work Note (Signed)
Pt d/c today to SNF. CSW presented [facility] not [facility]. Notified pt who requested CSW call son. He is also aware and [facility] Pt to transfer with RN. D/C summary to be faxed upon completion. Son plans to complete paperwork this afternoon.  Derenda FennelKara Andera Cranmer, KentuckyLCSW [phone]

## 2014-09-29 NOTE — Progress Notes (Signed)
[facility].Report called,and [facility] to [facility].  Vital signs stable. Staff to accompanied patient to awaiting facility.

## 2014-09-29 NOTE — Progress Notes (Deleted)
Patient seen, independently examined and chart reviewed. I agree with exam, assessment and plan discussed with [name], NP.  Subjective: Complaints of pain right pelvis otherwise no complaints.  Objective: afebrile, VSS  Gen. Appears calm and comfortable  Psych. Alert, speech fluent and clear  ENT. Very hard of hearing but clearly understands verbal communication  Cardiovascular. RRR no mrg. No LE edema.  Respiratory. CTA bilaterally no wrr. Normal resp effort.  Musculoskeletal. Moves both LE to command. ROM limited on right secondary to pain. Right foot warm and dry.  Doing well. Plan transfer to SNF today for short-term rehab for pelvic fracture. Per ortho 6 weeks WBAT with walker. F/u with ortho in 6 weeks. Tylenol or low dose Lortab for pain.  Brendia Sacksaniel Troy Kanouse, MD [facility] [phone]

## 2014-09-29 NOTE — Discharge Summary (Signed)
Patient seen, independently examined and chart reviewed. I agree with exam, assessment and plan discussed with Tina SmothersKaren Black, NP. I agree with discharge today.  Subjective: Complaints of pain right pelvis otherwise no complaints.  Objective: afebrile, VSS  Gen. Appears calm and comfortable  Psych. Alert, speech fluent and clear  ENT. Very hard of hearing but clearly understands verbal communication  Cardiovascular. RRR no mrg. No LE edema.  Respiratory. CTA bilaterally no wrr. Normal resp effort.  Musculoskeletal. Moves both LE to command. ROM limited on right secondary to pain. Right foot warm and dry.  Doing well. Plan transfer to SNF today for short-term rehab for pelvic fracture. Per ortho 6 weeks WBAT with walker. F/u with ortho in 6 weeks. Tylenol or low dose Lortab for pain.  Tina Sacksaniel Goodrich, MD [facility] [phone]

## 2014-09-29 NOTE — Clinical Social Work Placement (Signed)
Clinical Social Work Department CLINICAL SOCIAL WORK PLACEMENT NOTE 09/29/2014  Patient:  Tina Lane,Tina Lane  Account Number:  [id number] Admit date:  09/27/2014  Clinical Social Worker:  Derenda FennelKARA Polina Burmaster, LCSW  Date/time:  09/28/2014 01:03 PM  Clinical Social Work is seeking post-discharge placement for this patient at the following level of care:   SKILLED NURSING   (*CSW will update this form in Epic as items are completed)   09/28/2014  Patient/family provided with [facility] Department of Clinical Social Work's list of facilities offering this level of care within the geographic area requested by the patient (or if unable, by the patient's family).  09/28/2014  Patient/family informed of their freedom to choose among providers that offer the needed level of care, that participate in Medicare, Medicaid or managed care program needed by the patient, have an available bed and are willing to accept the patient.  09/28/2014  Patient/family informed of [facility]' ownership interest in [facility], as well as of the fact that they are under no obligation to receive care at this facility.  PASARR submitted to EDS on 09/28/2014 PASARR number received on 09/28/2014  FL2 transmitted to all facilities in geographic area requested by pt/family on  09/28/2014 FL2 transmitted to all facilities within larger geographic area on   Patient informed that his/her managed care company has contracts with or will negotiate with  certain facilities, including the following:     Patient/family informed of bed offers received:  09/29/2014 [facility] Physician [facility] and patient [facility]  Patient to be [facility] on  09/29/2014 Patient to be transferred to facility by RN Patient and family notified of transfer on 09/29/2014 Name of family member notified:  Harrold Donathathan- son  The following physician request were entered  in Epic:   Additional Comments:  Derenda FennelKara [place]ks, [place] [phone]

## 2014-09-29 NOTE — Discharge Summary (Signed)
Physician Discharge Summary  Tina Lane ZOX:[id number] DOB: 1912-09-23 DOA: 09/27/2014  PCP: [name], MD  Admit date: 09/27/2014 Discharge date: 09/29/2014  Time spent: 40 minutes  Recommendations for Outpatient Follow-up:  1. Follow up with Dr Romeo Apple 6 weeks for evaluation of pelvic fracture, mobility and pain management 2. To [facility]. Recommend daily PT with weight bearing as tolerated with walker  Discharge Diagnoses:  Principal Problem:   Pelvic fracture Active Problems:   Hypothyroidism   GERD (gastroesophageal reflux disease)   Chronic venous stasis dermatitis of both lower extremities   Pelvic rim fracture   Discharge Condition: stable  Diet recommendation: regular  Filed Weights   09/28/14 0044  Weight: 54.885 kg (121 lb)    History of present illness:  A delightful 80 year old woman who has a past medical history of Glaucoma; Thyroid disease; GERD (gastroesophageal reflux disease); and HOH (hard of hearing) presented to ED on 09/28/14 after falling at home and was unable to get up. Patient has hearing disability, so communication very difficult.  History of from the patient's son. As per patient's son she lives by herself and has been very actively doing her ADLs. When she was taking out the wet towels from the basement she tripped on the doorstep and fell.  Patient lay on the floor for 4 hours and she was unable to get up.  In the ED x-ray of the hip and pelvis did not show fracture but the CT of the right hip showed nondisplaced fracture of the right superior and inferior pubic rami.  Patient denied any other symptoms no nausea vomiting or diarrhea. Did not pass out. No chest pain or shortness of breath. No fever dysuria urgency frequency of urination.[facility] Hospital Course:  Pelvic fracture  Related to mechanical fall at home. Evaluated by ortho who reviewed xrays and CT of right hip opine nondisplaced fractures of the right superior and inferior pubic rami.  No surgery required. Recommending 6 weeks WBAT with walker. Evaluated by PT who recommend snf. Follow up with Dr Romeo Apple 6 weeks. Pain management with tylenol or low dose Lortab.   Hypothyroidism  TSH 0.88. Continue Synthroid  Venous stasis dermatitis  Chronic. Remained stable at baseline  HOH  Severe. Difficult to communicate. She can see and read notes. No hearing aids per son.  GERD  Stable at discharge   Procedures:  none  Consultations:  Dr Romeo Apple orthopedics  Discharge Exam: Filed Vitals:   09/29/14 0730  BP: 131/57  Pulse: 83  Temp: 98.5 F (36.9 C)  Resp: 16    General: somewhat thin and frail appearing Cardiovascular: RRR No m/g/r. No LE edema. Mild chronic venous stasis changes bilaterally to LE.  Respiratory: normal effort BS clear bilaterally no wheez MS: right hip with decreased ROM due to pain. Non tender to touch. Foot warm  Discharge Instructions You were cared for by a hospitalist during your hospital stay. If you have any questions about your discharge medications or the care you received while you were in the hospital after you are discharged, you can call the unit and asked to speak with the hospitalist on call if the hospitalist that took care of you is not available. Once you are discharged, your primary care physician will handle any further medical issues. Please note that NO REFILLS for any discharge medications will be authorized once you are discharged, as it is imperative that you return to your primary care physician (or establish a relationship with a primary care physician  if you do not have one) for your aftercare needs so that they can reassess your need for medications and monitor your lab values.  Discharge Instructions   Diet - low sodium heart healthy    Complete by:  As directed      Discharge instructions    Complete by:  As directed   Weight bearing as tolerated with walker Follow up with Dr Romeo Apple 6 weeks     Increase activity  slowly    Complete by:  As directed           Current Discharge Medication List    START taking these medications   Details  acetaminophen (TYLENOL) 325 MG tablet Take 2 tablets (650 mg total) by mouth every 6 (six) hours as needed for mild pain.    HYDROcodone-acetaminophen (NORCO/VICODIN) 5-325 MG per tablet Take 1 tablet by mouth every 4 (four) hours as needed for moderate pain. Qty: 30 tablet, Refills: 0      CONTINUE these medications which have NOT CHANGED   Details  calcium carbonate (TUMS EX) 750 MG chewable tablet Chew 1 tablet by mouth at bedtime.     Calcium Carbonate-Vitamin D (CALCIUM 600 + D PO) Take 1 tablet by mouth every morning.     Cholecalciferol (VITAMIN D3) 1000 UNITS CAPS Take 1 capsule by mouth every morning.     levothyroxine (SYNTHROID, LEVOTHROID) 75 MCG tablet Take 75 mcg by mouth daily before breakfast.    polycarbophil (FIBERCON) 625 MG tablet Take 625 mg by mouth at bedtime.    timolol (BETIMOL) 0.5 % ophthalmic solution Place 1 drop into both eyes at bedtime.      aspirin EC 81 MG tablet Take 81 mg by mouth as needed (chest pain).       Allergies  Allergen Reactions  . Latex Itching  . Onion Nausea And Vomiting  . Other     SPANDEX ALLERGY  . Penicillins Other (See Comments)    UNKNOWN REACTION  . Sulfa Antibiotics Other (See Comments)    UNKNOWN REACTION   Follow-up Information   Follow up with [name],[name], MD. (As needed)    Specialty:  Family Medicine   Contact information:   [address] [place] [address] [phone]       Follow up with Fuller Canada, MD In 6 weeks. (follow up on pelvic fracture.)    Specialties:  Orthopedic Surgery, Radiology   Contact information:   [address] [place] [facility] [address] [phone]        The results of significant diagnostics from this hospitalization (including imaging, microbiology, ancillary and laboratory) are listed below for reference.     Significant Diagnostic Studies: Dg Chest 1 View  09/27/2014   CLINICAL DATA:  Fall.  EXAM: CHEST - 1 VIEW  COMPARISON:  July 16, 2014.  FINDINGS: The heart size and mediastinal contours are within normal limits. Both lungs are clear. No pneumothorax or pleural effusion is noted. The visualized skeletal structures are unremarkable.  IMPRESSION: No acute cardiopulmonary abnormality seen.   Electronically Signed   By: Roque Lias M.D.   On: 09/27/2014 19:35   Dg Hip Bilateral W/pelvis  09/27/2014   CLINICAL DATA:  Right hip pain after falling.  EXAM: BILATERAL HIP WITH PELVIS - 4+ VIEW  COMPARISON:  Pelvic CT 08/07/2011.  FINDINGS: The bones are demineralized. There is no evidence of acute fracture or dislocation. There are stable mild degenerative changes of both hips and sacroiliac joints. There  are stable calcifications adjacent to both greater trochanters. Increased central density in the pelvis suggests a distended bladder.  IMPRESSION: No acute osseous findings demonstrated at either hip. If the patient has persistent hip pain or inability to bear weight, follow up imaging may be warranted as hip fractures can be radiographically occult in the elderly.   Electronically Signed   By: Roxy HorsemanBill  Veazey M.D.   On: 09/27/2014 19:35   Ct Head Wo Contrast  09/27/2014   CLINICAL DATA:  Fall, found on floor  EXAM: CT HEAD WITHOUT CONTRAST  CT CERVICAL SPINE WITHOUT CONTRAST  TECHNIQUE: Multidetector CT imaging of the head and cervical spine was performed following the standard protocol without intravenous contrast. Multiplanar CT image reconstructions of the cervical spine were also generated.  COMPARISON:  None.  FINDINGS: CT HEAD FINDINGS  Motion degraded images.  No evidence of parenchymal hemorrhage or extra-axial fluid collection. No mass lesion, mass effect, or midline shift.  No CT evidence of acute infarction.  Subcortical white matter and periventricular small vessel ischemic changes. Intracranial  atherosclerosis.  Age related atrophy.  No ventriculomegaly.  The visualized paranasal sinuses are essentially clear. The mastoid air cells are unopacified.  No evidence of calvarial fracture.  CT CERVICAL SPINE FINDINGS  Motion degraded images.  Exaggerated cervical lordosis.  No evidence of fracture or dislocation. Vertebral body heights are maintained. Dens appears intact.  No prevertebral soft tissue swelling.  Mild to moderate multilevel degenerative changes.  Visualize lungs are notable for biapical pleural parenchymal scarring.  IMPRESSION: No evidence of acute intracranial abnormality. Atrophy with small vessel ischemic changes and intracranial atherosclerosis.  No evidence of traumatic injury to the cervical spine. Mild to moderate degenerative changes.   Electronically Signed   By: Charline BillsSriyesh  Krishnan M.D.   On: 09/27/2014 19:15   Ct Cervical Spine Wo Contrast  09/27/2014   CLINICAL DATA:  Fall, found on floor  EXAM: CT HEAD WITHOUT CONTRAST  CT CERVICAL SPINE WITHOUT CONTRAST  TECHNIQUE: Multidetector CT imaging of the head and cervical spine was performed following the standard protocol without intravenous contrast. Multiplanar CT image reconstructions of the cervical spine were also generated.  COMPARISON:  None.  FINDINGS: CT HEAD FINDINGS  Motion degraded images.  No evidence of parenchymal hemorrhage or extra-axial fluid collection. No mass lesion, mass effect, or midline shift.  No CT evidence of acute infarction.  Subcortical white matter and periventricular small vessel ischemic changes. Intracranial atherosclerosis.  Age related atrophy.  No ventriculomegaly.  The visualized paranasal sinuses are essentially clear. The mastoid air cells are unopacified.  No evidence of calvarial fracture.  CT CERVICAL SPINE FINDINGS  Motion degraded images.  Exaggerated cervical lordosis.  No evidence of fracture or dislocation. Vertebral body heights are maintained. Dens appears intact.  No prevertebral soft  tissue swelling.  Mild to moderate multilevel degenerative changes.  Visualize lungs are notable for biapical pleural parenchymal scarring.  IMPRESSION: No evidence of acute intracranial abnormality. Atrophy with small vessel ischemic changes and intracranial atherosclerosis.  No evidence of traumatic injury to the cervical spine. Mild to moderate degenerative changes.   Electronically Signed   By: Charline BillsSriyesh  Krishnan M.D.   On: 09/27/2014 19:15   Ct Hip Right Wo Contrast  09/27/2014   CLINICAL DATA:  Severe right hip pain after a fall. No acute abnormality on plain radiographs.  EXAM: CT OF THE RIGHT HIP WITHOUT CONTRAST  TECHNIQUE: Multidetector CT imaging of the right hip was performed according to the standard protocol. Multiplanar  CT image reconstructions were also generated.  COMPARISON:  Right hip radiographs 09/27/2014  FINDINGS: There are acute appearing nondisplaced fractures of the right superior and inferior pubic rami. Diffuse bone demineralization. The right hip appears intact. No fractures demonstrated in the proximal femur or acetabular region. No dislocation. Prominent degenerative changes in the right hip with osteophytes on the femoral and acetabular sides. Soft tissue calcification adjacent to the greater trochanter consistent with dystrophic calcification or synovial osteochondromatosis. Soft tissues appear intact. No significant hematoma. Vascular calcifications.  IMPRESSION: Nondisplaced fractures of the right superior and inferior pubic rami. Degenerative changes in the right hip.   Electronically Signed   By: Burman Nieves M.D.   On: 09/27/2014 22:58    Microbiology: No results found for this or any previous visit (from the past 240 hour(s)).   Labs: Basic Metabolic Panel:  Recent Labs Lab 09/27/14 2000 09/28/14 0614  NA 135* 137  K 4.0 4.0  CL 96 99  CO2 26 29  GLUCOSE 136* 98  BUN 13 14  CREATININE 0.84 0.84  CALCIUM 9.9 8.8   Liver Function Tests:  Recent  Labs Lab 09/28/14 0614  AST 20  ALT 14  ALKPHOS 48  BILITOT 0.9  PROT 6.6  ALBUMIN 3.3*   No results found for this basename: LIPASE, AMYLASE,  in the last 168 hours No results found for this basename: AMMONIA,  in the last 168 hours CBC:  Recent Labs Lab 09/27/14 2000 09/28/14 0614  WBC 9.8 7.3  NEUTROABS 8.4*  --   HGB 13.0 11.2*  HCT 38.4 33.8*  MCV 92.3 91.8  PLT 246 234   Cardiac Enzymes:  Recent Labs Lab 09/27/14 2000  CKTOTAL 146   BNP: BNP (last 3 results) No results found for this basename: PROBNP,  in the last 8760 hours CBG: No results found for this basename: GLUCAP,  in the last 168 hours     Signed:  Gwenyth Bender  [facility] 09/29/2014, 12:53 PM

## 2014-09-30 ENCOUNTER — Other Ambulatory Visit: Payer: Self-pay | Admitting: *Deleted

## 2014-09-30 MED ORDER — HYDROCODONE-ACETAMINOPHEN 5-325 MG PO TABS: 1.0000 | ORAL_TABLET | ORAL | Status: DC | PRN

## 2014-09-30 NOTE — Telephone Encounter (Signed)
[facility] 

## 2014-10-03 ENCOUNTER — Non-Acute Institutional Stay (SKILLED_NURSING_FACILITY): Payer: Self-pay | Admitting: Internal Medicine

## 2014-10-03 DIAGNOSIS — S32501D Unspecified fracture of right pubis, subsequent encounter for fracture with routine healing: Secondary | ICD-10-CM

## 2014-10-03 DIAGNOSIS — E039 Hypothyroidism, unspecified: Secondary | ICD-10-CM

## 2014-10-03 DIAGNOSIS — S32591D Other specified fracture of right pubis, subsequent encounter for fracture with routine healing: Secondary | ICD-10-CM

## 2014-10-03 DIAGNOSIS — K219 Gastro-esophageal reflux disease without esophagitis: Secondary | ICD-10-CM

## 2014-10-04 ENCOUNTER — Encounter: Payer: Self-pay | Admitting: *Deleted

## 2014-10-04 NOTE — Progress Notes (Addendum)
Patient ID: Tina Lane, female   DOB: 1912/07/18, 80 y.o.   MRN: [id number]               HISTORY & PHYSICAL  DATE:  10/03/2014    FACILITY: [facility]    LEVEL OF CARE:   SNF   CHIEF COMPLAINT:  Admission to SNF, post stay at [facility], 09/27/2014 through 09/29/2014.    HISTORY OF PRESENT ILLNESS:  This is a 80 year-old woman who lives in [place] in her own home with some support from her son.  She tells me she stubbed her toe and fell.  She was admitted to hospital when she could no longer mobilize after spending four hours on the floor.  An x-ray of the hip and pelvis did not show a fracture, but a CT scan of the right hip showed nondisplaced fractures of the right superior and inferior pubic rami.  She was seen by Orthopedics, who recommended weightbearing as tolerated with a walker.  She is here for an attempt at rehabilitation.    PAST MEDICAL HISTORY/PROBLEM LIST:  Includes:    Pelvic fractures as described.    Hypothyroidism.    Gastroesophageal reflux.    Chronic venous stasis and varicose veins of both lower extremities without current open wounds.    CURRENT MEDICATIONS:  Discharge medications include:    Tylenol 650 q.6 p.r.n.      Norco 5/325, 1 tablet every 4 hours p.r.n.    Tums 750 at bedtime.    Os-Cal plus D 600, 1 tablet in the morning.    Vitamin D3, 1000 U in the morning.    Synthroid 75 q.d.    Fibercon 625 at bedtime.    Timolol 1 drop into both eyes at bedtime.    ASA 81 q.d.     SOCIAL HISTORY:   HOUSING:  As mentioned, the patient states that she lives in her own home in [place].   FUNCTIONAL STATUS:  Her exact functional status is unclear, although she tells me she had help from a son.  I do not know whether she has a history of falls and I do not know whether she was using an ambulatory assist device.    FAMILY HISTORY:  Unclear.    REVIEW OF SYSTEMS:   GENERAL:  The patient does not look to be in any distress.  She  has profound bilateral hearing loss that makes communication very difficult.    PHYSICAL EXAMINATION:   VITAL SIGNS:   O2 SATURATIONS:   95%.   RESPIRATIONS:  18 and unlabored.   PULSE:  67 and regular.   GENERAL APPEARANCE:  The patient is not in any distress.   HEENT:   MOUTH/THROAT:   Oral exam is normal.    CHEST/RESPIRATORY:  Clear air entry bilaterally.    CARDIOVASCULAR:  CARDIAC:   Heart sounds are normal.  She appears to be euvolemic.   GASTROINTESTINAL:  ABDOMEN:   Slightly distended.  Bowel sounds are positive.   LIVER/SPLEEN/KIDNEYS:  No liver, no spleen.   GENITOURINARY:  BLADDER:   No evidence of bladder distention.  No evidence of costovertebral angle tenderness.   CIRCULATION:   EDEMA/VARICOSITIES:  Extremities:  There is venous stasis, but no open wounds are seen.   The edema is minimal.   PSYCHIATRIC:   MENTAL STATUS:   Through yelling at this lady,  I think she appears to be mostly cognitively intact.  Any dementia she has is mild.  ASSESSMENT/PLAN:  Fractures of her right inferior and superior pubic rami.  She is very reluctant to move this leg.  Hopefully, she will work well enough with physical therapy to regain some form of independent ambulation.  As mentioned, her functional status at home is unclear.    Hypothyroidism.  On replacement.     Obviously osteoporosis.    Gastroesophageal reflux disease listed on her problem list.  However, she is not on anything currently for this.    This will be a difficult effort for this very elderly woman.  I suspect she has enough cognition to work with therapy.  Social evaluation of her premorbid functional status will be useful.

## 2014-10-26 ENCOUNTER — Encounter: Payer: Self-pay | Admitting: *Deleted

## 2014-11-15 ENCOUNTER — Encounter: Payer: Self-pay | Admitting: Orthopedic Surgery

## 2014-11-15 ENCOUNTER — Ambulatory Visit (INDEPENDENT_AMBULATORY_CARE_PROVIDER_SITE_OTHER): Payer: Self-pay | Admitting: Orthopedic Surgery

## 2014-11-15 VITALS — Ht 60.0 in | Wt 121.0 lb

## 2014-11-15 DIAGNOSIS — S329XXD Fracture of unspecified parts of lumbosacral spine and pelvis, subsequent encounter for fracture with routine healing: Secondary | ICD-10-CM

## 2014-11-15 NOTE — Progress Notes (Signed)
Patient ID: Tina Lane, female   DOB: [birth year], [age] y.o.   MRN: [id number] Chief Complaint  Patient presents with  . Follow-up    hopital follow up, nondisplaced fractures of the right superior and inferior pubic rami, DOI 09/27/14    October 10 x-ray shows fracture healing appropriately  Patient has no pain over the right superior and inferior pubic ramus area with normal range of motion in her right hip  She is ambulating independently  Follow-up as needed

## 2014-11-29 ENCOUNTER — Non-Acute Institutional Stay (SKILLED_NURSING_FACILITY): Payer: 59 | Admitting: Internal Medicine

## 2014-11-29 ENCOUNTER — Encounter: Payer: Self-pay | Admitting: Internal Medicine

## 2014-11-29 DIAGNOSIS — S329XXD Fracture of unspecified parts of lumbosacral spine and pelvis, subsequent encounter for fracture with routine healing: Secondary | ICD-10-CM

## 2014-11-29 DIAGNOSIS — M810 Age-related osteoporosis without current pathological fracture: Secondary | ICD-10-CM

## 2014-11-29 DIAGNOSIS — E038 Other specified hypothyroidism: Secondary | ICD-10-CM

## 2014-11-29 DIAGNOSIS — H409 Unspecified glaucoma: Secondary | ICD-10-CM

## 2014-11-29 NOTE — Progress Notes (Signed)
Patient ID: Tina Lane, female   DOB: 07-27-1912, [age] y.o.   MRN: [id number] Patient ID: Tina Lane, female   DOB: 07-27-1912, [age] y.o.   MRN: [id number]             This is a discharge note    FACILITY: [facility]    LEVEL OF CARE:   SNF   CHIEF COMPLAINT:  Discharge note.    HISTORY OF PRESENT ILLNESS:  This is a 80 year-old woman who lives in [place] in her own home with some support from her son.  she stubbed her toe and fell.  She was admitted to hospital when she could no longer mobilize after spending four hours on the floor.  An x-ray of the hip and pelvis did not show a fracture, but a CT scan of the right hip showed nondisplaced fractures of the right superior and inferior pubic rami.  She was seen by Orthopedics, who recommended weightbearing as tolerated with a walker.  She is here for an attempt at rehabilitation--and per nursing has done quite well-she does not complain of any pain with the pelvis or hip when standing-which she demonstrated to me  Apparently she will be home with her son-and has a rolling walker and wheelchair at home.  She will have 24-hour care.  She was treated about a month ago for urinary tract infection she does not complain of any dysuria today this appears to have resolved unremarkably--she has been afebrile    PAST MEDICAL HISTORY/PROBLEM LIST:  Includes:    Pelvic fractures as described.    Hypothyroidism.    Gastroesophageal reflux.    Chronic venous stasis and varicose veins of both lower extremities without current open wounds.    CURRENT MEDICATIONS:  Discharge medications include:    Tylenol 650 q.6 p.r.n.      Norco 5/325, 1 tablet every 4 hours p.r.n.    Tums 750 at bedtime.    Os-Cal plus D 600, 1 tablet in the morning.    Vitamin D3, 1000 U in the morning.    Synthroid 75 q.d.    Fibercon 625 at bedtime.    Timolol 1 drop into both eyes at bedtime.    ASA 81 q.d.     SOCIAL HISTORY:   HOUSING:  As  mentioned, the patient states that she lives in her own home in [place] but her son will be living with her is my understanding.        FAMILY HISTORY:  Unclear.    REVIEW OF SYSTEMS:   GENERAL:   Does not have any overt complaints says she has some arthritis pain but this is not new.  Skin does not complain of any rashes or itching.  Head ears eyes nose mouth and throat-she is quite hard of hearing but denies any visual changes.  Respiratory no complaints of shortness of breath or cough.  Cardiac no chest pain or significant lower extremity edema other than venous stasis.  GI no complaints of abdominal discomfort nausea vomiting diarrhea or constipation.  Musculoskeletal says she does not really have any pelvic or hip pain has some generalized back pain from arthritis but does not really take her Norco very often.  Neurologic does not complain of any dizziness headache or syncopal-type feelings.  Psych appears cognitively intact and upbeat.    PHYSICAL EXAMINATION:    Temperature 98.0 pulse 87 respirations 20 blood pressure 115/72 weight is 124.6 this appears to be relatively stable.  GENERAL APPEARANCE:  The patient is not in any distress--sitting comfortably in her wheelchair.   HEENT:   MOUTH/THROAT:   Oral exam is normal.    CHEST/RESPIRATORY:  Clear air entry bilaterally.    CARDIOVASCULAR:  CARDIAC:   Heart sounds are normal.  She appears to be euvolemic.   GASTROINTESTINAL:  ABDOMEN:   Slightly distended.  Bowel sounds are positive. There is no tenderness   L.   Muscle skeletal-is able to stand but is quite weak does not complain of any pain-moves extremities at baseline again she appears to have recovered quite well from the pelvic fracture-I do not note any deformities.   Marland Kitchen.   CIRCULATION:   EDEMA/VARICOSITIES:  Extremities:  There is venous stasis, but no open wounds are seen.   The edema is minimal  Neurologic is grossly intact no lateralizing findings her  speech is clear .   PSYCHIATRIC:   MENTAL STATUS: Very pleasant appropriate and appears cognitively intact  Labs.  10/08/2014.  Sodium 137 potassium 4 BUN 22 creatinine 0.83.  10/08/2014.  WBC 6.9 hemoglobin 12.1 platelets 282.  Sodium 132 potassium 3.8 BUN 16 creatinine 0.82.  Liver function tests within normal limits.  TSH-2.269      ASSESSMENT/PLAN:  Fractures of her right inferior and superior pubic rami. She has progressed well with therapy is not complaining of pain appears to be mobilizing okay will need a rolling walker and wheelchair for ambulation certainly weakness with any attempt at self ambulation  .    Hypothyroidism.  On replacement.     Obviously osteoporosis. He is on calcium with vitamin D    Gastroesophageal reflux disease listed on her problem list.  However, she is not on anything currently for this. And this has been asymptomatic  History of glaucoma-she is on Timolol     encouraged by how well she has done status post rehabilitation here-certainly will need help with ambulation with a wheelchair rolling walker-she will be with her son which is reassuring-will need continued PT and OT for strengthening-she does have Norco as needed for pain but apparently does not take this very often.  [id number]-of note more than 30 minutes spent preparing this discharge summary

## 2015-01-05 ENCOUNTER — Encounter (HOSPITAL_COMMUNITY): Payer: Self-pay | Admitting: *Deleted

## 2015-01-05 ENCOUNTER — Emergency Department (HOSPITAL_COMMUNITY)
Admission: EM | Admit: 2015-01-05 | Discharge: 2015-01-05 | Disposition: A | Discharge status: Home / Self Care | Payer: 59 | Attending: Emergency Medicine | Admitting: Emergency Medicine

## 2015-01-05 ENCOUNTER — Emergency Department (HOSPITAL_COMMUNITY): Payer: 59

## 2015-01-05 DIAGNOSIS — M546 Pain in thoracic spine: Secondary | ICD-10-CM

## 2015-01-05 DIAGNOSIS — H409 Unspecified glaucoma: Secondary | ICD-10-CM | POA: Insufficient documentation

## 2015-01-05 DIAGNOSIS — Z91040 Latex allergy status: Secondary | ICD-10-CM | POA: Insufficient documentation

## 2015-01-05 DIAGNOSIS — Z791 Long term (current) use of non-steroidal anti-inflammatories (NSAID): Secondary | ICD-10-CM | POA: Diagnosis not present

## 2015-01-05 DIAGNOSIS — Z87828 Personal history of other (healed) physical injury and trauma: Secondary | ICD-10-CM | POA: Insufficient documentation

## 2015-01-05 DIAGNOSIS — R52 Pain, unspecified: Secondary | ICD-10-CM

## 2015-01-05 DIAGNOSIS — Z880 Allergy status to penicillin: Secondary | ICD-10-CM | POA: Diagnosis not present

## 2015-01-05 DIAGNOSIS — E079 Disorder of thyroid, unspecified: Secondary | ICD-10-CM | POA: Diagnosis not present

## 2015-01-05 DIAGNOSIS — Z8719 Personal history of other diseases of the digestive system: Secondary | ICD-10-CM | POA: Diagnosis not present

## 2015-01-05 DIAGNOSIS — M549 Dorsalgia, unspecified: Secondary | ICD-10-CM | POA: Diagnosis present

## 2015-01-05 DIAGNOSIS — Z79899 Other long term (current) drug therapy: Secondary | ICD-10-CM | POA: Insufficient documentation

## 2015-01-05 HISTORY — DX: Dorsalgia, unspecified: M54.9

## 2015-01-05 LAB — COMPREHENSIVE METABOLIC PANEL
ALT: 20 U/L (ref 0–35)
AST: 24 U/L (ref 0–37)
Albumin: 4.2 g/dL (ref 3.5–5.2)
Alkaline Phosphatase: 57 U/L (ref 39–117)
Anion gap: 5 (ref 5–15)
BILIRUBIN TOTAL: 0.5 mg/dL (ref 0.3–1.2)
BUN: 18 mg/dL (ref 6–23)
CHLORIDE: 102 meq/L (ref 96–112)
CO2: 30 mmol/L (ref 19–32)
CREATININE: 0.91 mg/dL (ref 0.50–1.10)
Calcium: 9.5 mg/dL (ref 8.4–10.5)
GFR calc Af Amer: 58 mL/min — ABNORMAL LOW (ref >90)
GFR, EST NON AFRICAN AMERICAN: 50 mL/min — AB (ref >90)
Glucose, Bld: 97 mg/dL (ref 70–99)
Potassium: 5.2 mmol/L — ABNORMAL HIGH (ref 3.5–5.1)
Sodium: 137 mmol/L (ref 135–145)
Total Protein: 7.6 g/dL (ref 6.0–8.3)

## 2015-01-05 LAB — URINALYSIS, ROUTINE W REFLEX MICROSCOPIC
Bilirubin Urine: NEGATIVE
GLUCOSE, UA: NEGATIVE mg/dL
HGB URINE DIPSTICK: NEGATIVE
Ketones, ur: NEGATIVE mg/dL
NITRITE: NEGATIVE
PH: 7 (ref 5.0–8.0)
Protein, ur: NEGATIVE mg/dL
Specific Gravity, Urine: 1.015 (ref 1.005–1.030)
Urobilinogen, UA: 0.2 mg/dL (ref 0.0–1.0)

## 2015-01-05 LAB — CBC WITH DIFFERENTIAL/PLATELET
Basophils Absolute: 0.1 10*3/uL (ref 0.0–0.1)
Basophils Relative: 1 % (ref 0–1)
EOS PCT: 2 % (ref 0–5)
Eosinophils Absolute: 0.1 10*3/uL (ref 0.0–0.7)
HCT: 39 % (ref 36.0–46.0)
Hemoglobin: 12.5 g/dL (ref 12.0–15.0)
Lymphocytes Relative: 19 % (ref 12–46)
Lymphs Abs: 1.4 10*3/uL (ref 0.7–4.0)
MCH: 30.3 pg (ref 26.0–34.0)
MCHC: 32.1 g/dL (ref 30.0–36.0)
MCV: 94.4 fL (ref 78.0–100.0)
Monocytes Absolute: 0.7 10*3/uL (ref 0.1–1.0)
Monocytes Relative: 10 % (ref 3–12)
NEUTROS ABS: 5 10*3/uL (ref 1.7–7.7)
NEUTROS PCT: 68 % (ref 43–77)
PLATELETS: 282 10*3/uL (ref 150–400)
RBC: 4.13 MIL/uL (ref 3.87–5.11)
RDW: 15 % (ref 11.5–15.5)
WBC: 7.2 10*3/uL (ref 4.0–10.5)

## 2015-01-05 LAB — URINE MICROSCOPIC-ADD ON

## 2015-01-05 MED ORDER — FENTANYL CITRATE 0.05 MG/ML IJ SOLN
50.0000 ug | Freq: Once | INTRAMUSCULAR | Status: AC
  Administered 2015-01-05: 50 ug via INTRAVENOUS
  Filled 2015-01-05: qty 2

## 2015-01-05 MED ORDER — ONDANSETRON HCL 4 MG/2ML IJ SOLN
4.0000 mg | Freq: Once | INTRAMUSCULAR | Status: AC
  Administered 2015-01-05: 4 mg via INTRAVENOUS
  Filled 2015-01-05: qty 2

## 2015-01-05 MED ORDER — HYDROCODONE-ACETAMINOPHEN 5-325 MG PO TABS: 1.0000 | ORAL_TABLET | ORAL | Status: DC | PRN

## 2015-01-05 NOTE — ED Notes (Signed)
Fully alert and easily mobile! States she only has a small ache in her right side/back. D/c'd with her son in nad

## 2015-01-05 NOTE — Discharge Instructions (Signed)
Back Pain, Adult Low back pain is very common. About 1 in 5 people have back pain.The cause of low back pain is rarely dangerous. The pain often gets better over time.About half of people with a sudden onset of back pain feel better in just 2 weeks. About 8 in 10 people feel better by 6 weeks.  CAUSES Some common causes of back pain include:  Strain of the muscles or ligaments supporting the spine.  Wear and tear (degeneration) of the spinal discs.  Arthritis.  Direct injury to the back. DIAGNOSIS Most of the time, the direct cause of low back pain is not known.However, back pain can be treated effectively even when the exact cause of the pain is unknown.Answering your caregiver's questions about your overall health and symptoms is one of the most accurate ways to make sure the cause of your pain is not dangerous. If your caregiver needs more information, he or she may order lab work or imaging tests (X-rays or MRIs).However, even if imaging tests show changes in your back, this usually does not require surgery. HOME CARE INSTRUCTIONS For many people, back pain returns.Since low back pain is rarely dangerous, it is often a condition that people can learn to manageon their own.   Remain active. It is stressful on the back to sit or stand in one place. Do not sit, drive, or stand in one place for more than 30 minutes at a time. Take short walks on level surfaces as soon as pain allows.Try to increase the length of time you walk each day.  Do not stay in bed.Resting more than 1 or 2 days can delay your recovery.  Do not avoid exercise or work.Your body is made to move.It is not dangerous to be active, even though your back may hurt.Your back will likely heal faster if you return to being active before your pain is gone.  Pay attention to your body when you bend and lift. Many people have less discomfortwhen lifting if they bend their knees, keep the load close to their bodies,and  avoid twisting. Often, the most comfortable positions are those that put less stress on your recovering back.  Find a comfortable position to sleep. Use a firm mattress and lie on your side with your knees slightly bent. If you lie on your back, put a pillow under your knees.  Only take over-the-counter or prescription medicines as directed by your caregiver. Over-the-counter medicines to reduce pain and inflammation are often the most helpful.Your caregiver may prescribe muscle relaxant drugs.These medicines help dull your pain so you can more quickly return to your normal activities and healthy exercise.  Put ice on the injured area.  Put ice in a plastic bag.  Place a towel between your skin and the bag.  Leave the ice on for 15-20 minutes, 03-04 times a day for the first 2 to 3 days. After that, ice and heat may be alternated to reduce pain and spasms.  Ask your caregiver about trying back exercises and gentle massage. This may be of some benefit.  Avoid feeling anxious or stressed.Stress increases muscle tension and can worsen back pain.It is important to recognize when you are anxious or stressed and learn ways to manage it.Exercise is a great option. SEEK MEDICAL CARE IF:  You have pain that is not relieved with rest or medicine.  You have pain that does not improve in 1 week.  You have new symptoms.  You are generally not feeling well. SEEK   IMMEDIATE MEDICAL CARE IF:   You have pain that radiates from your back into your legs.  You develop new bowel or bladder control problems.  You have unusual weakness or numbness in your arms or legs.  You develop nausea or vomiting.  You develop abdominal pain.  You feel faint. Document Released: 12/16/2005 Document Revised: 06/16/2012 Document Reviewed: 04/19/2014 Exit[employer] Patient Information 2015 [employer]. This information is not intended to replace advice given to you by your health care provider. Make sure you  discuss any questions you have with your health care provider.  

## 2015-01-05 NOTE — ED Provider Notes (Signed)
CSN: [id number]     Arrival date & time 01/05/15  1409 History   First MD Initiated Contact with Patient 01/05/15 [phone]     Chief Complaint  Patient presents with  . Back Pain    Level V caveat due to difficulty hearing  (Consider location/radiation/quality/duration/timing/severity/associated sxs/prior Treatment) Patient is a 81 y.o. female presenting with back pain. The history is provided by the patient and a relative.  Back Pain  patient has has had back pain for last couple days. No fall. Has been able to manage at home. Is on no narcotics. No numbness or weakness. Hurts worse with movement. No fevers. She gets around with a walker at baseline. She was discharged from  Past Medical History  Diagnosis Date  . Glaucoma   . Thyroid disease   . GERD (gastroesophageal reflux disease)   . HOH (hard of hearing)   . Pelvic fracture     snf  . Back pain    Past Surgical History  Procedure Laterality Date  . Abdominal surgery     Family History  Problem Relation Age of Onset  . Cancer Sister     unknown  . Cancer Sister     unknown  . Coronary artery disease Father   . Cancer Mother     died age 51   History  Substance Use Topics  . Smoking status: Never Smoker   . Smokeless tobacco: Never Used  . Alcohol Use: No   OB History    No data available     Review of Systems  Unable to perform ROS Musculoskeletal: Positive for back pain.      Allergies  Latex; Onion; Other; Penicillins; and Sulfa antibiotics  Home Medications   Prior to Admission medications   Medication Sig Start Date End Date Taking? Authorizing Provider  acetaminophen (TYLENOL) 325 MG tablet Take 2 tablets (650 mg total) by mouth every 6 (six) hours as needed for mild pain. 09/29/14  Yes Lesle Chris Black, NP  calcium carbonate (TUMS EX) 750 MG chewable tablet Chew 1 tablet by mouth at bedtime.    Yes Historical Provider, MD  Calcium Carbonate-Vitamin D (CALCIUM 600 + D PO) Take 1 tablet by mouth every  morning.    Yes Historical Provider, MD  Cholecalciferol (VITAMIN D3) 1000 UNITS CAPS Take 1 capsule by mouth every morning.    Yes Historical Provider, MD  ibuprofen (ADVIL,MOTRIN) 200 MG tablet Take 600 mg by mouth 3 (three) times daily.   Yes Historical Provider, MD  levothyroxine (SYNTHROID, LEVOTHROID) 75 MCG tablet Take 75 mcg by mouth daily before breakfast.   Yes Historical Provider, MD  polycarbophil (FIBERCON) 625 MG tablet Take 625 mg by mouth at bedtime.   Yes Historical Provider, MD  timolol (BETIMOL) 0.5 % ophthalmic solution Place 1 drop into both eyes at bedtime.     Yes Historical Provider, MD  HYDROcodone-acetaminophen (NORCO/VICODIN) 5-325 MG per tablet Take 1 tablet by mouth every 4 (four) hours as needed for moderate pain. 01/05/15   Juliet Rude. Shawndell Schillaci, MD   BP 152/73 mmHg  Pulse 80  Temp(Src) 97.6 F (36.4 C) (Oral)  Resp 18  Ht  (1.575 m)  Wt 122 lb (55.339 kg)  BMI 22.31 kg/m2  SpO2 100% Physical Exam  Constitutional: She appears well-developed.  HENT:  Head: Normocephalic.  Neck: Neck supple.  Cardiovascular: Normal rate.   Pulmonary/Chest: Effort normal.  Abdominal:  Mild diffuse tenderness without rebound or guarding. Mild distention.  Musculoskeletal: She  exhibits tenderness.  Some tenderness over mid back bilaterally. No rash. No step-off or deformities.  Neurological: She is alert.  Patient is awake and appropriate but very  hard of hearing  Skin: Skin is warm.    ED Course  Procedures (including critical care time) Labs Review Labs Reviewed  URINALYSIS, ROUTINE W REFLEX MICROSCOPIC - Abnormal; Notable for the following:    Leukocytes, UA SMALL (*)    All other components within normal limits  COMPREHENSIVE METABOLIC PANEL - Abnormal; Notable for the following:    Potassium 5.2 (*)    GFR calc non Af Amer 50 (*)    GFR calc Af Amer 58 (*)    All other components within normal limits  URINE MICROSCOPIC-ADD ON - Abnormal; Notable for the  following:    Squamous Epithelial / LPF FEW (*)    All other components within normal limits  CBC WITH DIFFERENTIAL    Imaging Review Dg Thoracic Spine 2 View  01/05/2015   CLINICAL DATA:  Back pain.  EXAM: THORACIC SPINE - 2 VIEW  COMPARISON:  Chest x-ray dated 09/27/2014 and 07/16/2014  FINDINGS: There is no fracture. There is no bone destruction. There is marked accentuation of the thoracic kyphosis with diffuse osteopenia but the appearance is essentially unchanged since 07/16/2014. There is diffuse degenerative disc disease, also unchanged.  IMPRESSION: No acute abnormalities of the thoracic spine.   Electronically Signed   By: Geanie CooleyJim  Maxwell M.D.   On: 01/05/2015 17:02   Dg Lumbar Spine Complete  01/05/2015   CLINICAL DATA:  Back pain. Recent pelvic fracture in September 2015.  EXAM: LUMBAR SPINE - COMPLETE 4+ VIEW  COMPARISON:  Radiographs dated 08/07/2011 and CT scan of the abdomen and pelvis dated 08/07/2011  FINDINGS: There is diffuse osteopenia. There is an old compression fracture of the inferior endplate of L2. There is slight disc space narrowing at L3-4 and L4-5. There is no spondylolisthesis. There is a slight rotoscoliosis which is stable.  IMPRESSION: No acute abnormality of the lumbar spine.   Electronically Signed   By: Geanie CooleyJim  Maxwell M.D.   On: 01/05/2015 17:06     EKG Interpretation None      MDM   Final diagnoses:  Pain  Midline thoracic back pain    Patient with back pain. No clear injury. Has had history of the same. X-ray is reassuring. Patient feels better after treatments and states she wants to go home. Will discharge home with some pain med. Patient has been on hydrocodone the past and will restart it.    Juliet RudeNathan R. Rubin PayorPickering, MD 01/05/15 1910

## 2015-01-05 NOTE — ED Notes (Signed)
Pt c/o back pain, recent pelvic fx in Sept 2015 and completed rehab at [facility], had home health at home

## 2015-01-10 ENCOUNTER — Emergency Department (HOSPITAL_COMMUNITY): Payer: 59

## 2015-01-10 ENCOUNTER — Emergency Department (HOSPITAL_COMMUNITY)
Admission: EM | Admit: 2015-01-10 | Discharge: 2015-01-10 | Disposition: A | Discharge status: Home / Self Care | Payer: 59 | Attending: Emergency Medicine | Admitting: Emergency Medicine

## 2015-01-10 ENCOUNTER — Encounter (HOSPITAL_COMMUNITY): Payer: Self-pay | Admitting: Emergency Medicine

## 2015-01-10 DIAGNOSIS — H409 Unspecified glaucoma: Secondary | ICD-10-CM | POA: Insufficient documentation

## 2015-01-10 DIAGNOSIS — H9190 Unspecified hearing loss, unspecified ear: Secondary | ICD-10-CM | POA: Insufficient documentation

## 2015-01-10 DIAGNOSIS — Z880 Allergy status to penicillin: Secondary | ICD-10-CM | POA: Diagnosis not present

## 2015-01-10 DIAGNOSIS — Z79899 Other long term (current) drug therapy: Secondary | ICD-10-CM | POA: Insufficient documentation

## 2015-01-10 DIAGNOSIS — M545 Low back pain, unspecified: Secondary | ICD-10-CM

## 2015-01-10 DIAGNOSIS — K5900 Constipation, unspecified: Secondary | ICD-10-CM | POA: Insufficient documentation

## 2015-01-10 DIAGNOSIS — R1084 Generalized abdominal pain: Secondary | ICD-10-CM | POA: Diagnosis present

## 2015-01-10 DIAGNOSIS — Z91040 Latex allergy status: Secondary | ICD-10-CM | POA: Insufficient documentation

## 2015-01-10 DIAGNOSIS — Z8781 Personal history of (healed) traumatic fracture: Secondary | ICD-10-CM | POA: Insufficient documentation

## 2015-01-10 DIAGNOSIS — K219 Gastro-esophageal reflux disease without esophagitis: Secondary | ICD-10-CM | POA: Insufficient documentation

## 2015-01-10 DIAGNOSIS — R109 Unspecified abdominal pain: Secondary | ICD-10-CM

## 2015-01-10 DIAGNOSIS — Z791 Long term (current) use of non-steroidal anti-inflammatories (NSAID): Secondary | ICD-10-CM | POA: Insufficient documentation

## 2015-01-10 DIAGNOSIS — E079 Disorder of thyroid, unspecified: Secondary | ICD-10-CM | POA: Diagnosis not present

## 2015-01-10 LAB — URINALYSIS, ROUTINE W REFLEX MICROSCOPIC
Bilirubin Urine: NEGATIVE
GLUCOSE, UA: NEGATIVE mg/dL
HGB URINE DIPSTICK: NEGATIVE
Ketones, ur: NEGATIVE mg/dL
Leukocytes, UA: NEGATIVE
Nitrite: NEGATIVE
Protein, ur: NEGATIVE mg/dL
SPECIFIC GRAVITY, URINE: 1.01 (ref 1.005–1.030)
Urobilinogen, UA: 0.2 mg/dL (ref 0.0–1.0)
pH: 8 (ref 5.0–8.0)

## 2015-01-10 LAB — CBC WITH DIFFERENTIAL/PLATELET
BASOS ABS: 0 10*3/uL (ref 0.0–0.1)
BASOS PCT: 1 % (ref 0–1)
EOS PCT: 1 % (ref 0–5)
Eosinophils Absolute: 0.1 10*3/uL (ref 0.0–0.7)
HCT: 35.4 % — ABNORMAL LOW (ref 36.0–46.0)
Hemoglobin: 11.5 g/dL — ABNORMAL LOW (ref 12.0–15.0)
Lymphocytes Relative: 17 % (ref 12–46)
Lymphs Abs: 1.2 10*3/uL (ref 0.7–4.0)
MCH: 30.2 pg (ref 26.0–34.0)
MCHC: 32.5 g/dL (ref 30.0–36.0)
MCV: 92.9 fL (ref 78.0–100.0)
Monocytes Absolute: 0.8 10*3/uL (ref 0.1–1.0)
Monocytes Relative: 11 % (ref 3–12)
NEUTROS PCT: 70 % (ref 43–77)
Neutro Abs: 4.8 10*3/uL (ref 1.7–7.7)
PLATELETS: 291 10*3/uL (ref 150–400)
RBC: 3.81 MIL/uL — AB (ref 3.87–5.11)
RDW: 14.9 % (ref 11.5–15.5)
WBC: 6.8 10*3/uL (ref 4.0–10.5)

## 2015-01-10 LAB — COMPREHENSIVE METABOLIC PANEL
ALT: 14 U/L (ref 0–35)
AST: 17 U/L (ref 0–37)
Albumin: 3.7 g/dL (ref 3.5–5.2)
Alkaline Phosphatase: 48 U/L (ref 39–117)
Anion gap: 6 (ref 5–15)
BUN: 14 mg/dL (ref 6–23)
CALCIUM: 8.7 mg/dL (ref 8.4–10.5)
CO2: 28 mmol/L (ref 19–32)
Chloride: 104 mEq/L (ref 96–112)
Creatinine, Ser: 0.75 mg/dL (ref 0.50–1.10)
GFR calc non Af Amer: 67 mL/min — ABNORMAL LOW (ref >90)
GFR, EST AFRICAN AMERICAN: 78 mL/min — AB (ref >90)
Glucose, Bld: 95 mg/dL (ref 70–99)
Potassium: 3.9 mmol/L (ref 3.5–5.1)
SODIUM: 138 mmol/L (ref 135–145)
Total Bilirubin: 0.6 mg/dL (ref 0.3–1.2)
Total Protein: 6.7 g/dL (ref 6.0–8.3)

## 2015-01-10 LAB — LIPASE, BLOOD: Lipase: 25 U/L (ref 11–59)

## 2015-01-10 MED ORDER — MORPHINE SULFATE 2 MG/ML IJ SOLN
1.0000 mg | INTRAMUSCULAR | Status: DC | PRN
  Administered 2015-01-10: 1 mg via INTRAVENOUS
  Filled 2015-01-10: qty 1

## 2015-01-10 MED ORDER — IOHEXOL 300 MG/ML  SOLN: 100.0000 mL | Freq: Once | INTRAMUSCULAR | Status: DC | PRN

## 2015-01-10 MED ORDER — HYDROCODONE-ACETAMINOPHEN 5-325 MG PO TABS
1.0000 | ORAL_TABLET | Freq: Once | ORAL | Status: AC
  Administered 2015-01-10: 1 via ORAL
  Filled 2015-01-10: qty 1

## 2015-01-10 MED ORDER — SODIUM CHLORIDE 0.9 % IV SOLN
INTRAVENOUS | Status: DC
  Administered 2015-01-10: 09:00:00 via INTRAVENOUS

## 2015-01-10 NOTE — ED Notes (Addendum)
Patient's IV infiltrated, IV removed. Warm pack applied. Patient states it doesn't hurt.

## 2015-01-10 NOTE — ED Notes (Signed)
Pt tolerated ambulation well with steady gait. Complaining of back pain.

## 2015-01-10 NOTE — ED Notes (Signed)
Lab notified, pt. Is back from radiology for  redraw of labs.

## 2015-01-10 NOTE — ED Notes (Signed)
Patient brought back from CT to wait for creatinine to result. CT called lab and lab stated they need to redraw labwork.

## 2015-01-10 NOTE — ED Notes (Signed)
Patient was recently seen and treated for back pain. States she has been taking pain medication and hasn't had bowel movement in a week. Complains of continued back pain as well.

## 2015-01-10 NOTE — ED Notes (Signed)
Patient drinking CT contrast as IV can not be reestablished. CT to be done in one hour.

## 2015-01-10 NOTE — ED Provider Notes (Signed)
CSN: [id number]     Arrival date & time 01/10/15  [id number] History   First MD Initiated Contact with Patient 01/10/15 0710     Chief Complaint  Patient presents with  . Abdominal Pain  . Constipation  . Back Pain      HPI Pt was seen at 0710. Per pt and her family, c/o gradual onset and persistence of constant generalized abd "pain" for the past 1 week. Has been associated with constipation with last BM approximately 1 week ago. Pt took one dose of MOM yesterday without change in her symptoms. Pt also states she was evaluated in the ED 5 days ago for low back pain, rx norco. Pt states her back continues to "hurt" despite taking the medication as prescribed. Denies N/V/D, no CP/SOB, no black or blood in stools, no fevers, no rash, no focal motor weakness, no tingling/numbness in extremities, no falls, no saddle anesthesia.     Past Medical History  Diagnosis Date  . Glaucoma   . Thyroid disease   . GERD (gastroesophageal reflux disease)   . HOH (hard of hearing)   . Pelvic fracture     snf  . Back pain    Past Surgical History  Procedure Laterality Date  . Abdominal surgery     Family History  Problem Relation Age of Onset  . Cancer Sister     unknown  . Cancer Sister     unknown  . Coronary artery disease Father   . Cancer Mother     died age 79   History  Substance Use Topics  . Smoking status: Never Smoker   . Smokeless tobacco: Never Used  . Alcohol Use: No    Review of Systems ROS: Statement: All systems negative except as marked or noted in the HPI; Constitutional: Negative for fever and chills. ; ; Eyes: Negative for eye pain, redness and discharge. ; ; ENMT: Negative for ear pain, hoarseness, nasal congestion, sinus pressure and sore throat. ; ; Cardiovascular: Negative for chest pain, palpitations, diaphoresis, dyspnea and peripheral edema. ; ; Respiratory: Negative for cough, wheezing and stridor. ; ; Gastrointestinal: +abd pain, constipation. Negative for nausea,  vomiting, diarrhea, blood in stool, hematemesis, jaundice and rectal bleeding. . ; ; Genitourinary: Negative for dysuria, flank pain and hematuria. ; ; Musculoskeletal: +LBP. Negative for neck pain. Negative for swelling and trauma.; ; Skin: Negative for pruritus, rash, abrasions, blisters, bruising and skin lesion.; ; Neuro: Negative for headache, lightheadedness and neck stiffness. Negative for weakness, altered level of consciousness , altered mental status, extremity weakness, paresthesias, involuntary movement, seizure and syncope.      Allergies  Latex; Onion; Other; Penicillins; and Sulfa antibiotics  Home Medications   Prior to Admission medications   Medication Sig Start Date End Date Taking? Authorizing Provider  acetaminophen (TYLENOL) 325 MG tablet Take 2 tablets (650 mg total) by mouth every 6 (six) hours as needed for mild pain. 09/29/14   Gwenyth BenderKaren M Black, NP  calcium carbonate (TUMS EX) 750 MG chewable tablet Chew 1 tablet by mouth at bedtime.     Historical Provider, MD  Calcium Carbonate-Vitamin D (CALCIUM 600 + D PO) Take 1 tablet by mouth every morning.     Historical Provider, MD  Cholecalciferol (VITAMIN D3) 1000 UNITS CAPS Take 1 capsule by mouth every morning.     Historical Provider, MD  HYDROcodone-acetaminophen (NORCO/VICODIN) 5-325 MG per tablet Take 1 tablet by mouth every 4 (four) hours as needed for moderate pain. 01/05/15  Juliet Rude. Pickering, MD  ibuprofen (ADVIL,MOTRIN) 200 MG tablet Take 600 mg by mouth 3 (three) times daily.    Historical Provider, MD  levothyroxine (SYNTHROID, LEVOTHROID) 75 MCG tablet Take 75 mcg by mouth daily before breakfast.    Historical Provider, MD  polycarbophil (FIBERCON) 625 MG tablet Take 625 mg by mouth at bedtime.    Historical Provider, MD  timolol (BETIMOL) 0.5 % ophthalmic solution Place 1 drop into both eyes at bedtime.      Historical Provider, MD   BP 180/78 mmHg  Pulse 89  Temp(Src) 97.5 F (36.4 C) (Oral)  Resp 26  Ht 5'  2" (1.575 m)  Wt 122 lb (55.339 kg)  BMI 22.31 kg/m2  SpO2 99% Physical Exam  0715; Physical examination:  Nursing notes reviewed; Vital signs and O2 SAT reviewed;  Constitutional: Well developed, Well nourished, Well hydrated, In no acute distress; Head:  Normocephalic, atraumatic; Eyes: EOMI, PERRL, No scleral icterus; ENMT: Mouth and pharynx normal, Mucous membranes moist; Neck: Supple, Full range of motion, No lymphadenopathy; Cardiovascular: Regular rate and rhythm, No gallop; Respiratory: Breath sounds clear & equal bilaterally, No wheezes.  Speaking full sentences with ease, Normal respiratory effort/excursion; Chest: Nontender, Movement normal; Abdomen: Soft, +mild diffuse tenderness to palp. Decreased bowel sounds; Genitourinary: No CVA tenderness; Spine:  No midline CS, TS, LS tenderness. +thoracic kyphosis. +TTP right lumbar paraspinal muscles. No rash.;; Extremities: Pulses normal, Pelvis stable. No tenderness, No edema, No calf edema or asymmetry.; Neuro: AA&Ox3, +HOH, otherwise major CN grossly intact. Speech clear. Strength 5/5 equal bilat UE's and LE's, including great toe dorsiflexion.  DTR 2/4 equal bilat UE's and LE's.  No gross sensory deficits.  Neg straight leg raises bilat.; Skin: Color normal, Warm, Dry.   ED Course  Procedures     EKG Interpretation None      MDM  MDM Reviewed: previous chart, nursing note and vitals Reviewed previous: labs and x-ray Interpretation: labs, x-ray and CT scan   Results for orders placed or performed during the hospital encounter of 01/10/15  Comprehensive metabolic panel  Result Value Ref Range   Sodium 138 135 - 145 mmol/L   Potassium 3.9 3.5 - 5.1 mmol/L   Chloride 104 96 - 112 mEq/L   CO2 28 19 - 32 mmol/L   Glucose, Bld 95 70 - 99 mg/dL   BUN 14 6 - 23 mg/dL   Creatinine, Ser 1.61 0.50 - 1.10 mg/dL   Calcium 8.7 8.4 - 09.6 mg/dL   Total Protein 6.7 6.0 - 8.3 g/dL   Albumin 3.7 3.5 - 5.2 g/dL   AST 17 0 - 37 U/L   ALT  14 0 - 35 U/L   Alkaline Phosphatase 48 39 - 117 U/L   Total Bilirubin 0.6 0.3 - 1.2 mg/dL   GFR calc non Af Amer 67 (L) >90 mL/min   GFR calc Af Amer 78 (L) >90 mL/min   Anion gap 6 5 - 15  CBC with Differential  Result Value Ref Range   WBC 6.8 4.0 - 10.5 K/uL   RBC 3.81 (L) 3.87 - 5.11 MIL/uL   Hemoglobin 11.5 (L) 12.0 - 15.0 g/dL   HCT 04.5 (L) 40.9 - 81.1 %   MCV 92.9 78.0 - 100.0 fL   MCH 30.2 26.0 - 34.0 pg   MCHC 32.5 30.0 - 36.0 g/dL   RDW 91.4 78.2 - 95.6 %   Platelets 291 150 - 400 K/uL   Neutrophils Relative % 70 43 -  77 %   Neutro Abs 4.8 1.7 - 7.7 K/uL   Lymphocytes Relative 17 12 - 46 %   Lymphs Abs 1.2 0.7 - 4.0 K/uL   Monocytes Relative 11 3 - 12 %   Monocytes Absolute 0.8 0.1 - 1.0 K/uL   Eosinophils Relative 1 0 - 5 %   Eosinophils Absolute 0.1 0.0 - 0.7 K/uL   Basophils Relative 1 0 - 1 %   Basophils Absolute 0.0 0.0 - 0.1 K/uL  Lipase, blood  Result Value Ref Range   Lipase 25 11 - 59 U/L  Urinalysis, Routine w reflex microscopic  Result Value Ref Range   Color, Urine YELLOW YELLOW   APPearance CLEAR CLEAR   Specific Gravity, Urine 1.010 1.005 - 1.030   pH 8.0 5.0 - 8.0   Glucose, UA NEGATIVE NEGATIVE mg/dL   Hgb urine dipstick NEGATIVE NEGATIVE   Bilirubin Urine NEGATIVE NEGATIVE   Ketones, ur NEGATIVE NEGATIVE mg/dL   Protein, ur NEGATIVE NEGATIVE mg/dL   Urobilinogen, UA 0.2 0.0 - 1.0 mg/dL   Nitrite NEGATIVE NEGATIVE   Leukocytes, UA NEGATIVE NEGATIVE   Ct Abdomen Pelvis Wo Contrast 01/10/2015   CLINICAL DATA:  Abdominal pain.  No bowel movement for 1 week.  EXAM: CT ABDOMEN AND PELVIS WITHOUT CONTRAST  TECHNIQUE: Multidetector CT imaging of the abdomen and pelvis was performed following the standard protocol without IV contrast.  COMPARISON:  CT abdomen and pelvis 08/07/2011.  FINDINGS: There small bilateral pleural effusions and mild basilar atelectasis. Heart size is mildly enlarged. No pericardial effusion is noted.  Sigmoid diverticulosis  without diverticulitis is identified. A large volume of stool is seen in the ascending and transverse colon. The stomach and small bowel appear normal.  Mild fullness of the right renal collecting system is unchanged. The left kidney appears normal. The gallbladder, liver, spleen and adrenal glands are unremarkable. The pancreas appears normal. Aortoiliac atherosclerosis without aneurysm is identified. No lymphadenopathy or fluid collection is seen. Since the prior CT scan, since the prior exam, the patient has suffered a mild inferior endplate compression fracture T11. Remote inferior endplate compression fracture L2 is unchanged. No lytic or sclerotic bony lesion is identified.  IMPRESSION: No acute abnormality abdomen or pelvis.  Small bilateral pleural effusions.  Large volume of stool ascending and transverse colon.  Diverticulosis without diverticulitis.  Inferior endplate compression fracture of T11 is new since 2012 but age indeterminate.   Electronically Signed   By: Drusilla Kanner M.D.   On: 01/10/2015 12:08   Dg Chest Port 1 View 01/10/2015   CLINICAL DATA:  Abdominal pain  EXAM: PORTABLE CHEST - 1 VIEW  COMPARISON:  None.  FINDINGS: There is bilateral chronic interstitial lung disease. There is a small right pleural effusion. There is no focal parenchymal opacity, pleural effusion, or pneumothorax. There is stable cardiomegaly.  The osseous structures are unremarkable.  IMPRESSION: Stable cardiomegaly with a small right pleural effusion. Bilateral diffuse chronic interstitial lung disease. Mild superimposed pulmonary edema is not excluded.   Electronically Signed   By: Elige Ko   On: 01/10/2015 09:51    1445:  Pt feels better after pain meds and wants to go home now. Family would like to take pt home now. Pt has ambulated with her walker per her baseline steady gait, per family observing. Pt's VS remain stable. Will continue to tx symptomatically at this time. Family states pt has "enough"  pain meds at home and do not need another rx. Dx and  testing d/w pt and family.  Questions answered.  Verb understanding, agreeable to d/c home with outpt f/u.     Samuel Jester, DO 01/14/15 [phone]

## 2015-01-10 NOTE — Discharge Instructions (Signed)
°Emergency Department Resource Guide °1) Find a Doctor and Pay Out of Pocket °Although you won't have to find out who is covered by your insurance plan, it is a good idea to ask around and get recommendations. You will then need to call the office and see if the doctor you have chosen will accept you as a new patient and what types of options they offer for patients who are self-pay. Some doctors offer discounts or will set up payment plans for their patients who do not have insurance, but you will need to ask so you aren't surprised when you get to your appointment. ° °2) Contact Your Local Health Department °Not all health departments have doctors that can see patients for sick visits, but many do, so it is worth a call to see if yours does. If you don't know where your local health department is, you can check in your phone book. The CDC also has a tool to help you locate your state's health department, and many state websites also have listings of all of their local health departments. ° °3) Find a Walk-in Clinic °If your illness is not likely to be very severe or complicated, you may want to try a walk in clinic. These are popping up all over the country in pharmacies, drugstores, and shopping centers. They're usually staffed by nurse practitioners or physician assistants that have been trained to treat common illnesses and complaints. They're usually fairly quick and inexpensive. However, if you have serious medical issues or chronic medical problems, these are probably not your best option. ° °No Primary Care Doctor: °- Call [facility] at  832-8000 - they can help you locate a primary care doctor that  accepts your insurance, provides certain services, etc. °- Physician Referral Service- 1-800-533-3463 ° °Chronic Pain Problems: °Organization         Address  Phone   Notes  °[facility]  (336) 297-2271 Patients need to be referred by their primary care doctor.  ° °Medication  Assistance: °Organization         Address  Phone   Notes  °[place] [facility] [address] °[place], [place] [address] (336) 641-8030 --Must be a resident of [place] °-- Must have NO insurance coverage whatsoever (no Medicaid/ Medicare, etc.) °-- The pt. MUST have a primary care doctor that directs their care regularly and follows them in the community °  °[facility]  (866) 331-1348   °[employer]  (888) 892-1162   ° °Agencies that provide inexpensive medical care: °Organization         Address  Phone   Notes  °[facility]  (336) 832-8035   °[facility]    (336) 832-7272   °[facility] [address] °[place], [place] [address] (336) 832-4777   °[facility] [address], °[place] (336) 271-4999   °[facility]    (336) 373-0678   °[facility]    (336) 272-1050   °[facility] ° [address], [place] Phone:  (336) 832-4444, Fax:  (336) 832-4440 Hours of Operation:  9 am - 6 pm, M-F.  Also accepts Medicaid/Medicare and self-pay.  °[facility] ° [address], [place] Phone: (336) 832-3150, Fax: (336) [phone]. Hours of Operation:  8:30 am - 5:30 pm, M-F.  Also accepts Medicaid and self-pay.  °[facility] [place] [address], [place] Phone: (336) 878-6027   °[facility] [address], [place], [place] (336)723-1848, Ext. 123 Mondays & Thursdays: 7-9 AM.  First 15 patients are seen on a first come, first serve basis. °  ° °Medicaid-accepting [place] Providers: ° °Organization         Address  Phone   Notes  °[facility] [address], [place] (336) 641-2100 Also accepts self-pay patients.  °[facility] [address], [place] ° (336) 856-9996   °[facility] [address], [place]  (336) 288-8857   °[facility] [address] [place] [address], [place] (336) 299-7000   °Veita Bland [address], [place]  ° (336) 373-1557 Only accepts [place] Access Medicaid patients after they have their name applied to their card.  ° °Self-Pay (no insurance) in [place]: ° °Organization         Address  Phone   Notes  °Sickle Cell Patients, [facility] [address], [place] (336) 832-1970   °[facility] [address], [place] (336) 832-4400   °[facility] ° [address], [place] (336) 992-4800   °[facility]/Dr. Osei-Bonsu ° [address] [place] [address], [place] or [address], [place] (336) 841-8500 Phone number for both [place] and [place] locations is the same.  °[facility] [address], [place] (336) 299-0000   °[facility] [address] [place] [address], [place] or [address] (336) 852-7530 °(336) 878-2260   °[facility] [address], [place] (336) 350-1642, phone; (336) 294-5005, fax Sees patients 1st and 3rd Saturday of every month.  Must not qualify for public or private insurance (i.e. Medicaid, Medicare, [place] Health Choice, Veterans' Benefits) • Household income should be no more than 200% of the poverty level •The clinic cannot treat you if you are pregnant or think you are pregnant • Sexually transmitted diseases are not treated at the clinic.  ° ° °Dental Care: °Organization         Address  Phone  Notes  °[place] [facility] [address], [place] (336) 641-6152 Accepts children up to age 21 who are enrolled in Medicaid or [place] Health Choice; pregnant women with a Medicaid card; and children who have applied for Medicaid or [place] Health Choice, but were declined, whose parents can pay a reduced fee at time of service.  °[place]  [facility] [place]  [address], [place] (336) 641-7733 Accepts children up to age 21 who are enrolled in Medicaid or [place] Health Choice; pregnant women with a Medicaid card; and children who have applied for Medicaid or [place] Health Choice, but were declined, whose parents can pay a reduced fee at time of service.  °[facility] ° [address], [place] (336) 641-4533 Patients are seen by appointment only. Walk-ins are not accepted. [facility] will see patients 18 years of age and older. °Monday - Tuesday (8am-5pm) °Most Wednesdays (8:30-5pm) °$30 per visit, cash only  °[facility] ° [address], [place] (336) 641-4533 Patients are seen by appointment only. Walk-ins are not accepted. [facility] will see patients 18 years of age and older. °One   Wednesday Evening (Monthly: Volunteer Based).  $30 per visit, cash only  °[facility]  (919) 537-3737 for adults; Children under age 4, call Graduate Pediatric Dentistry at ([phone]) [phone]. Children aged 4-14, please call ([phone]) [phone] to request a pediatric application. ° Dental services are provided in all areas of dental care including fillings, crowns and bridges, complete and partial dentures, implants, gum treatment, root canals, and extractions. Preventive care is also provided. Treatment is provided to both adults and children. °Patients are selected via a lottery and there is often a waiting list. °  °[facility] [address], °[place] ° (336) 763-8833 [email or web address] °  °[facility] [address], [place], [place] (336)723-1848, Ext. 123 Second and Fourth Thursday of each month, opens at 6:30 AM; Clinic ends at 9 AM.  Patients are seen on a first-come first-served basis, and a limited number are seen during each clinic.  ° °[facility] ° [address], [place], [place] (336) 723-7904    Eligibility Requirements °You must have lived in [place], [place], or [place] counties for at least the last three months. °  You cannot be eligible for state or federal sponsored healthcare insurance, including Veterans Administration, Medicaid, or Medicare. °  You generally cannot be eligible for healthcare insurance through your employer.  °  How to apply: °Eligibility screenings are held every Tuesday and Wednesday afternoon from 1:00 pm until 4:00 pm. You do not need an appointment for the interview!  °[facility] [address], [place], [place] 336-631-2330   °[facility]  336-342-8273   °[facility]  336-703-3100   °[facility]  336-570-6415   ° °Behavioral Health Resources in the Community: °Intensive Outpatient Programs °Organization         Address  Phone  Notes  °[facility] [address], [place], [place] 336-878-6098   °[facility] [address], [place], [place] 336-832-9800   °[facility] [address], [place], [place] ° 336-882-2125   °[facility] [address],  °[place], [place] 1-800-853-5163 or 336-641-4981   °Substance Abuse Resources °Organization         Address  Phone  Notes  °[facility]  336-882-2125   °[facility]  336-784-9470   °[facility]  336-285-9073   °[facility]  336-845-3988   °Residential & Outpatient Substance Abuse Program  1-800-659-3381   °Psychological Services °Organization         Address  Phone  Notes  °[facility]  336- 832-9600   °[facility]  336- 378-7881   °[facility] [address], [place] 1-800-853-5163 or 336-641-4981   ° °Mobile Crisis Teams °Organization         Address  Phone  Notes  °[facility], Mobile Crisis Care Unit  1-877-626-1772   °Assertive °[facility] ° [address]  [place], [place] 336-834-9664   °Sharon DeEsch [address] °[place] 336-554-5454   ° °Self-Help/Support Groups °Organization         Address  Phone             Notes  °[facility]  - variety of support groups  336- 373-1402 Call for more information  °Narcotics Anonymous (NA), [facility] [address], °[place]  2 meetings at this location  ° °  Residential Treatment Programs Organization         Address  Phone  Notes  [facility] [address],    [place]  [phone]-(450)[phone]   [facility]  [address], [place] [phone], [place], [place] [phone]   [facility] [address], [place] [phone] Admissions: 8am-3pm M-F  [facility] [address]    [place], [place] [phone](702)[phone]   [facility] [address], [place], [place] [phone]   [facility] [address]  [place], [place] [phone]([phone]   [facility] Intensive Outpatient [address], [place], [place] [phone]   [facility] [address]  [place], [place] [phone] or (782) [phone](614) [phone]   [facility] [address] [place], [place] [phone] Accepts Medicaid  [facility] [address]  [place] [phone] Substance Abuse/Addiction Treatment   [facility]         Address  Phone  Notes  [facility]  682-837-8171(888) 213-186-0782   Angie FavaJulie Brannon, PhD [address], [address] [place], [place]   [phone](336) (737) [phone] or [phone](336) [phone]   [facility]   [address] [place], [place] [phone](336) [phone]   [facility] [address], [place], [place] [phone](336) ([phone]) [phone] Insurance/Medicaid/sponsorship through [facility] [address]                                    [place], [place] [phone](336) ([phone] Therapy/tele-psych/case    [facility] [address]   [place], [place] [phone](336) [phone]    Dr. Lolly MustacheArfeen  [phone]) [phone]   [facility]  [employer] [facility] 1) [address], [place] 2) [address], [place] 3)  [address], [place] ([phone](336) [phone](336) [phone]  [phone](336) [phone]   [facility] Child Abuse Hotline 640-848-0186(336) 2407775574 or [phone](336) [phone] (After Hours)      Begin to take over the counter stool softener (colace, miralax), as directed on packaging, for the next month.  Continue to take your usual prescriptions as previously directed.  Do not take extra tylenol when taking the narcotic pain medication because it already has tylenol in it. Apply moist heat or ice to the area(s) of discomfort, for 15 minutes at a time, several times per day for the next few days.  Do not fall asleep on a heating or ice pack.  Call your regular medical doctor today to schedule a follow up appointment in the next 2 days.  Return to the Emergency Department immediately if worsening.

## 2015-01-11 LAB — URINE CULTURE

## 2015-01-28 ENCOUNTER — Encounter (HOSPITAL_COMMUNITY): Payer: Self-pay | Admitting: *Deleted

## 2015-01-28 ENCOUNTER — Inpatient Hospital Stay (HOSPITAL_COMMUNITY)
Admission: EM | Admit: 2015-01-28 | Discharge: 2015-01-31 | DRG: 065 | Disposition: A | Discharge status: Skilled Nursing Facility | Payer: 59 | Attending: Internal Medicine | Admitting: Internal Medicine

## 2015-01-28 ENCOUNTER — Emergency Department (HOSPITAL_COMMUNITY): Payer: 59

## 2015-01-28 DIAGNOSIS — I639 Cerebral infarction, unspecified: Secondary | ICD-10-CM | POA: Diagnosis not present

## 2015-01-28 DIAGNOSIS — K219 Gastro-esophageal reflux disease without esophagitis: Secondary | ICD-10-CM | POA: Diagnosis present

## 2015-01-28 DIAGNOSIS — R4182 Altered mental status, unspecified: Secondary | ICD-10-CM | POA: Diagnosis not present

## 2015-01-28 DIAGNOSIS — H409 Unspecified glaucoma: Secondary | ICD-10-CM | POA: Diagnosis present

## 2015-01-28 DIAGNOSIS — E871 Hypo-osmolality and hyponatremia: Secondary | ICD-10-CM | POA: Diagnosis present

## 2015-01-28 DIAGNOSIS — E039 Hypothyroidism, unspecified: Secondary | ICD-10-CM | POA: Diagnosis present

## 2015-01-28 DIAGNOSIS — H9190 Unspecified hearing loss, unspecified ear: Secondary | ICD-10-CM | POA: Diagnosis present

## 2015-01-28 DIAGNOSIS — R410 Disorientation, unspecified: Secondary | ICD-10-CM

## 2015-01-28 DIAGNOSIS — Z8249 Family history of ischemic heart disease and other diseases of the circulatory system: Secondary | ICD-10-CM

## 2015-01-28 DIAGNOSIS — Z66 Do not resuscitate: Secondary | ICD-10-CM | POA: Diagnosis present

## 2015-01-28 LAB — CBC WITH DIFFERENTIAL/PLATELET
BASOS ABS: 0 10*3/uL (ref 0.0–0.1)
BASOS PCT: 0 % (ref 0–1)
EOS ABS: 0 10*3/uL (ref 0.0–0.7)
EOS PCT: 1 % (ref 0–5)
HCT: 37 % (ref 36.0–46.0)
Hemoglobin: 12.1 g/dL (ref 12.0–15.0)
Lymphocytes Relative: 23 % (ref 12–46)
Lymphs Abs: 1.4 10*3/uL (ref 0.7–4.0)
MCH: 30.3 pg (ref 26.0–34.0)
MCHC: 32.7 g/dL (ref 30.0–36.0)
MCV: 92.5 fL (ref 78.0–100.0)
MONOS PCT: 9 % (ref 3–12)
Monocytes Absolute: 0.6 10*3/uL (ref 0.1–1.0)
NEUTROS PCT: 67 % (ref 43–77)
Neutro Abs: 3.9 10*3/uL (ref 1.7–7.7)
PLATELETS: 360 10*3/uL (ref 150–400)
RBC: 4 MIL/uL (ref 3.87–5.11)
RDW: 16.1 % — ABNORMAL HIGH (ref 11.5–15.5)
WBC: 5.9 10*3/uL (ref 4.0–10.5)

## 2015-01-28 LAB — COMPREHENSIVE METABOLIC PANEL
ALK PHOS: 89 U/L (ref 39–117)
ALT: 15 U/L (ref 0–35)
AST: 21 U/L (ref 0–37)
Albumin: 3.6 g/dL (ref 3.5–5.2)
Anion gap: 8 (ref 5–15)
BILIRUBIN TOTAL: 0.6 mg/dL (ref 0.3–1.2)
BUN: 16 mg/dL (ref 6–23)
CALCIUM: 8.9 mg/dL (ref 8.4–10.5)
CHLORIDE: 96 mmol/L (ref 96–112)
CO2: 28 mmol/L (ref 19–32)
Creatinine, Ser: 0.84 mg/dL (ref 0.50–1.10)
GFR calc Af Amer: 64 mL/min — ABNORMAL LOW (ref >90)
GFR, EST NON AFRICAN AMERICAN: 55 mL/min — AB (ref >90)
Glucose, Bld: 104 mg/dL — ABNORMAL HIGH (ref 70–99)
POTASSIUM: 4.5 mmol/L (ref 3.5–5.1)
Sodium: 132 mmol/L — ABNORMAL LOW (ref 135–145)
TOTAL PROTEIN: 7.2 g/dL (ref 6.0–8.3)

## 2015-01-28 LAB — I-STAT CG4 LACTIC ACID, ED: Lactic Acid, Venous: 1.5 mmol/L (ref 0.5–2.0)

## 2015-01-28 LAB — TROPONIN I

## 2015-01-28 LAB — LIPASE, BLOOD: LIPASE: 37 U/L (ref 11–59)

## 2015-01-28 MED ORDER — CALCITONIN (SALMON) 200 UNIT/ACT NA SOLN
1.0000 | Freq: Every day | NASAL | Status: DC
  Administered 2015-01-29 – 2015-01-31 (×3): 1 via NASAL
  Filled 2015-01-28: qty 3.7

## 2015-01-28 MED ORDER — SODIUM CHLORIDE 0.9 % IV SOLN
INTRAVENOUS | Status: AC
  Administered 2015-01-29 (×2): via INTRAVENOUS

## 2015-01-28 MED ORDER — CALCIUM POLYCARBOPHIL 625 MG PO TABS
625.0000 mg | ORAL_TABLET | Freq: Every day | ORAL | Status: DC
  Administered 2015-01-29 – 2015-01-30 (×2): 625 mg via ORAL
  Filled 2015-01-28 (×4): qty 1

## 2015-01-28 MED ORDER — ACETAMINOPHEN 650 MG RE SUPP: 650.0000 mg | Freq: Four times a day (QID) | RECTAL | Status: DC | PRN

## 2015-01-28 MED ORDER — CALCIUM CARBONATE ANTACID 500 MG PO CHEW: CHEWABLE_TABLET | Freq: Every evening | ORAL | Status: DC | PRN

## 2015-01-28 MED ORDER — SODIUM CHLORIDE 0.9 % IV BOLUS (SEPSIS)
250.0000 mL | Freq: Once | INTRAVENOUS | Status: AC
  Administered 2015-01-28: 250 mL via INTRAVENOUS

## 2015-01-28 MED ORDER — SODIUM CHLORIDE 0.9 % IV SOLN
INTRAVENOUS | Status: DC
  Administered 2015-01-28 – 2015-01-30 (×2): via INTRAVENOUS

## 2015-01-28 MED ORDER — ACETAMINOPHEN 325 MG PO TABS: 650.0000 mg | ORAL_TABLET | Freq: Four times a day (QID) | ORAL | Status: DC | PRN

## 2015-01-28 MED ORDER — LEVOTHYROXINE SODIUM 75 MCG PO TABS
75.0000 ug | ORAL_TABLET | Freq: Every day | ORAL | Status: DC
  Administered 2015-01-29 – 2015-01-31 (×3): 75 ug via ORAL
  Filled 2015-01-28 (×3): qty 1

## 2015-01-28 MED ORDER — ENOXAPARIN SODIUM 40 MG/0.4ML ~~LOC~~ SOLN
40.0000 mg | SUBCUTANEOUS | Status: DC
Start: 2015-01-29 — End: 2015-01-30
  Administered 2015-01-29 – 2015-01-30 (×2): 40 mg via SUBCUTANEOUS
  Filled 2015-01-28 (×2): qty 0.4

## 2015-01-28 MED ORDER — IBUPROFEN 600 MG PO TABS: 600.0000 mg | ORAL_TABLET | Freq: Three times a day (TID) | ORAL | Status: DC | PRN

## 2015-01-28 MED ORDER — SODIUM CHLORIDE 0.9 % IJ SOLN
3.0000 mL | Freq: Two times a day (BID) | INTRAMUSCULAR | Status: DC
  Administered 2015-01-30: 3 mL via INTRAVENOUS

## 2015-01-28 MED ORDER — SODIUM CHLORIDE 0.9 % IV SOLN: INTRAVENOUS | Status: AC

## 2015-01-28 MED ORDER — POLYETHYLENE GLYCOL 3350 17 G PO PACK
17.0000 g | PACK | Freq: Every day | ORAL | Status: DC | PRN
  Administered 2015-01-30: 17 g via ORAL
  Filled 2015-01-28: qty 1

## 2015-01-28 MED ORDER — VITAMIN D 1000 UNITS PO TABS
ORAL_TABLET | Freq: Every morning | ORAL | Status: DC
  Administered 2015-01-29 – 2015-01-31 (×3): 1000 [IU] via ORAL
  Filled 2015-01-28 (×3): qty 1

## 2015-01-28 MED ORDER — TIMOLOL HEMIHYDRATE 0.5 % OP SOLN: 1.0000 [drp] | Freq: Every day | OPHTHALMIC | Status: DC

## 2015-01-28 NOTE — ED Notes (Signed)
Attempted to obtain in and out cath, no success. Updated EDP. Will try bedside toilet.

## 2015-01-28 NOTE — ED Notes (Signed)
Per EMS, Pt hs been confused ~ 2 hours every afternoon x 3 days. Pt was stated on Voltaren Tuesday and was taking it twice daily. Family recently cut med back down to 1 tab because they thought it was making her confused. Pt was outside today, looking for her son as she was talking to the same son she was looking for.

## 2015-01-28 NOTE — ED Provider Notes (Signed)
CSN: [id number]     Arrival date & time 01/28/15  1811 History   First MD Initiated Contact with Patient 01/28/15 1838     Chief Complaint  Patient presents with  . Altered Mental Status     (Consider location/radiation/quality/duration/timing/severity/associated sxs/prior Treatment) Patient is a 81 y.o. female presenting with altered mental status. The history is provided by the patient and a relative. The history is limited by the condition of the patient.  Altered Mental Status  level V caveat applies due to patient's confusion.  Patient brought in by family members. Patient does live with family members. Patient has been confused for the past 3 days mostly in the afternoon. Today try to take her walker and walk outside and walk way. Patient's appetite is good as been no fevers no obvious medical problem. However this behavior is very unusual for her. Recently started on a nonsteroidal anti-inflammatory for her treatment of her pain from her pelvic fracture. According to family members patient's ambulating just fine but does appear confused. Will follow commands.  Past Medical History  Diagnosis Date  . Glaucoma   . Thyroid disease   . GERD (gastroesophageal reflux disease)   . HOH (hard of hearing)   . Pelvic fracture     snf  . Back pain    Past Surgical History  Procedure Laterality Date  . Abdominal surgery     Family History  Problem Relation Age of Onset  . Cancer Sister     unknown  . Cancer Sister     unknown  . Coronary artery disease Father   . Cancer Mother     died age 47   History  Substance Use Topics  . Smoking status: Never Smoker   . Smokeless tobacco: Never Used  . Alcohol Use: No   OB History    No data available     Review of Systems  Unable to perform ROS  level V caveat applies due to patient's confusion she will follow commands however.    Allergies  Latex; Onion; Other; Penicillins; and Sulfa antibiotics  Home Medications    Prior to Admission medications   Medication Sig Start Date End Date Taking? Authorizing Provider  acetaminophen (TYLENOL) 325 MG tablet Take 2 tablets (650 mg total) by mouth every 6 (six) hours as needed for mild pain. 09/29/14  Yes Lesle Chris Black, NP  calcitonin, salmon, (MIACALCIN/FORTICAL) 200 UNIT/ACT nasal spray Place 1 spray into alternate nostrils daily.  01/24/15  Yes Historical Provider, MD  calcium carbonate (TUMS EX) 750 MG chewable tablet Chew 1 tablet by mouth at bedtime as needed for heartburn.    Yes Historical Provider, MD  Calcium Carbonate-Vitamin D (CALCIUM 600 + D PO) Take 1 tablet by mouth every morning.    Yes Historical Provider, MD  Cholecalciferol (VITAMIN D3) 1000 UNITS CAPS Take 1 capsule by mouth every morning.    Yes Historical Provider, MD  diclofenac (VOLTAREN) 75 MG EC tablet Take 75 mg by mouth 2 (two) times daily.   Yes Historical Provider, MD  ibuprofen (ADVIL,MOTRIN) 200 MG tablet Take 600 mg by mouth every 8 (eight) hours as needed for mild pain.    Yes Historical Provider, MD  levothyroxine (SYNTHROID, LEVOTHROID) 75 MCG tablet Take 75 mcg by mouth daily before breakfast.   Yes Historical Provider, MD  polycarbophil (FIBERCON) 625 MG tablet Take 625 mg by mouth at bedtime.   Yes Historical Provider, MD  polyethylene glycol (MIRALAX / GLYCOLAX) packet Take 17  g by mouth daily as needed.   Yes Historical Provider, MD  timolol (BETIMOL) 0.5 % ophthalmic solution Place 1 drop into both eyes at bedtime.     Yes Historical Provider, MD  HYDROcodone-acetaminophen (NORCO/VICODIN) 5-325 MG per tablet Take 1 tablet by mouth every 4 (four) hours as needed for moderate pain. Patient not taking: Reported on 01/10/2015 01/05/15   Juliet Rude. Pickering, MD   BP 163/78 mmHg  Pulse 89  Temp(Src) 97.9 F (36.6 C) (Oral)  Resp 29  SpO2 97% Physical Exam  Constitutional: She appears well-developed and well-nourished. No distress.  HENT:  Head: Normocephalic and atraumatic.   Mouth/Throat: Oropharynx is clear and moist.  Eyes: Conjunctivae and EOM are normal. Pupils are equal, round, and reactive to light.  Neck: Normal range of motion. Neck supple.  Cardiovascular: Normal rate, regular rhythm and normal heart sounds.   No murmur heard. Pulmonary/Chest: Effort normal and breath sounds normal. No respiratory distress.  Abdominal: Soft. Bowel sounds are normal. There is no tenderness.  Musculoskeletal: Normal range of motion.  Neurological: She is alert. No cranial nerve deficit. She exhibits normal muscle tone. Coordination normal.  Skin: Skin is warm. No rash noted.  Nursing note and vitals reviewed.   ED Course  Procedures (including critical care time) Labs Review Labs Reviewed  COMPREHENSIVE METABOLIC PANEL - Abnormal; Notable for the following:    Sodium 132 (*)    Glucose, Bld 104 (*)    GFR calc non Af Amer 55 (*)    GFR calc Af Amer 64 (*)    All other components within normal limits  CBC WITH DIFFERENTIAL/PLATELET - Abnormal; Notable for the following:    RDW 16.1 (*)    All other components within normal limits  LIPASE, BLOOD  TROPONIN I  URINALYSIS, ROUTINE W REFLEX MICROSCOPIC  I-STAT CG4 LACTIC ACID, ED   Results for orders placed or performed during the hospital encounter of 01/28/15  Comprehensive metabolic panel  Result Value Ref Range   Sodium 132 (L) 135 - 145 mmol/L   Potassium 4.5 3.5 - 5.1 mmol/L   Chloride 96 96 - 112 mmol/L   CO2 28 19 - 32 mmol/L   Glucose, Bld 104 (H) 70 - 99 mg/dL   BUN 16 6 - 23 mg/dL   Creatinine, Ser 1.61 0.50 - 1.10 mg/dL   Calcium 8.9 8.4 - 09.6 mg/dL   Total Protein 7.2 6.0 - 8.3 g/dL   Albumin 3.6 3.5 - 5.2 g/dL   AST 21 0 - 37 U/L   ALT 15 0 - 35 U/L   Alkaline Phosphatase 89 39 - 117 U/L   Total Bilirubin 0.6 0.3 - 1.2 mg/dL   GFR calc non Af Amer 55 (L) >90 mL/min   GFR calc Af Amer 64 (L) >90 mL/min   Anion gap 8 5 - 15  Lipase, blood  Result Value Ref Range   Lipase 37 11 - 59 U/L   Troponin I  Result Value Ref Range   Troponin I <0.03 <0.031 ng/mL  CBC with Differential/Platelet  Result Value Ref Range   WBC 5.9 4.0 - 10.5 K/uL   RBC 4.00 3.87 - 5.11 MIL/uL   Hemoglobin 12.1 12.0 - 15.0 g/dL   HCT 04.5 40.9 - 81.1 %   MCV 92.5 78.0 - 100.0 fL   MCH 30.3 26.0 - 34.0 pg   MCHC 32.7 30.0 - 36.0 g/dL   RDW 91.4 (H) 78.2 - 95.6 %   Platelets 360  150 - 400 K/uL   Neutrophils Relative % 67 43 - 77 %   Neutro Abs 3.9 1.7 - 7.7 K/uL   Lymphocytes Relative 23 12 - 46 %   Lymphs Abs 1.4 0.7 - 4.0 K/uL   Monocytes Relative 9 3 - 12 %   Monocytes Absolute 0.6 0.1 - 1.0 K/uL   Eosinophils Relative 1 0 - 5 %   Eosinophils Absolute 0.0 0.0 - 0.7 K/uL   Basophils Relative 0 0 - 1 %   Basophils Absolute 0.0 0.0 - 0.1 K/uL  I-Stat CG4 Lactic Acid, ED  Result Value Ref Range   Lactic Acid, Venous 1.50 0.5 - 2.0 mmol/L     Imaging Review Dg Chest 1 View  01/28/2015   CLINICAL DATA:  Altered mental status, confusion  EXAM: CHEST - 1 VIEW  COMPARISON:  None.  FINDINGS: The patient is rotated towards the right. There is bilateral chronic interstitial thickening. There is no focal parenchymal opacity, pleural effusion, or pneumothorax. The heart and mediastinal contours are unremarkable.  There is calcific tendinosis of bilateral rotator cuffs. There is no acute osseous abnormality.  IMPRESSION: No active disease.   Electronically Signed   By: Elige Ko   On: 01/28/2015 20:43   Ct Head Wo Contrast  01/28/2015   CLINICAL DATA:  Confusion for 2 hours every afternoon for 3 days.  EXAM: CT HEAD WITHOUT CONTRAST  TECHNIQUE: Contiguous axial images were obtained from the base of the skull through the vertex without intravenous contrast.  COMPARISON:  None.  FINDINGS: The brain is atrophic with chronic microvascular ischemic change. No evidence of acute intracranial abnormality including hemorrhage, infarct, mass lesion, mass effect, midline shift or abnormal extra-axial fluid  collections identified. No hydrocephalus or pneumocephalus. Calvarium intact.  IMPRESSION: No acute finding.  Atrophy and chronic microvascular ischemic change.   Electronically Signed   By: Drusilla Kanner M.D.   On: 01/28/2015 20:36     EKG Interpretation   Date/Time:  Saturday January 28 2015 19:30:07 EST Ventricular Rate:  89 PR Interval:  131 QRS Duration: 70 QT Interval:  354 QTC Calculation: 431 R Axis:   83 Text Interpretation:  Sinus rhythm Consider right atrial enlargement  Borderline right axis deviation Artifact in lead(s) I II III aVR aVL aVF  V1 Artifact No significant change since last tracing Confirmed by  Darely Becknell  MD, Jung Yurchak [phone]) on 01/28/2015 8:14:17 PM      MDM   Final diagnoses:  Confusion    Patient brought in by family for several days of increased confusion somewhat like sundowning. Patient x-ray took her walker and walked outside today some and she never normally does appears to be confused. Patient without any specific complaints has been eating well. No fevers that they could tell no nausea vomiting patient did not appear to be any pain. Workup here without evidence of any significant infection urinalysis is still pending and that's a possible culprit.  Head CT negative chest x-ray negative for pneumonia. EKG without acute changes. Troponin was negative no evidence of an acute cardiac event. Lactic acid was not elevated it was less than 2. No leukocytosis no significant anemia. Patient neurologically is intact and will follow commands.  If urinalysis is negative patient can be discharged home for further evaluation. Patient's primary care doctor is Dr. Mardelle Matte at [facility].  Patients the only new medication is diclofenac however the series of side effects do not seem to fit that. Again if urinalysis is negative patient  can be discharged home. If it does show evidence of infection that admission may be appropriate. Or trial of medications at home would be  appropriate.       Vanetta MuldersScott Kynzleigh Bandel, MD 01/28/15 2148

## 2015-01-28 NOTE — H&P (Signed)
Tina Lane is an 81 y.o. female.    Pcp: Billey Chang  Chief Complaint: ams HPI: 81 yo female hard of hearing apparently was found wandering in the yard.  Pt was acting confused per her family and therefore brought to the ED for evaluation  So far CT brain was negative for any acute process.  ua  Is pending.  Pt will be admitted for w/up of AMS.   Past Medical History  Diagnosis Date  . Glaucoma   . Thyroid disease   . GERD (gastroesophageal reflux disease)   . HOH (hard of hearing)   . Pelvic fracture     snf  . Back pain     Past Surgical History  Procedure Laterality Date  . Abdominal surgery    . Hemorrhoid surgery    . Cataract extraction      Family History  Problem Relation Age of Onset  . Cancer Sister     unknown  . Cancer Sister     unknown  . Coronary artery disease Father   . Cancer Mother     died age 65   Social History:  reports that she has never smoked. She has never used smokeless tobacco. She reports that she does not drink alcohol or use illicit drugs.  Allergies:  Allergies  Allergen Reactions  . Latex Itching  . Onion Nausea And Vomiting  . Other     SPANDEX ALLERGY  . Penicillins Other (See Comments)    UNKNOWN REACTION  . Sulfa Antibiotics Other (See Comments)    UNKNOWN REACTION   Medications: reviewed  (Not in a hospital admission)  Results for orders placed or performed during the hospital encounter of 01/28/15 (from the past 48 hour(s))  Comprehensive metabolic panel     Status: Abnormal   Collection Time: 01/28/15  7:38 PM  Result Value Ref Range   Sodium 132 (L) 135 - 145 mmol/L   Potassium 4.5 3.5 - 5.1 mmol/L   Chloride 96 96 - 112 mmol/L   CO2 28 19 - 32 mmol/L   Glucose, Bld 104 (H) 70 - 99 mg/dL   BUN 16 6 - 23 mg/dL   Creatinine, Ser 0.84 0.50 - 1.10 mg/dL   Calcium 8.9 8.4 - 10.5 mg/dL   Total Protein 7.2 6.0 - 8.3 g/dL   Albumin 3.6 3.5 - 5.2 g/dL   AST 21 0 - 37 U/L   ALT 15 0 - 35 U/L   Alkaline  Phosphatase 89 39 - 117 U/L   Total Bilirubin 0.6 0.3 - 1.2 mg/dL   GFR calc non Af Amer 55 (L) >90 mL/min   GFR calc Af Amer 64 (L) >90 mL/min    Comment: (NOTE) The eGFR has been calculated using the CKD EPI equation. This calculation has not been validated in all clinical situations. eGFR's persistently <90 mL/min signify possible Chronic Kidney Disease.    Anion gap 8 5 - 15  Lipase, blood     Status: None   Collection Time: 01/28/15  7:38 PM  Result Value Ref Range   Lipase 37 11 - 59 U/L  Troponin I     Status: None   Collection Time: 01/28/15  7:38 PM  Result Value Ref Range   Troponin I <0.03 <0.031 ng/mL    Comment:        NO INDICATION OF MYOCARDIAL INJURY.   CBC with Differential/Platelet     Status: Abnormal   Collection Time: 01/28/15  7:38 PM  Result Value Ref Range   WBC 5.9 4.0 - 10.5 K/uL   RBC 4.00 3.87 - 5.11 MIL/uL   Hemoglobin 12.1 12.0 - 15.0 g/dL   HCT 37.0 36.0 - 46.0 %   MCV 92.5 78.0 - 100.0 fL   MCH 30.3 26.0 - 34.0 pg   MCHC 32.7 30.0 - 36.0 g/dL   RDW 16.1 (H) 11.5 - 15.5 %   Platelets 360 150 - 400 K/uL   Neutrophils Relative % 67 43 - 77 %   Neutro Abs 3.9 1.7 - 7.7 K/uL   Lymphocytes Relative 23 12 - 46 %   Lymphs Abs 1.4 0.7 - 4.0 K/uL   Monocytes Relative 9 3 - 12 %   Monocytes Absolute 0.6 0.1 - 1.0 K/uL   Eosinophils Relative 1 0 - 5 %   Eosinophils Absolute 0.0 0.0 - 0.7 K/uL   Basophils Relative 0 0 - 1 %   Basophils Absolute 0.0 0.0 - 0.1 K/uL  I-Stat CG4 Lactic Acid, ED     Status: None   Collection Time: 01/28/15  7:49 PM  Result Value Ref Range   Lactic Acid, Venous 1.50 0.5 - 2.0 mmol/L   Dg Chest 1 View  01/28/2015   CLINICAL DATA:  Altered mental status, confusion  EXAM: CHEST - 1 VIEW  COMPARISON:  None.  FINDINGS: The patient is rotated towards the right. There is bilateral chronic interstitial thickening. There is no focal parenchymal opacity, pleural effusion, or pneumothorax. The heart and mediastinal contours are  unremarkable.  There is calcific tendinosis of bilateral rotator cuffs. There is no acute osseous abnormality.  IMPRESSION: No active disease.   Electronically Signed   By: Kathreen Devoid   On: 01/28/2015 20:43   Ct Head Wo Contrast  01/28/2015   CLINICAL DATA:  Confusion for 2 hours every afternoon for 3 days.  EXAM: CT HEAD WITHOUT CONTRAST  TECHNIQUE: Contiguous axial images were obtained from the base of the skull through the vertex without intravenous contrast.  COMPARISON:  None.  FINDINGS: The brain is atrophic with chronic microvascular ischemic change. No evidence of acute intracranial abnormality including hemorrhage, infarct, mass lesion, mass effect, midline shift or abnormal extra-axial fluid collections identified. No hydrocephalus or pneumocephalus. Calvarium intact.  IMPRESSION: No acute finding.  Atrophy and chronic microvascular ischemic change.   Electronically Signed   By: Inge Rise M.D.   On: 01/28/2015 20:36    Review of Systems  Constitutional: Negative for fever, chills, weight loss, malaise/fatigue and diaphoresis.  HENT: Negative for congestion, ear discharge, ear pain, hearing loss, nosebleeds, sore throat and tinnitus.   Eyes: Negative for blurred vision, double vision, photophobia, pain, discharge and redness.  Respiratory: Positive for cough. Negative for hemoptysis, sputum production, shortness of breath, wheezing and stridor.   Cardiovascular: Negative for chest pain, palpitations, orthopnea, claudication, leg swelling and PND.  Gastrointestinal: Positive for constipation. Negative for heartburn, nausea, vomiting, abdominal pain, diarrhea, blood in stool and melena.  Genitourinary: Negative for dysuria, urgency, frequency, hematuria and flank pain.  Musculoskeletal: Negative for myalgias, back pain, joint pain, falls and neck pain.  Skin: Negative for itching and rash.  Neurological: Negative for dizziness, tingling, tremors, sensory change, speech change, focal  weakness, seizures, loss of consciousness, weakness and headaches.  Endo/Heme/Allergies: Negative for environmental allergies and polydipsia. Does not bruise/bleed easily.  Psychiatric/Behavioral: Negative for depression, suicidal ideas, hallucinations, memory loss and substance abuse. The patient is not nervous/anxious and does not have insomnia.  Blood pressure 163/78, pulse 89, temperature 97.9 F (36.6 C), temperature source Oral, resp. rate 29, SpO2 97 %. Physical Exam  Constitutional: She is oriented to person, place, and time. She appears well-developed and well-nourished.  HENT:  Head: Normocephalic and atraumatic.  Eyes: Conjunctivae and EOM are normal. Pupils are equal, round, and reactive to light.  Neck: Normal range of motion. Neck supple.  Cardiovascular: Normal rate and regular rhythm.  Exam reveals no gallop and no friction rub.   No murmur heard. Respiratory: Effort normal and breath sounds normal. No respiratory distress. She has no wheezes. She has no rales. She exhibits no tenderness.  GI: Soft. Bowel sounds are normal. She exhibits no distension and no mass. There is no tenderness. There is no rebound and no guarding.  Musculoskeletal: Normal range of motion. She exhibits no edema or tenderness.  Neurological: She is alert and oriented to person, place, and time. She has normal reflexes. She displays normal reflexes. No cranial nerve deficit. She exhibits normal muscle tone. Coordination normal.  Skin: Skin is warm and dry. No rash noted. No erythema. No pallor.     Assessment/Plan AMS Per family worse over the past month Check tsh, esr, ana, rpr, b12 Awaiting ua Check MRI brain Check EEG  Memory disorder: Pt is alert and ox2 (person, place)  Hypothyroidism Check tsh  DVT Cont coumadin  Hyponatremia Check cmp in am Hydrate gently with normal saline  Ameliya Nicotra 01/28/2015, 10:45 PM

## 2015-01-29 LAB — URINALYSIS, ROUTINE W REFLEX MICROSCOPIC
Bilirubin Urine: NEGATIVE
Glucose, UA: NEGATIVE mg/dL
Leukocytes, UA: NEGATIVE
Nitrite: NEGATIVE
Protein, ur: NEGATIVE mg/dL
Specific Gravity, Urine: 1.01 (ref 1.005–1.030)
UROBILINOGEN UA: 0.2 mg/dL (ref 0.0–1.0)
pH: 7 (ref 5.0–8.0)

## 2015-01-29 LAB — URINE MICROSCOPIC-ADD ON

## 2015-01-29 LAB — SEDIMENTATION RATE: Sed Rate: 12 mm/hr (ref 0–22)

## 2015-01-29 LAB — VITAMIN B12: Vitamin B-12: 935 pg/mL — ABNORMAL HIGH (ref 211–911)

## 2015-01-29 LAB — TSH: TSH: 1.344 u[IU]/mL (ref 0.350–4.500)

## 2015-01-29 MED ORDER — TIMOLOL MALEATE 0.5 % OP SOLN
1.0000 [drp] | Freq: Every day | OPHTHALMIC | Status: DC
  Administered 2015-01-29 – 2015-01-30 (×2): 1 [drp] via OPHTHALMIC
  Filled 2015-01-29: qty 5

## 2015-01-29 NOTE — Progress Notes (Signed)
[facility] PROGRESS NOTE  Tina Lane ZOX:[id number] DOB: [birth year] DOA: 01/28/2015 PCP: [name], MD  Assessment/Plan: 1. Altered mental status. Patient has been confused, acting strangely for the past several days. This is progressively getting worse. Chest x-ray and urinalysis were unremarkable. CT scan of the head did not show any acute findings. MRI of the brain has been ordered. The patient was recently started on hydrocodone for back pain. Her son feels that this may be a contributing factor, which would be very likely. We'll continue observation at this point. 2. Hypothyroidism. Continue Synthroid. TSH normal 3. Hyponatremia. Possibly related to mild volume depletion. Continue IV fluids 4. DVT prophylaxis. Lovenox  Code Status: DO NOT RESUSCITATE Family Communication: Discussed with son at the bedside Disposition Plan: Discharge home once improved   Consultants:    Procedures:    Antibiotics:    HPI/Subjective: Patient is very hard of hearing. She is confused. She thinks she is at home.  Objective: Filed Vitals:   01/29/15 0655  BP: 154/67  Pulse: 96  Temp: 98 F (36.7 C)  Resp: 20   No intake or output data in the 24 hours ending 01/29/15 1710 Filed Weights   01/29/15 0655  Weight: 57.7 kg (127 lb 3.3 oz)    Exam:   General:  NAD, confused  Cardiovascular: s1, s2, rrr  Respiratory: cta b  Abdomen: soft, nt, nd, bs+  Musculoskeletal: no edema b/l   Data Reviewed: Basic Metabolic Panel:  Recent Labs Lab 01/28/15 1938  NA 132*  K 4.5  CL 96  CO2 28  GLUCOSE 104*  BUN 16  CREATININE 0.84  CALCIUM 8.9   Liver Function Tests:  Recent Labs Lab 01/28/15 1938  AST 21  ALT 15  ALKPHOS 89  BILITOT 0.6  PROT 7.2  ALBUMIN 3.6    Recent Labs Lab 01/28/15 1938  LIPASE 37   No results for input(s): AMMONIA in the last 168 hours. CBC:  Recent Labs Lab 01/28/15 1938  WBC 5.9  NEUTROABS 3.9  HGB 12.1  HCT 37.0   MCV 92.5  PLT 360   Cardiac Enzymes:  Recent Labs Lab 01/28/15 1938  TROPONINI <0.03   BNP (last 3 results) No results for input(s): PROBNP in the last 8760 hours. CBG: No results for input(s): GLUCAP in the last 168 hours.  No results found for this or any previous visit (from the past 240 hour(s)).   Studies: Dg Chest 1 View  01/28/2015   CLINICAL DATA:  Altered mental status, confusion  EXAM: CHEST - 1 VIEW  COMPARISON:  None.  FINDINGS: The patient is rotated towards the right. There is bilateral chronic interstitial thickening. There is no focal parenchymal opacity, pleural effusion, or pneumothorax. The heart and mediastinal contours are unremarkable.  There is calcific tendinosis of bilateral rotator cuffs. There is no acute osseous abnormality.  IMPRESSION: No active disease.   Electronically Signed   By: Elige Ko   On: 01/28/2015 20:43   Ct Head Wo Contrast  01/28/2015   CLINICAL DATA:  Confusion for 2 hours every afternoon for 3 days.  EXAM: CT HEAD WITHOUT CONTRAST  TECHNIQUE: Contiguous axial images were obtained from the base of the skull through the vertex without intravenous contrast.  COMPARISON:  None.  FINDINGS: The brain is atrophic with chronic microvascular ischemic change. No evidence of acute intracranial abnormality including hemorrhage, infarct, mass lesion, mass effect, midline shift or abnormal extra-axial fluid collections identified. No hydrocephalus or pneumocephalus. Calvarium intact.  IMPRESSION: No acute finding.  Atrophy and chronic microvascular ischemic change.   Electronically Signed   By: Drusilla Kannerhomas  Dalessio M.D.   On: 01/28/2015 20:36    Scheduled Meds: . sodium chloride   Intravenous STAT  . calcitonin (salmon)  1 spray Alternating Nares Daily  . cholecalciferol   Oral q morning - 10a  . enoxaparin (LOVENOX) injection  40 mg Subcutaneous Q24H  . levothyroxine  75 mcg Oral QAC breakfast  . polycarbophil  625 mg Oral QHS  . sodium chloride  3  mL Intravenous Q12H  . timolol  1 drop Both Eyes QHS   Continuous Infusions: . sodium chloride 75 mL/hr at 01/28/15 1946  . sodium chloride 75 mL/hr at 01/29/15 1250    Active Problems:   Hypothyroidism   Altered mental status   Hyponatremia    Time spent: 30mins    Tina Lane  [facility] Pager [phone]. If 7PM-7AM, please contact night-coverage at [email or web address], password [email or web address] 01/29/2015, 5:10 PM  LOS: 1 day

## 2015-01-29 NOTE — Progress Notes (Signed)
UR completed 

## 2015-01-30 ENCOUNTER — Observation Stay (HOSPITAL_COMMUNITY): Payer: 59

## 2015-01-30 ENCOUNTER — Observation Stay (HOSPITAL_COMMUNITY)
Admission: EM | Admit: 2015-01-30 | Discharge: 2015-01-30 | Disposition: A | Discharge status: Home / Self Care | Payer: 59 | Source: Home / Self Care | Attending: Internal Medicine | Admitting: Internal Medicine

## 2015-01-30 DIAGNOSIS — Z8249 Family history of ischemic heart disease and other diseases of the circulatory system: Secondary | ICD-10-CM | POA: Diagnosis not present

## 2015-01-30 DIAGNOSIS — R4182 Altered mental status, unspecified: Secondary | ICD-10-CM | POA: Diagnosis present

## 2015-01-30 DIAGNOSIS — H409 Unspecified glaucoma: Secondary | ICD-10-CM | POA: Diagnosis present

## 2015-01-30 DIAGNOSIS — I639 Cerebral infarction, unspecified: Secondary | ICD-10-CM | POA: Diagnosis present

## 2015-01-30 DIAGNOSIS — K219 Gastro-esophageal reflux disease without esophagitis: Secondary | ICD-10-CM | POA: Diagnosis present

## 2015-01-30 DIAGNOSIS — E039 Hypothyroidism, unspecified: Secondary | ICD-10-CM | POA: Diagnosis present

## 2015-01-30 DIAGNOSIS — H9190 Unspecified hearing loss, unspecified ear: Secondary | ICD-10-CM | POA: Diagnosis present

## 2015-01-30 DIAGNOSIS — Z66 Do not resuscitate: Secondary | ICD-10-CM | POA: Diagnosis present

## 2015-01-30 DIAGNOSIS — E871 Hypo-osmolality and hyponatremia: Secondary | ICD-10-CM | POA: Diagnosis present

## 2015-01-30 LAB — LIPID PANEL
Cholesterol: 105 mg/dL (ref 0–200)
HDL: 44 mg/dL (ref >39)
LDL CALC: 45 mg/dL (ref 0–99)
TRIGLYCERIDES: 78 mg/dL (ref <150)
Total CHOL/HDL Ratio: 2.4 RATIO
VLDL: 16 mg/dL (ref 0–40)

## 2015-01-30 LAB — ANA: ANA: NEGATIVE

## 2015-01-30 LAB — BASIC METABOLIC PANEL
Anion gap: 7 (ref 5–15)
BUN: 8 mg/dL (ref 6–23)
CALCIUM: 8.8 mg/dL (ref 8.4–10.5)
CHLORIDE: 101 mmol/L (ref 96–112)
CO2: 27 mmol/L (ref 19–32)
Creatinine, Ser: 0.62 mg/dL (ref 0.50–1.10)
GFR calc Af Amer: 83 mL/min — ABNORMAL LOW (ref >90)
GFR calc non Af Amer: 72 mL/min — ABNORMAL LOW (ref >90)
GLUCOSE: 98 mg/dL (ref 70–99)
Potassium: 3.8 mmol/L (ref 3.5–5.1)
Sodium: 135 mmol/L (ref 135–145)

## 2015-01-30 MED ORDER — AMLODIPINE BESYLATE 5 MG PO TABS
5.0000 mg | ORAL_TABLET | Freq: Every day | ORAL | Status: DC
  Administered 2015-01-30 – 2015-01-31 (×2): 5 mg via ORAL
  Filled 2015-01-30 (×2): qty 1

## 2015-01-30 MED ORDER — IBUPROFEN 600 MG PO TABS
600.0000 mg | ORAL_TABLET | Freq: Three times a day (TID) | ORAL | Status: DC | PRN
  Administered 2015-01-31: 600 mg via ORAL
  Filled 2015-01-30: qty 1

## 2015-01-30 MED ORDER — ENOXAPARIN SODIUM 30 MG/0.3ML ~~LOC~~ SOLN
30.0000 mg | SUBCUTANEOUS | Status: DC
Start: 2015-01-31 — End: 2015-01-31
  Administered 2015-01-31: 30 mg via SUBCUTANEOUS
  Filled 2015-01-30: qty 0.3

## 2015-01-30 MED ORDER — ASPIRIN EC 81 MG PO TBEC
81.0000 mg | DELAYED_RELEASE_TABLET | Freq: Every day | ORAL | Status: DC
  Administered 2015-01-30 – 2015-01-31 (×2): 81 mg via ORAL
  Filled 2015-01-30 (×2): qty 1

## 2015-01-30 NOTE — Progress Notes (Signed)
[facility] PROGRESS NOTE  Marchelle FolksOlivia M Senseney ZOX:[id number] DOB: Jun 01, 1912 DOA: 01/28/2015 PCP: Willow OraANDY,[name], MD  Assessment/Plan: 1. CVA. Patient has been confused, acting strangely for the past several days and was progressively getting worse. Chest x-ray and urinalysis were unremarkable. CT scan of the head did not show any acute findings. MRI of the brain showed small left parietal infarct. Will check carotid dopplers, echo, lipid panel and hgba1c. She will get a physical therapy evaluation. She has been swallowing without difficulty and does not need a formal swallow evaluation. She does not qualify for TPA due to delay in patient arrival. Will start the patient on aspirin 2. Altered mental status. Possibly related to #1, complicating factor includes that she was recently started on hydrocodone. Appears to be improving, although not back to baseline. 3. Hypothyroidism. Continue Synthroid. TSH normal 4. Hyponatremia. Possibly related to mild volume depletion. Improved with IVF 5. DVT prophylaxis. Lovenox  Code Status: DO NOT RESUSCITATE Family Communication: Discussed with son at the bedside Disposition Plan: pending physical therapy evaluation   Consultants:    Procedures:    Antibiotics:    HPI/Subjective: Patient is very hard of hearing. She is confused. She denies any complaints  Objective: Filed Vitals:   01/30/15 1508  BP: 166/76  Pulse: 79  Temp: 98.2 F (36.8 C)  Resp: 20    Intake/Output Summary (Last 24 hours) at 01/30/15 1838 Last data filed at 01/30/15 1756  Gross per 24 hour  Intake    240 ml  Output      1 ml  Net    239 ml   Filed Weights   01/29/15 0655  Weight: 57.7 kg (127 lb 3.3 oz)    Exam:   General:  NAD, confused, pleasant  Cardiovascular: s1, s2, rrr  Respiratory: cta b  Abdomen: soft, nt, nd, bs+  Musculoskeletal: no edema b/l   Data Reviewed: Basic Metabolic Panel:  Recent Labs Lab 01/28/15 1938 01/30/15 0628   NA 132* 135  K 4.5 3.8  CL 96 101  CO2 28 27  GLUCOSE 104* 98  BUN 16 8  CREATININE 0.84 0.62  CALCIUM 8.9 8.8   Liver Function Tests:  Recent Labs Lab 01/28/15 1938  AST 21  ALT 15  ALKPHOS 89  BILITOT 0.6  PROT 7.2  ALBUMIN 3.6    Recent Labs Lab 01/28/15 1938  LIPASE 37   No results for input(s): AMMONIA in the last 168 hours. CBC:  Recent Labs Lab 01/28/15 1938  WBC 5.9  NEUTROABS 3.9  HGB 12.1  HCT 37.0  MCV 92.5  PLT 360   Cardiac Enzymes:  Recent Labs Lab 01/28/15 1938  TROPONINI <0.03   BNP (last 3 results) No results for input(s): PROBNP in the last 8760 hours. CBG: No results for input(s): GLUCAP in the last 168 hours.  No results found for this or any previous visit (from the past 240 hour(s)).   Studies: Dg Chest 1 View  01/28/2015   CLINICAL DATA:  Altered mental status, confusion  EXAM: CHEST - 1 VIEW  COMPARISON:  None.  FINDINGS: The patient is rotated towards the right. There is bilateral chronic interstitial thickening. There is no focal parenchymal opacity, pleural effusion, or pneumothorax. The heart and mediastinal contours are unremarkable.  There is calcific tendinosis of bilateral rotator cuffs. There is no acute osseous abnormality.  IMPRESSION: No active disease.   Electronically Signed   By: Elige KoHetal  Patel   On: 01/28/2015 20:43   Ct  Head Wo Contrast  01/28/2015   CLINICAL DATA:  Confusion for 2 hours every afternoon for 3 days.  EXAM: CT HEAD WITHOUT CONTRAST  TECHNIQUE: Contiguous axial images were obtained from the base of the skull through the vertex without intravenous contrast.  COMPARISON:  None.  FINDINGS: The brain is atrophic with chronic microvascular ischemic change. No evidence of acute intracranial abnormality including hemorrhage, infarct, mass lesion, mass effect, midline shift or abnormal extra-axial fluid collections identified. No hydrocephalus or pneumocephalus. Calvarium intact.  IMPRESSION: No acute finding.   Atrophy and chronic microvascular ischemic change.   Electronically Signed   By: Drusilla Kanner M.D.   On: 01/28/2015 20:36   Mr Brain Wo Contrast  01/30/2015   CLINICAL DATA:  Altered mental status  EXAM: MRI HEAD WITHOUT CONTRAST  TECHNIQUE: Multiplanar, multiecho pulse sequences of the brain and surrounding structures were obtained without intravenous contrast.  COMPARISON:  CT head 01/28/2015  FINDINGS: Moderate atrophy. Negative for hydrocephalus. Pituitary normal in size.  Small acute infarct in the left parietal cortex measuring less than 1 cm. No other acute infarct.  Chronic microvascular ischemic changes in the white matter, mild to moderate in degree. Brainstem and cerebellum intact.  Negative for hemorrhage or fluid collection.  Negative for mass or edema.  No shift of the midline structures.  Paranasal sinuses clear.  IMPRESSION: Small acute infarct left parietal cortex likely in the precentral cortex.  Atrophy and chronic microvascular ischemia.   Electronically Signed   By: Marlan Palau M.D.   On: 01/30/2015 10:58    Scheduled Meds: . aspirin EC  81 mg Oral Daily  . calcitonin (salmon)  1 spray Alternating Nares Daily  . cholecalciferol   Oral q morning - 10a  . [START ON 01/31/2015] enoxaparin (LOVENOX) injection  30 mg Subcutaneous Q24H  . levothyroxine  75 mcg Oral QAC breakfast  . polycarbophil  625 mg Oral QHS  . sodium chloride  3 mL Intravenous Q12H  . timolol  1 drop Both Eyes QHS   Continuous Infusions:    Principal Problem:   CVA (cerebral infarction) Active Problems:   Hypothyroidism   Altered mental status   Hyponatremia    Time spent:    [facility]  [facility] Pager [phone]. If 7PM-7AM, please contact night-coverage at [email or web address], password [facility] 01/30/2015, 6:38 PM  LOS: 2 days

## 2015-01-30 NOTE — Progress Notes (Signed)
UR completed 

## 2015-01-30 NOTE — Progress Notes (Signed)
EEG completed; results pending.    

## 2015-01-31 ENCOUNTER — Inpatient Hospital Stay
Admission: RE | Admit: 2015-01-31 | Discharge: 2016-01-14 | Disposition: A | Discharge status: Still In Hospital | Payer: 59 | Source: Ambulatory Visit | Attending: Internal Medicine | Admitting: Internal Medicine

## 2015-01-31 ENCOUNTER — Inpatient Hospital Stay (HOSPITAL_COMMUNITY): Payer: 59

## 2015-01-31 DIAGNOSIS — R52 Pain, unspecified: Principal | ICD-10-CM

## 2015-01-31 DIAGNOSIS — I82401 Acute embolism and thrombosis of unspecified deep veins of right lower extremity: Secondary | ICD-10-CM

## 2015-01-31 DIAGNOSIS — R609 Edema, unspecified: Secondary | ICD-10-CM

## 2015-01-31 DIAGNOSIS — I6789 Other cerebrovascular disease: Secondary | ICD-10-CM

## 2015-01-31 LAB — HIV ANTIBODY (ROUTINE TESTING W REFLEX): HIV Screen 4th Generation wRfx: NONREACTIVE

## 2015-01-31 MED ORDER — STROKE: EARLY STAGES OF RECOVERY BOOK
Freq: Once | Status: AC
  Administered 2015-01-31: 16:00:00
  Filled 2015-01-31: qty 1

## 2015-01-31 MED ORDER — ASPIRIN 81 MG PO TBEC: 81.0000 mg | DELAYED_RELEASE_TABLET | Freq: Every day | ORAL | Status: DC

## 2015-01-31 MED ORDER — AMLODIPINE BESYLATE 5 MG PO TABS: 5.0000 mg | ORAL_TABLET | Freq: Every day | ORAL | Status: DC

## 2015-01-31 NOTE — Clinical Social Work Placement (Signed)
Clinical Social Work Department CLINICAL SOCIAL WORK PLACEMENT NOTE 01/31/2015  Patient:  Tina Lane,[name]  Account Number:  [id number] Admit date:  01/28/2015  Clinical Social Worker:  Derenda FennelKARA Stanely Sexson, LCSW  Date/time:  01/31/2015 02:18 PM  Clinical Social Work is seeking post-discharge placement for this patient at the following level of care:   SKILLED NURSING   (*CSW will update this form in Epic as items are completed)   01/31/2015  [facility]family provided with [facility] of Clinical Social Work's list of facilities offering this level of care within the geographic area requested by the patient (or if unable, by the patient's family).  01/31/2015  Patient/family informed of their freedom to choose among providers that offer the needed level of care, that participate in Medicare, Medicaid or managed care program needed by the patient, have an available bed and are willing to accept the patient.  01/31/2015  Patient/family informed of [facility]' ownership interest in [facility], as well as of the fact that they are under no obligation to receive care at this facility.  PASARR submitted to EDS on  PASARR number received on   FL2 transmitted to all facilities in geographic area requested by pt/family on  01/31/2015 FL2 transmitted to all facilities within larger geographic area on   Patient informed that his/her managed care company has contracts with or will negotiate with  certain facilities, including the following:     Patient/family informed of bed offers received:  01/31/2015 Patient [facility] bed [facility] Physician recommends and patient chooses bed at    [facility] on  01/31/2015 Patient to be transferred to facility by RN Patient and family notified of transfer on 01/31/2015 Name of family member notified:  Harrold Donathathan- son  The following physician request were entered in Epic:   Additional Comments: Pt  has existing pasarr.  Derenda FennelKara Joshue Badal, [place]CS[place] [phone]

## 2015-01-31 NOTE — Discharge Summary (Signed)
Physician Discharge Summary  Tina Lane NOM:[id number] DOB: [birth year] DOA: 01/28/2015  PCP: Leamon Arnt, MD  Admit date: 01/28/2015 Discharge date: 01/31/2015  Time spent: 40 minutes  Recommendations for Outpatient Follow-up:  1. Patient will be discharged to the [facility] for further care 2. Echocardiogram has been done and report will need to be followed up 3. EEG has been done and report will need to be followed up 4. RPR is in process, will need to be followed up  Discharge Diagnoses:  Principal Problem:   CVA (cerebral infarction) Active Problems:   Hypothyroidism   Altered mental status   Hyponatremia   Discharge Condition: stable  Diet recommendation: low salt  Filed Weights   01/29/15 0655 01/31/15 0614  Weight: 57.7 kg (127 lb 3.3 oz) 55.3 kg (121 lb 14.6 oz)    History of present illness:  This is a 81 year old female who is extremely hard of hearing, was found at home wandering in her yard. Her son reports that she had progressive worsening of her mental status for several days prior to admission. She did not have any fever, cough, shortness of breath or any other complaints. She was evaluated in the emergency room where CT scan of the head was negative for any acute process. She was admitted for further workup.  Hospital Course:  Workup for altered mental status including urinalysis and chest x-ray were unremarkable. Vitamin B12 was not low. TSH, ESR were found to be normal. RPR has been sent and is currently pending. MRI of the brain was done which indicated left parietal infarct. She underwent carotid Dopplers which did not indicate any evidence of significant stenosis at the carotid bifurcation on the left or right carotid system. It was felt that the right internal carotid artery was abnormal, with high resistance flow and essentially no antegrade blood flow during diastole suggesting a distal obstruction or significant stenosis of the distal ICA.  With patient's advanced age, she would certainly not be a candidate for any aggressive treatments such as vascular surgery. Recommendations are to this point are for medical management with aspirin. LDL was checked and was found to be in normal range and therefore she does not need a statin. Blood pressure has been mildly elevated. Of note, patient was recently started on hydrocodone which could be contributing to her confusion. At this time, she remains somewhat confused and does not know she is in the hospital. Hospital delirium could also be contributing to this. I believe she's gained maximum benefit from inpatient hospitalization and will discharge to a skilled nursing facility for further management.  Procedures:    Consultations:    Discharge Exam: Filed Vitals:   01/31/15 0614  BP: 160/77  Pulse: 95  Temp: 97.9 F (36.6 C)  Resp: 20    General: NAD, confused Cardiovascular: S1, S2 RRR Respiratory: CTA B  Discharge Instructions   Discharge Instructions    Diet - low sodium heart healthy    Complete by:  As directed      Increase activity slowly    Complete by:  As directed           Current Discharge Medication List    START taking these medications   Details  amLODipine (NORVASC) 5 MG tablet Take 1 tablet (5 mg total) by mouth daily. Qty: 30 tablet    aspirin EC 81 MG EC tablet Take 1 tablet (81 mg total) by mouth daily.      CONTINUE these medications which  have NOT CHANGED   Details  acetaminophen (TYLENOL) 325 MG tablet Take 2 tablets (650 mg total) by mouth every 6 (six) hours as needed for mild pain.    calcitonin, salmon, (MIACALCIN/FORTICAL) 200 UNIT/ACT nasal spray Place 1 spray into alternate nostrils daily.     calcium carbonate (TUMS EX) 750 MG chewable tablet Chew 1 tablet by mouth at bedtime as needed for heartburn.     Calcium Carbonate-Vitamin D (CALCIUM 600 + D PO) Take 1 tablet by mouth every morning.     Cholecalciferol (VITAMIN D3)  1000 UNITS CAPS Take 1 capsule by mouth every morning.     levothyroxine (SYNTHROID, LEVOTHROID) 75 MCG tablet Take 75 mcg by mouth daily before breakfast.    polycarbophil (FIBERCON) 625 MG tablet Take 625 mg by mouth at bedtime.    polyethylene glycol (MIRALAX / GLYCOLAX) packet Take 17 g by mouth daily as needed.    timolol (BETIMOL) 0.5 % ophthalmic solution Place 1 drop into both eyes at bedtime.        STOP taking these medications     diclofenac (VOLTAREN) 75 MG EC tablet      ibuprofen (ADVIL,MOTRIN) 200 MG tablet      HYDROcodone-acetaminophen (NORCO/VICODIN) 5-325 MG per tablet        Allergies  Allergen Reactions  . Latex Itching  . Onion Nausea And Vomiting  . Other     SPANDEX ALLERGY  . Penicillins Other (See Comments)    UNKNOWN REACTION  . Sulfa Antibiotics Other (See Comments)    UNKNOWN REACTION      The results of significant diagnostics from this hospitalization (including imaging, microbiology, ancillary and laboratory) are listed below for reference.    Significant Diagnostic Studies: Ct Abdomen Pelvis Wo Contrast  01/10/2015   CLINICAL DATA:  Abdominal pain.  No bowel movement for 1 week.  EXAM: CT ABDOMEN AND PELVIS WITHOUT CONTRAST  TECHNIQUE: Multidetector CT imaging of the abdomen and pelvis was performed following the standard protocol without IV contrast.  COMPARISON:  CT abdomen and pelvis 08/07/2011.  FINDINGS: There small bilateral pleural effusions and mild basilar atelectasis. Heart size is mildly enlarged. No pericardial effusion is noted.  Sigmoid diverticulosis without diverticulitis is identified. A large volume of stool is seen in the ascending and transverse colon. The stomach and small bowel appear normal.  Mild fullness of the right renal collecting system is unchanged. The left kidney appears normal. The gallbladder, liver, spleen and adrenal glands are unremarkable. The pancreas appears normal. Aortoiliac atherosclerosis without  aneurysm is identified. No lymphadenopathy or fluid collection is seen. Since the prior CT scan, since the prior exam, the patient has suffered a mild inferior endplate compression fracture T11. Remote inferior endplate compression fracture L2 is unchanged. No lytic or sclerotic bony lesion is identified.  IMPRESSION: No acute abnormality abdomen or pelvis.  Small bilateral pleural effusions.  Large volume of stool ascending and transverse colon.  Diverticulosis without diverticulitis.  Inferior endplate compression fracture of T11 is new since 2012 but age indeterminate.   Electronically Signed   By: Inge Rise M.D.   On: 01/10/2015 12:08   Dg Chest 1 View  01/28/2015   CLINICAL DATA:  Altered mental status, confusion  EXAM: CHEST - 1 VIEW  COMPARISON:  None.  FINDINGS: The patient is rotated towards the right. There is bilateral chronic interstitial thickening. There is no focal parenchymal opacity, pleural effusion, or pneumothorax. The heart and mediastinal contours are unremarkable.  There is calcific  tendinosis of bilateral rotator cuffs. There is no acute osseous abnormality.  IMPRESSION: No active disease.   Electronically Signed   By: Kathreen Devoid   On: 01/28/2015 20:43   Dg Thoracic Spine 2 View  01/05/2015   CLINICAL DATA:  Back pain.  EXAM: THORACIC SPINE - 2 VIEW  COMPARISON:  Chest x-ray dated 09/27/2014 and 07/16/2014  FINDINGS: There is no fracture. There is no bone destruction. There is marked accentuation of the thoracic kyphosis with diffuse osteopenia but the appearance is essentially unchanged since 07/16/2014. There is diffuse degenerative disc disease, also unchanged.  IMPRESSION: No acute abnormalities of the thoracic spine.   Electronically Signed   By: Rozetta Nunnery M.D.   On: 01/05/2015 17:02   Dg Lumbar Spine Complete  01/05/2015   CLINICAL DATA:  Back pain. Recent pelvic fracture in September 2015.  EXAM: LUMBAR SPINE - COMPLETE 4+ VIEW  COMPARISON:  Radiographs dated  08/07/2011 and CT scan of the abdomen and pelvis dated 08/07/2011  FINDINGS: There is diffuse osteopenia. There is an old compression fracture of the inferior endplate of L2. There is slight disc space narrowing at L3-4 and L4-5. There is no spondylolisthesis. There is a slight rotoscoliosis which is stable.  IMPRESSION: No acute abnormality of the lumbar spine.   Electronically Signed   By: Rozetta Nunnery M.D.   On: 01/05/2015 17:06   Ct Head Wo Contrast  01/28/2015   CLINICAL DATA:  Confusion for 2 hours every afternoon for 3 days.  EXAM: CT HEAD WITHOUT CONTRAST  TECHNIQUE: Contiguous axial images were obtained from the base of the skull through the vertex without intravenous contrast.  COMPARISON:  None.  FINDINGS: The brain is atrophic with chronic microvascular ischemic change. No evidence of acute intracranial abnormality including hemorrhage, infarct, mass lesion, mass effect, midline shift or abnormal extra-axial fluid collections identified. No hydrocephalus or pneumocephalus. Calvarium intact.  IMPRESSION: No acute finding.  Atrophy and chronic microvascular ischemic change.   Electronically Signed   By: Inge Rise M.D.   On: 01/28/2015 20:36   Mr Brain Wo Contrast  01/30/2015   CLINICAL DATA:  Altered mental status  EXAM: MRI HEAD WITHOUT CONTRAST  TECHNIQUE: Multiplanar, multiecho pulse sequences of the brain and surrounding structures were obtained without intravenous contrast.  COMPARISON:  CT head 01/28/2015  FINDINGS: Moderate atrophy. Negative for hydrocephalus. Pituitary normal in size.  Small acute infarct in the left parietal cortex measuring less than 1 cm. No other acute infarct.  Chronic microvascular ischemic changes in the white matter, mild to moderate in degree. Brainstem and cerebellum intact.  Negative for hemorrhage or fluid collection.  Negative for mass or edema.  No shift of the midline structures.  Paranasal sinuses clear.  IMPRESSION: Small acute infarct left parietal  cortex likely in the precentral cortex.  Atrophy and chronic microvascular ischemia.   Electronically Signed   By: Franchot Gallo M.D.   On: 01/30/2015 10:58   US Carotid Bilateral  01/31/2015   CLINICAL DATA:  One 81 year old female with a history of stroke  Cardiovascular risk factors include prior stroke/TIA. Negative diabetes and tobacco.  EXAM: BILATERAL CAROTID DUPLEX ULTRASOUND  TECHNIQUE: Pearline Cables scale imaging, color Doppler and duplex ultrasound were performed of bilateral carotid and vertebral arteries in the neck.  COMPARISON:  None  FINDINGS: Criteria: Quantification of carotid stenosis is based on velocity parameters that correlate the residual internal carotid diameter with NASCET-based stenosis levels, using the diameter of the distal internal  carotid lumen as the denominator for stenosis measurement.  The following velocity measurements were obtained:  RIGHT  ICA:  Systolic 97 cm/sec, Diastolic to cm/sec  CCA:  66 cm/sec  SYSTOLIC ICA/CCA RATIO:  1.5  ECA:  70 set cm/sec  LEFT  ICA:  Systolic 92 cm/sec, Diastolic 10 cm/sec  CCA:  79 cm/sec  SYSTOLIC ICA/CCA RATIO:  1.2  ECA:  120 cm/sec  RIGHT CAROTID ARTERY: No significant atherosclerotic changes of the right common carotid artery. Intermediate waveform maintained. Mild tortuosity. Mild heterogeneous and partially calcified plaque at the right carotid bulb into the right ICA. The waveform throughout the right ICA is high resistance, with essentially no or reversal of diastolic flow.  RIGHT VERTEBRAL ARTERY: Antegrade flow with low resistance waveform.  LEFT CAROTID ARTERY: Mild atherosclerotic changes of the left common carotid artery. Intermediate waveform maintained. Heterogeneous and partially calcified plaque at the left carotid bifurcation. Low resistance waveform of the left ICA with antegrade diastolic flow maintained.  LEFT VERTEBRAL ARTERY:  Antegrade flow with low resistance waveform.  IMPRESSION: Color duplex indicates minimal  heterogeneous and calcified plaque of the bilateral carotid arteries. No evidence by duplex criteria of a significant stenosis at the carotid bifurcation of the left or right carotid system.  The waveform of the right internal carotid artery is abnormal, with high resistance flow and essentially no antegrade blood flow during diastole. This is suggestive of a distal obstruction or significant stenosis of the distal ICA, potentially of the distal cervical segment, the petrous segment, or within the intracranial right ICA.  Signed,  Dulcy Fanny. Earleen Newport, DO  [facility]  [facility]   Electronically Signed   By: Corrie Mckusick D.O.   On: 01/31/2015 11:13   Dg Chest Port 1 View  01/10/2015   CLINICAL DATA:  Abdominal pain  EXAM: PORTABLE CHEST - 1 VIEW  COMPARISON:  None.  FINDINGS: There is bilateral chronic interstitial lung disease. There is a small right pleural effusion. There is no focal parenchymal opacity, pleural effusion, or pneumothorax. There is stable cardiomegaly.  The osseous structures are unremarkable.  IMPRESSION: Stable cardiomegaly with a small right pleural effusion. Bilateral diffuse chronic interstitial lung disease. Mild superimposed pulmonary edema is not excluded.   Electronically Signed   By: Kathreen Devoid   On: 01/10/2015 09:51    Microbiology: No results found for this or any previous visit (from the past 240 hour(s)).   Labs: Basic Metabolic Panel:  Recent Labs Lab 01/28/15 1938 01/30/15 0628  NA 132* 135  K 4.5 3.8  CL 96 101  CO2 28 27  GLUCOSE 104* 98  BUN 16 8  CREATININE 0.84 0.62  CALCIUM 8.9 8.8   Liver Function Tests:  Recent Labs Lab 01/28/15 1938  AST 21  ALT 15  ALKPHOS 89  BILITOT 0.6  PROT 7.2  ALBUMIN 3.6    Recent Labs Lab 01/28/15 1938  LIPASE 37   No results for input(s): AMMONIA in the last 168 hours. CBC:  Recent Labs Lab 01/28/15 1938  WBC 5.9  NEUTROABS 3.9  HGB 12.1  HCT 37.0   MCV 92.5  PLT 360   Cardiac Enzymes:  Recent Labs Lab 01/28/15 1938  TROPONINI <0.03   BNP: BNP (last 3 results) No results for input(s): BNP in the last 8760 hours.  ProBNP (last 3 results) No results for input(s): PROBNP in the last 8760 hours.  CBG: No results for input(s): GLUCAP in the last 168 hours.  Signed:  Loralee Weitzman  [facility] 01/31/2015, 3:10 PM

## 2015-01-31 NOTE — Evaluation (Signed)
Physical Therapy Evaluation Patient Details Name: Tina Lane MRN: [id number] DOB: [birth year] Today's Date: 01/31/2015   History of Present Illness  Ms. Tina Lane, a 81 year old female, who has a Past medical History of Glucoma, Thyroid Disease, GERD (Gastroesophageal reflux disease), HOH (Hard of Hearing). She was admitted with a small Left parietal Stroke. She is now being referred to  Physical therapy Evaluation.  Clinical Impression  Patient was seen in bed, alert but disoriented to situation and time. She has was able to follow directions and strength was WNL from Left to Right. She needed Minimal Assistance to Stand by assistance for transfers. Major problem was poor standing balance and fair sitting balance. Fall risks. Patient at times has a tendency to fall backward and slightly to the left in sitting but able to self correct. Fall tendency to the left during ambulation. Decreased to Poor endurance. Patient loss her balance during gait and PT has to stabilize her, narrow base of support, at times has scissoring gait when  changing directions. She will require a full time CG, if not available she will need to go to SNF.    Follow Up Recommendations Home health PT;SNF;Supervision/Assistance - 24 hour (To be determined)    Equipment Recommendations   (might need a wheelchair)    Recommendations for Other Services   none    Precautions / Restrictions Precautions Precautions: Fall Restrictions Weight Bearing Restrictions: No      Mobility  Bed Mobility Overal bed mobility: Needs Assistance Bed Mobility: Supine to Sit     Supine to sit: Min guard        Transfers Overall transfer level: Needs assistance Equipment used: Rolling walker (2 wheeled) Transfers: Sit to/from Stand Sit to Stand: Supervision            Ambulation/Gait Ambulation/Gait assistance: Mod assist Ambulation Distance (Feet): 50 Feet Assistive device: Rolling walker (2 wheeled) Gait  Pattern/deviations: Drifts right/left;Narrow base of support;Trunk flexed;Scissoring;Decreased stride length     General Gait Details: Scissoring at times most noticeable during turning and changing directions  Stairs            Wheelchair Mobility    Modified Rankin (Stroke Patients Only) Modified Rankin (Stroke Patients Only) Pre-Morbid Rankin Score: Slight disability Modified Rankin: Moderate disability     Balance Overall balance assessment: Needs assistance;History of Falls Sitting-balance support: No upper extremity supported;Feet supported Sitting balance-Leahy Scale: Fair Sitting balance - Comments: Falls backwards at times   Standing balance support: Bilateral upper extremity supported Standing balance-Leahy Scale: Poor                               Pertinent Vitals/Pain Pain Assessment: No/denies pain    Home Living Family/patient expects to be discharged to:: Unsure Living Arrangements: Children                    Prior Function Level of Independence: Independent with assistive device(s)               Hand Dominance   Dominant Hand: Right    Extremity/Trunk Assessment               Lower Extremity Assessment: Overall WFL for tasks assessed      Cervical / Trunk Assessment: Kyphotic  Communication   Communication: HOH  Cognition Arousal/Alertness: Awake/alert Behavior During Therapy: WFL for tasks assessed/performed;Impulsive Overall Cognitive Status: Impaired/Different from baseline Area of Impairment: Orientation  Orientation Level: Disoriented to;Situation;Time                  General Comments      Exercises        Assessment/Plan    PT Assessment Patient needs continued PT services  PT Diagnosis Abnormality of gait;Difficulty walking;Altered mental status (Deconditioned )   PT Problem List Decreased activity tolerance;Decreased balance;Decreased mobility;Decreased cognition  PT Treatment  Interventions Gait training;Functional mobility training;Therapeutic exercise;Balance training;Patient/family education   PT Goals (Current goals can be found in the Care Plan section) Acute Rehab PT Goals Patient Stated Goal: none PT Goal Formulation: Patient unable to participate in goal setting Time For Goal Achievement: 02/14/15 Potential to Achieve Goals: Fair    Frequency Min 3X/week   Barriers to discharge   none    Co-evaluation               End of Session Equipment Utilized During Treatment: Gait belt Activity Tolerance: Patient tolerated treatment well Patient left: in chair;with call bell/phone within reach;with chair alarm set;with nursing/sitter in room Nurse Communication: Mobility status;Precautions         Time: [id number] PT Time Calculation (min) (ACUTE ONLY): 29 min   Charges:   PT Evaluation $Initial PT Evaluation Tier I: 1 Procedure     PT G CodesMyrlene Broker:        Tina Lane L 01/31/2015, 9:27 AM

## 2015-01-31 NOTE — Clinical Social Work Placement (Signed)
Clinical Social Work Department CLINICAL SOCIAL WORK PLACEMENT NOTE 01/31/2015  Patient:  Tina Lane,Tina Lane  Account Number:  [id number] Admit date:  01/28/2015  Clinical Social Worker:  Derenda FennelKARA Charly Holcomb, LCSW  Date/time:  01/31/2015 02:18 PM  Clinical Social Work is seeking post-discharge placement for this patient at the following level of care:   SKILLED NURSING   (*CSW will update this form in Epic as items are completed)   01/31/2015  Patient/family provided with [facility] of Clinical Social Work's list of facilities offering this level of care within the geographic area requested by the patient (or if unable, by the patient's family).  01/31/2015  Patient/family informed of their freedom to choose among providers that offer the needed level of care, that participate in Medicare, Medicaid or managed care program needed by the patient, have an available bed and are willing to accept the patient.  01/31/2015  Patient/family informed of [facility]' ownership interest in [facility], as well as of the fact that they are under no obligation to receive care at this facility.  PASARR submitted to EDS on  PASARR number received on   FL2 transmitted to all facilities in geographic area requested by pt/family on  01/31/2015 FL2 transmitted to all facilities within larger geographic area on   Patient informed that his/her managed care company has contracts with or will negotiate with  certain facilities, including the following:     Patient/family informed of bed offers received:   Patient chooses bed at  Physician recommends and patient chooses bed at    Patient to be transferred to  on   Patient to be transferred to facility by  Patient and family notified of transfer on  Name of family member notified:    The following physician request were entered in Epic:   Additional Comments: Pt has existing pasarr.  Derenda FennelKara Fabyan Loughmiller, [place] [phone]

## 2015-01-31 NOTE — Procedures (Signed)
  [facility] Saabir Blyth A. Gerilyn Pilgrimoonquah, MD     [email or web address]           HISTORY: The patient is a 81 year old female who presents with altered mental status and confusion. Study is being done to evaluate for seizures.  MEDICATIONS: Scheduled Meds: Continuous Infusions: PRN Meds:.  Prior to Admission medications   Medication Sig Start Date End Date Taking? Authorizing Provider  acetaminophen (TYLENOL) 325 MG tablet Take 2 tablets (650 mg total) by mouth every 6 (six) hours as needed for mild pain. 09/29/14   Gwenyth BenderKaren M Black, NP  amLODipine (NORVASC) 5 MG tablet Take 1 tablet (5 mg total) by mouth daily. 01/31/15   Erick BlinksJehanzeb Memon, MD  aspirin EC 81 MG EC tablet Take 1 tablet (81 mg total) by mouth daily. 01/31/15   Erick BlinksJehanzeb Memon, MD  calcitonin, salmon, (MIACALCIN/FORTICAL) 200 UNIT/ACT nasal spray Place 1 spray into alternate nostrils daily.  01/24/15   Historical Provider, MD  calcium carbonate (TUMS EX) 750 MG chewable tablet Chew 1 tablet by mouth at bedtime as needed for heartburn.     Historical Provider, MD  Calcium Carbonate-Vitamin D (CALCIUM 600 + D PO) Take 1 tablet by mouth every morning.     Historical Provider, MD  Cholecalciferol (VITAMIN D3) 1000 UNITS CAPS Take 1 capsule by mouth every morning.     Historical Provider, MD  diclofenac (VOLTAREN) 75 MG EC tablet Take 75 mg by mouth 2 (two) times daily.    Historical Provider, MD  HYDROcodone-acetaminophen (NORCO/VICODIN) 5-325 MG per tablet Take 1 tablet by mouth every 4 (four) hours as needed for moderate pain. Patient not taking: Reported on 01/10/2015 01/05/15   Juliet RudeNathan R. Pickering, MD  ibuprofen (ADVIL,MOTRIN) 200 MG tablet Take 600 mg by mouth every 8 (eight) hours as needed for mild pain.     Historical Provider, MD  levothyroxine (SYNTHROID, LEVOTHROID) 75 MCG tablet Take 75 mcg by mouth daily before breakfast.    Historical Provider, MD  polycarbophil (FIBERCON) 625 MG tablet Take 625 mg by mouth at bedtime.     Historical Provider, MD  polyethylene glycol (MIRALAX / GLYCOLAX) packet Take 17 g by mouth daily as needed.    Historical Provider, MD  timolol (BETIMOL) 0.5 % ophthalmic solution Place 1 drop into both eyes at bedtime.      Historical Provider, MD      ANALYSIS: A 16 channel recording using standard 10 20 measurements is conducted for 24 minutes. There is a well-formed posterior dominant rhythm neck is as high as 10 Hz. There is beta activity observed in the frontal areas. Awake and drowsy activities are observed. Photic stimulation and hyperventilation were not carried out. There are no focal or lateral slowing. There is no epileptiform activity observed.   IMPRESSION: 1. This is a normal recording of the awake and drowsy states.      Tris Howell A. Gerilyn Pilgrimoonquah, M.D.  Diplomate, Biomedical engineerAmerican Board of Psychiatry and Neurology ( Neurology).

## 2015-01-31 NOTE — Care Management Note (Signed)
    Page 1 of 1   01/31/2015     3:06:40 PM CARE MANAGEMENT NOTE 01/31/2015  Patient:  Tina Lane,Tina Lane   Account Number:  [id number]  Date Initiated:  01/31/2015  Documentation initiated by:  Sharrie RothmanBLACKWELL,Avionna Bower C  Subjective/Objective Assessment:   Pt admitted from home with altered mental status. Pt has had a CVA.     Action/Plan:   PT recommends SNF. Pts son is agreeable. [facility]. CSW to arrange discharge to facility.   Anticipated DC Date:  01/31/2015   Anticipated DC Plan:  SKILLED NURSING FACILITY  In-house referral  Clinical Social Worker      DC Planning Services  CM consult      Choice offered to / List presented to:             Status of service:  Completed, signed off Medicare Important Message given?  YES (If response is "NO", the following Medicare IM given date fields will be blank) Date Medicare IM given:  01/31/2015 Medicare IM given by:  Sharrie RothmanBLACKWELL,Sefora Tietje C Date Additional Medicare IM given:   Additional Medicare IM given by:    Discharge Disposition:  SKILLED NURSING FACILITY  Per UR Regulation:    If discussed at Long Length of Stay Meetings, dates discussed:    Comments:  01/31/15 1505 Tina Queenammy Dwon Sky, RN BSN CM

## 2015-01-31 NOTE — Clinical Social Work Note (Signed)
[facility] will [facility] today. Son and facility aware and agreeable. Pt to transfer with RN. CSW will fax d/c summary upon completion.   Derenda FennelKara Ambriella Kitt, [place] [phone]

## 2015-01-31 NOTE — Progress Notes (Signed)
  Echocardiogram 2D Echocardiogram has been performed.  Tina Lane 01/31/2015, 5:25 PM

## 2015-01-31 NOTE — Clinical Social Work Psychosocial (Signed)
Clinical Social Work Department BRIEF PSYCHOSOCIAL ASSESSMENT 01/31/2015  Patient:  Tina Lane,Tina Lane     Account Number:  [id number]     Admit date:  01/28/2015  Clinical Social Worker:  Nancie NeasSTULTZ,Flordia Kassem, LCSW  Date/Time:  01/31/2015 02:22 PM  Referred by:  CSW  Date Referred:  01/31/2015 Referred for  SNF Placement   Other Referral:   Interview type:  Family Other interview type:   son- Tina Lane    PSYCHOSOCIAL DATA Living Status:  FAMILY Admitted from facility:   Level of care:   Primary support name:  Tina Lane Primary support relationship to patient:  CHILD, ADULT Degree of support available:   supportive    CURRENT CONCERNS Current Concerns  Post-Acute Placement   Other Concerns:    SOCIAL WORK ASSESSMENT / PLAN CSW spoke with pt's son, Tina Lane on phone as pt is oriented to self only. [facility] Tina Lane reports he is only child and has been handling everything on his own. [facility] for about 2 months. Pt did very well and went home alone. Tina Lane said this was okay for a few weeks, but then he felt someone needed to be with her. He arranged for a sitter to stay with her during the day and Tina Lane would spend the night. Pt ambulates with a walker at baseline. Tina Lane reports pt has been very confused since last Thursday. She was brought to ED for evaluation. Pt has had a stroke. Tina Lane is unsure what to expect. CSW provided brief support. Discussed d/c plan. Tina Lane feels pt needs to return to SNF. He is aware pt will be private pay as her insurance does not have SNF benefits. Requesting [facility] or [facility].   Assessment/plan status:  Psychosocial Support/Ongoing Assessment of Needs Other assessment/ plan:   Information/referral to community resources:   SNF list    PATIENT'S/FAMILY'S RESPONSE TO PLAN OF CARE: CSW initiated bed search and will follow up.       Derenda FennelKara Chloris Marcoux, [place]CS[place] [phone]

## 2015-02-01 ENCOUNTER — Non-Acute Institutional Stay: Payer: 59 | Admitting: Internal Medicine

## 2015-02-01 DIAGNOSIS — F05 Delirium due to known physiological condition: Secondary | ICD-10-CM

## 2015-02-01 DIAGNOSIS — E039 Hypothyroidism, unspecified: Secondary | ICD-10-CM

## 2015-02-01 DIAGNOSIS — R410 Disorientation, unspecified: Secondary | ICD-10-CM

## 2015-02-01 DIAGNOSIS — E871 Hypo-osmolality and hyponatremia: Secondary | ICD-10-CM

## 2015-02-01 LAB — HEMOGLOBIN A1C
HEMOGLOBIN A1C: 5.8 % — AB (ref 4.8–5.6)
MEAN PLASMA GLUCOSE: 120 mg/dL

## 2015-02-02 LAB — RPR: RPR: NONREACTIVE

## 2015-02-04 NOTE — Progress Notes (Addendum)
Patient ID: Tina Lane, female   DOB: 1912-06-27, 81 y.o.   MRN: [id number]               HISTORY & PHYSICAL  DATE:  02/01/2015                   FACILITY: [facility]      LEVEL OF CARE:   Assisted living   CHIEF COMPLAINT:  Admission to SNF, post stay at [facility], 01/28/2015 through 01/31/2015.  She actually is in an assisted living bed here.  She apparently will be private pay here.  She has no Medicare benefits.    HISTORY OF PRESENT ILLNESS:  This is a patient who is 81 years old.  We actually had her in the facility in October, at which time she had fallen and fractured her right inferior and superior pubic rami.  She lives on her own in [place].  She apparently went home and had somebody staying with her up until about 2-3 weeks ago and then she was alone.    She was apparently found wandering at home in her yard.  She apparently had had a worsening of her mental status for several days.  She did not have evidence of systemic infection.    LABS/RADIOLOGY:  Work-up for her altered mental status was extensive and included vitamin B12, TSH, sedimentation rate, RPR, MRI of the brain which indicated a left parietal infarct.    She had arterial dopplers of her carotids that did not show any significant stenosis on the left.  On the right, this was abnormal although it was not felt that she would be a vascular surgery candidate.    CT scan of the abdomen and pelvis was done on 01/10/2015 that showed endplate compression fracture of L2.  There were no lytic or sclerotic lesions.    Chest x-ray showed no active disease.    Thoracic spine showed diffuse degenerative disc disease.    Lumbar spine showed no acute abnormality.    As noted, her MRI showed a small acute infarct in the left parietal cortex.    Review showed a slightly low sodium.    A comprehensive metabolic panel was normal.    Her albumin was normal at 3.6.    Liver function tests were normal.     White count was 5.9, hemoglobin 12.1.    Troponin-I was less than 0.3.               PAST MEDICAL HISTORY/PROBLEM LIST:                        Glaucoma.    Thyroid disease.     Hard of hearing.    Gastroesophageal reflux disease.    Pelvic fractures, as mentioned.    Chronic back pain.     PAST SURGICAL HISTORY:                        Hemorrhoid surgery.     Cataract extraction.    CURRENT MEDICATIONS:  Medication list is reviewed.                    Tylenol 650 q.6 p.r.n.           Miacalcin 1 spray, alternating nostrils daily.    Calcium carbonate 750, 1 tablet at bedtime as needed.    Calcium carbonate plus D, 1 tablet every morning.  Vitamin D3, 1000 daily.     Synthroid 75 q.d.      Fibercon 625 at bedtime.    MiraLAX 17 g daily p.r.n.      Timolol 0.5% ophthalmic, 1 drop into both eyes at bedtime.    She was taken off diclofenac, ibuprofen, and Norco.     SOCIAL HISTORY:    I do not have a complete picture of this.  Prior to October, this was an independent woman living in her own home in [place].  She went home, had assistance at home until about 2-3 weeks ago.  Again, her exact functional status until she deteriorated, which according to the history was shortly before she was admitted to hospital, is uncertain.    REVIEW OF SYSTEMS:  Not possible.              PHYSICAL EXAMINATION:       GENERAL APPEARANCE:  The patient is conscious.  However, she keeps her eyes closed.  Will not open them and will not engage me in conversation.   HEENT:   EYES:  Her eyes look normal.   I wonder about her visual acuity.   MOUTH/THROAT:   Oral exam reveals somewhat dry mucosa.   CHEST/RESPIRATORY:  Significant kyphosis.  Her air entry is normal.   CARDIOVASCULAR:  CARDIAC:   Heart sounds are normal.  She appears to be somewhat volume-contracted.    GASTROINTESTINAL:  ABDOMEN:   Diffusely "touchy".  No masses.   LIVER/SPLEEN/KIDNEYS:  No liver, no spleen.    GENITOURINARY:  BLADDER:   Not enlarged.  There is no CVA tenderness.   CIRCULATION:   EDEMA/VARICOSITIES:  Extremities:  She has significant venous stasis, although I see no open wounds.   VASCULAR:   ARTERIAL:  Probably some degree of PAD but, again, no open areas.   NEUROLOGICAL:   I could not really test her.  She would not respond to my questions and would barely open her eyes.    ASSESSMENT/PLAN:                   Delirium.  This seems most likely, although the exact cause of this is not clear.  She had a slightly low sodium on presentation at 132.  This is not enough to really account for anything, and it was 135 at discharge.  The rest of her lab work looks essentially unremarkable.  Hemoglobin A1c was 5.8.  TSH was 1.34.  Sed rate was 12.  Neuroimaging as noted.  In terms of differential here, I would wonder about depression.  I am going to take a couple of days to see how she settles into the building.  Eating, drinking, continence will be all important things to observe in making this distinction.  If this is a delirium, the source of this is not clear.  She is not on any of the usual medication culprits.    Hypothyroidism.  On replacement.    This was checked in the hospital and was normal.  She did have an acute stroke, although I am not sure that this in itself accounts for the etiology of her current presentation, either.    I am going to wait to see how this goes over the next two days or so.  Careful observation of her functional level will be important.  She apparently had three falls overnight, though.  CLINICAL DATA:  Altered mental status   EXAM: MRI HEAD WITHOUT CONTRAST   TECHNIQUE: Multiplanar, multiecho pulse sequences of the brain and surrounding structures were obtained without intravenous contrast.   COMPARISON:  CT head 01/28/2015   FINDINGS: Moderate atrophy. Negative for hydrocephalus. Pituitary normal in size.   Small acute  infarct in the left parietal cortex measuring less than 1 cm. No other acute infarct.   Chronic microvascular ischemic changes in the white matter, mild to moderate in degree. Brainstem and cerebellum intact.   Negative for hemorrhage or fluid collection.   Negative for mass or edema.  No shift of the midline structures.   Paranasal sinuses clear.   IMPRESSION: Small acute infarct left parietal cortex likely in the precentral cortex.   Atrophy and chronic microvascular ischemia.     Electronically Signed   By: Marlan Palauharles  Clark M.D.   On: 01/30/2015 10:58

## 2015-02-05 ENCOUNTER — Encounter (HOSPITAL_COMMUNITY)
Admission: RE | Admit: 2015-02-05 | Discharge: 2015-02-05 | Disposition: A | Discharge status: Home / Self Care | Payer: 59 | Source: Skilled Nursing Facility | Attending: Internal Medicine | Admitting: Internal Medicine

## 2015-02-05 DIAGNOSIS — E039 Hypothyroidism, unspecified: Secondary | ICD-10-CM | POA: Insufficient documentation

## 2015-02-05 DIAGNOSIS — E871 Hypo-osmolality and hyponatremia: Secondary | ICD-10-CM | POA: Insufficient documentation

## 2015-02-06 ENCOUNTER — Encounter (HOSPITAL_COMMUNITY)
Admission: RE | Admit: 2015-02-06 | Discharge: 2015-02-06 | Disposition: A | Discharge status: Home / Self Care | Payer: 59 | Source: Skilled Nursing Facility | Attending: Internal Medicine | Admitting: Internal Medicine

## 2015-02-06 DIAGNOSIS — E871 Hypo-osmolality and hyponatremia: Secondary | ICD-10-CM | POA: Diagnosis present

## 2015-02-06 DIAGNOSIS — E039 Hypothyroidism, unspecified: Secondary | ICD-10-CM | POA: Diagnosis not present

## 2015-02-06 LAB — BASIC METABOLIC PANEL
Anion gap: 5 (ref 5–15)
BUN: 28 mg/dL — AB (ref 6–23)
CALCIUM: 9.2 mg/dL (ref 8.4–10.5)
CO2: 32 mmol/L (ref 19–32)
CREATININE: 0.72 mg/dL (ref 0.50–1.10)
Chloride: 101 mmol/L (ref 96–112)
GFR calc Af Amer: 79 mL/min — ABNORMAL LOW (ref >90)
GFR calc non Af Amer: 68 mL/min — ABNORMAL LOW (ref >90)
Glucose, Bld: 95 mg/dL (ref 70–99)
POTASSIUM: 3.8 mmol/L (ref 3.5–5.1)
SODIUM: 138 mmol/L (ref 135–145)

## 2015-08-04 ENCOUNTER — Ambulatory Visit (HOSPITAL_COMMUNITY): Payer: 59

## 2015-08-04 ENCOUNTER — Encounter (HOSPITAL_COMMUNITY)
Admission: RE | Admit: 2015-08-04 | Discharge: 2015-08-04 | Disposition: A | Discharge status: Home / Self Care | Payer: 59 | Source: Skilled Nursing Facility | Attending: Internal Medicine | Admitting: Internal Medicine

## 2015-08-04 ENCOUNTER — Non-Acute Institutional Stay (SKILLED_NURSING_FACILITY): Payer: 59 | Admitting: Internal Medicine

## 2015-08-04 ENCOUNTER — Ambulatory Visit (HOSPITAL_COMMUNITY): Payer: 59 | Attending: Internal Medicine

## 2015-08-04 DIAGNOSIS — K219 Gastro-esophageal reflux disease without esophagitis: Secondary | ICD-10-CM | POA: Diagnosis not present

## 2015-08-04 DIAGNOSIS — S329XXD Fracture of unspecified parts of lumbosacral spine and pelvis, subsequent encounter for fracture with routine healing: Secondary | ICD-10-CM

## 2015-08-04 DIAGNOSIS — M7989 Other specified soft tissue disorders: Secondary | ICD-10-CM | POA: Insufficient documentation

## 2015-08-04 DIAGNOSIS — R0789 Other chest pain: Secondary | ICD-10-CM | POA: Diagnosis not present

## 2015-08-04 DIAGNOSIS — E038 Other specified hypothyroidism: Secondary | ICD-10-CM | POA: Diagnosis not present

## 2015-08-04 DIAGNOSIS — I10 Essential (primary) hypertension: Secondary | ICD-10-CM | POA: Diagnosis not present

## 2015-08-04 DIAGNOSIS — M810 Age-related osteoporosis without current pathological fracture: Secondary | ICD-10-CM | POA: Diagnosis not present

## 2015-08-04 LAB — CBC WITH DIFFERENTIAL/PLATELET
BASOS ABS: 0.1 10*3/uL (ref 0.0–0.1)
Basophils Relative: 1 % (ref 0–1)
EOS PCT: 4 % (ref 0–5)
Eosinophils Absolute: 0.3 10*3/uL (ref 0.0–0.7)
HCT: 42.2 % (ref 36.0–46.0)
HEMOGLOBIN: 13.9 g/dL (ref 12.0–15.0)
LYMPHS ABS: 2 10*3/uL (ref 0.7–4.0)
LYMPHS PCT: 28 % (ref 12–46)
MCH: 29.9 pg (ref 26.0–34.0)
MCHC: 32.9 g/dL (ref 30.0–36.0)
MCV: 90.8 fL (ref 78.0–100.0)
MONOS PCT: 13 % — AB (ref 3–12)
Monocytes Absolute: 0.9 10*3/uL (ref 0.1–1.0)
NEUTROS ABS: 3.9 10*3/uL (ref 1.7–7.7)
Neutrophils Relative %: 54 % (ref 43–77)
Platelets: 339 10*3/uL (ref 150–400)
RBC: 4.65 MIL/uL (ref 3.87–5.11)
RDW: 14.5 % (ref 11.5–15.5)
WBC: 7.2 10*3/uL (ref 4.0–10.5)

## 2015-08-04 LAB — COMPREHENSIVE METABOLIC PANEL
ALBUMIN: 3.8 g/dL (ref 3.5–5.0)
ALK PHOS: 60 U/L (ref 38–126)
ALT: 10 U/L — ABNORMAL LOW (ref 14–54)
AST: 16 U/L (ref 15–41)
Anion gap: 8 (ref 5–15)
BUN: 26 mg/dL — AB (ref 6–20)
CO2: 29 mmol/L (ref 22–32)
Calcium: 9.4 mg/dL (ref 8.9–10.3)
Chloride: 95 mmol/L — ABNORMAL LOW (ref 101–111)
Creatinine, Ser: 0.89 mg/dL (ref 0.44–1.00)
GFR, EST AFRICAN AMERICAN: 59 mL/min — AB (ref >60)
GFR, EST NON AFRICAN AMERICAN: 51 mL/min — AB (ref >60)
GLUCOSE: 102 mg/dL — AB (ref 65–99)
Potassium: 4.6 mmol/L (ref 3.5–5.1)
Sodium: 132 mmol/L — ABNORMAL LOW (ref 135–145)
Total Bilirubin: 0.3 mg/dL (ref 0.3–1.2)
Total Protein: 7.5 g/dL (ref 6.5–8.1)

## 2015-08-04 LAB — TSH: TSH: 2.414 u[IU]/mL (ref 0.350–4.500)

## 2015-08-07 ENCOUNTER — Inpatient Hospital Stay (HOSPITAL_COMMUNITY)
Admit: 2015-08-07 | Discharge: 2015-08-07 | Disposition: A | Discharge status: Home / Self Care | Payer: 59 | Attending: Internal Medicine | Admitting: Internal Medicine

## 2015-08-08 LAB — VITAMIN D 1,25 DIHYDROXY
Vitamin D 1, 25 (OH)2 Total: 48 pg/mL
Vitamin D3 1, 25 (OH)2: 48 pg/mL

## 2015-08-30 ENCOUNTER — Encounter: Payer: Self-pay | Admitting: Internal Medicine

## 2015-08-30 NOTE — Progress Notes (Signed)
This encounter was created in error - please disregard.

## 2015-08-30 NOTE — Progress Notes (Signed)
Patient ID: Tina Lane, female   DOB: Jul 04, 1912, 81 y.o.   MRN: [id number]              L  DATE:  08/04/2015                   FACILITY: [facility]      LEVEL OF CARE:  Skilled  This is an acute-routine visit   CHIEF COMPLAINT: Acute visit secondary to chest pain-medical management of chronic issues including pelvic fracture hypothyroidism osteoporosis hypertension .    HISTORY OF PRESENT ILLNESS:  This is a patient who is 81 years old.  We actually had her in the facility in October, at which time she had fallen and fractured her right inferior and superior pubic rami.  She lives on her own in [place].  She apparently went home and had somebody staying with her for a while...    She was apparently found wandering at home in her yard.  She apparently had had a worsening of her mental status for several days.  She did not have evidence of systemic infection.    LABS/RADIOLOGY:  Work-up for her altered mental status was extensive and included vitamin B12, TSH, sedimentation rate, RPR, MRI of the brain which indicated a left parietal infarct.    She had arterial dopplers of her carotids that did not show any significant stenosis on the left.  On the right, this was abnormal although it was not felt that she would be a vascular surgery candidate.    CT scan of the abdomen and pelvis was done on 01/10/2015 that showed endplate compression fracture of L2.  There were no lytic or sclerotic lesions.     Thoracic spine showed diffuse degenerative disc disease.    Lumbar spine showed no acute abnormality.    As noted, her MRI showed a small acute infarct in the left parietal cortex.    Review showed a slightly low sodium.    Her delirium resolved and she was apparently placed here for long-term placement-her stay here has been quite unremarkable she is pleasant appropriate nursing staff does not report any issues.  However apparently earlier today she did complain of  some midsternal chest pain-she apparently received I believe Maalox with relief I am following up on this She tells me she gets this only when eating shortly after eating and is relieved by an antiacid--she denies any associated dizziness shortness of breath or radiation of the pain.  Currently she has no complaints she is bright alert pleasant which is her norm while signs are stable I do note her blood pressure somewhat elevated at 168/79-I do not see a whole lot a readings but this will have to be monitored she is on Norvasc 5 mg a day.                    PAST MEDICAL HISTORY/PROBLEM LIST:                        Glaucoma.    Thyroid disease.     Hard of hearing.    Gastroesophageal reflux disease.    Pelvic fractures, as mentioned.    Chronic back pain.     PAST SURGICAL HISTORY:                        Hemorrhoid surgery.     Cataract extraction.    CURRENT  MEDICATIONS:  Medication list is reviewed.                    Tylenol 650 q.6 p.r.n.           Miacalcin 1 spray, alternating nostrils daily.    Calcium carbonate 750, 1 tablet at bedtime as needed.    Calcium carbonate plus D, 1 tablet every morning.    Vitamin D3, 1000 daily.     Synthroid 75 q.d.      Fibercon 625 at bedtime.    MiraLAX 17 g daily p.r.n.      Timolol 0.5% ophthalmic, 1 drop into both eyes at bedtime.    She was taken off diclofenac, ibuprofen, and Norco in the hospital.     SOCIAL HISTORY:    I.  Prior to October, this was an independent woman living in her own home in [place].  She went home, had assistance at home until about 2-3 weeks ago.  Again, her exact functional status until she deteriorated, which according to the history was shortly before she was admitted to hospital, is uncertain.    REVIEW OF SYSTEMS:  Obtained from nursing staff and patient.  In general does not complain of any fever or chills.  Skin is not complaining of rashes or itching has numerous widespread  solar induced changes however.  Head ears eyes nose mouth and throat does not complain of any visual changes sore throat or dysphagia.  Respiratory denies any shortness breath or cough.  Cardiac no chest pain has some mild right lower leg edema.  GI-does not complaining of abdominal pain had some midsternal discomfort while and after eating which apparently happens occasionally relieved by an antacid  GU does not complain of dysuria.  Muscle skeletal does ambulate in a wheelchair is not complaining of joint pain.  Neurologic does not complain of dizziness headache or syncopal-type feelings.  In psych is pleasant apparently confusion at times what is pleasant and appropriate today on exam.  Physical exam.  Temp is 97.2 pulse 94 respirations 24 blood pressure 168/79.  In general this is a pleasant elderly female in no distress lying comfortably in bed.  Her skin is warm and dry has numerous solar induced changes most prominent over her arms and face.  Eyes pupils appear reactive light sclera and conjunctiva are clear visual acuity appears grossly intact.  Oropharynx is clear mucous membranes moist.  Chest is clear to auscultation there is no labored breathing.  Heart is regular rate and rhythm without murmur gallop or rub she has some mild right mid leg edema this is nontender and nonerythematous there is a positive pedal pulses.  Abdomen is soft nontender there are active bowel sounds.  Muscle skeletal does move all extremities 4 largely ambulates in a wheelchair during the day I do not note any deformities.  Neurologic is grossly intact no lateralizing findings her speech is clear.  Psych she is alert and grossly oriented apparently she does have some periods of confusion but she is pleasant and appropriate follow verbal commands without difficulty  Labs.  02/06/2015.  Sodium 138 potassium 3.8 BUN 28 creatinine 0.72.  01/30/2015.  Hemoglobin A1c 5.8.  Cholesterol  105-triglycerides 78-HDL 44-LDL 45.  01/29/2015 TSH was 1.344.  Albumin 3.6.  01/28/2015.  WBC 5.9 hemoglobin 12.1 platelets 360         ASSESSMENT/PLAN:                   #1-chest pain-this  appears to be more likely GI in origin one would suspect acid reflux GERD with the symptoms she is presenting with during and after eating-will help her on a proton pump inhibitor and monitor.  #2 history of left leg edema-will order a venous Doppler to rule out a DVT.--Also will obtain an x-ray  #3 history CVA she is on aspirin appears to have fairly minimal deficits here she has actually done quite well here the initial delirium appears to have resolved fairly quickly and has not returned since shortly after her admission.  Hypothyroidism she is on Synthroid we'll update a TSH TSH in the hospital was within normal limits she is on Synthroid.  #5 history of hypertension-systolic is elevated today would like to get more readings monitor vital signs pulse ox every shift 72 hours to give [place] a little idea where her vital signs run she is currently on Norvasc 5 mg a day.  #6 as history of pelvic fracture this appears to have healed unremarkably she does have osteoporosis she is on calcium supplementation.  #7 history constipation apparently this has not been an issue she is on MiraLAX.  Clinically she appears to be stable actually is done quite well-she will have to be monitored for any recurrence of chest pain although from history again this appears to be unlikely cardiac in origin-also monitor her blood pressures will await results of the studies on her right leg.  Also will  update lab work including CBC and metabolic panel I do note some history of hyponatremia on her initial presentation  to the hospital again will check TSH as well  [id number]-of note more than 40 minutes spent assessing patient-extensive review of her chart-and labs-discussing her status with nursing staff-and coordinating  and formulating a plan of care for numerous diagnoses-of note greater than 50% of time spent coordinating plan of care   .                     CLINICAL DATA:  Altered mental status   EXAM: MRI HEAD WITHOUT CONTRAST   TECHNIQUE: Multiplanar, multiecho pulse sequences of the brain and surrounding structures were obtained without intravenous contrast.   COMPARISON:  CT head 01/28/2015   FINDINGS: Moderate atrophy. Negative for hydrocephalus. Pituitary normal in size.   Small acute infarct in the left parietal cortex measuring less than 1 cm. No other acute infarct.   Chronic microvascular ischemic changes in the white matter, mild to moderate in degree. Brainstem and cerebellum intact.   Negative for hemorrhage or fluid collection.   Negative for mass or edema.  No shift of the midline structures.   Paranasal sinuses clear.   IMPRESSION: Small acute infarct left parietal cortex likely in the precentral cortex.   Atrophy and chronic microvascular ischemia.     Electronically Signed   By: Marlan Palau M.D.   On: 01/30/2015 10:58

## 2015-09-18 ENCOUNTER — Non-Acute Institutional Stay: Payer: 59 | Admitting: Internal Medicine

## 2015-09-18 DIAGNOSIS — S81801A Unspecified open wound, right lower leg, initial encounter: Secondary | ICD-10-CM | POA: Diagnosis not present

## 2015-09-18 DIAGNOSIS — S81811A Laceration without foreign body, right lower leg, initial encounter: Secondary | ICD-10-CM

## 2015-09-22 ENCOUNTER — Encounter: Payer: Self-pay | Admitting: Internal Medicine

## 2015-09-23 NOTE — Progress Notes (Addendum)
Patient ID: Tina Lane, female   DOB: 09-Dec-1912, 81 y.o.   MRN: [id number]                PROGRESS NOTE  DATE:  09/18/2015          FACILITY: [facility]                  LEVEL OF CARE:   AL                 Acute Visit              CHIEF COMPLAINT:  Fall with lower extremity wounds.     HISTORY OF PRESENT ILLNESS:  This is a patient who initially came to [place] in October 2015.  She fell, suffered multiple pelvic fractures on the right side.  She came here and I think ultimately was not felt to be able to go home and transitioned to the assisted living level of the facility.    She had a fall yesterday.  She has some abrasions on her right leg that I was asked to look at.    REVIEW OF SYSTEMS:    GENERAL:  Miraculously, the patient is able to give her own history.  She seems to have hearing loss, but her communication is quite functional.   CHEST/RESPIRATORY:  She appears to be short of breath, but denies this.  No coughing.       CARDIAC:  No chest pain.   GI:  No abdominal pain.    GU:  No dysuria.    MSK; no hip pain. No back pain CNS: no focal weakness.  PHYSICAL EXAMINATION:   VITAL SIGNS:     PULSE:  92.    RESPIRATIONS:  20.    02 SATURATIONS:  93% on room air.    GENERAL APPEARANCE:  The patient is not in any distress.           CHEST/RESPIRATORY:  Marked thoracic kyphosis, but otherwise clear.       CARDIOVASCULAR:   CARDIAC:  Heart sounds are normal.  She appears to be euvolemic.       GASTROINTESTINAL:   ABDOMEN:  Slightly distended.  Bowel sounds are positive.   No masses.    CIRCULATION:   EDEMA/VARICOSITIES:  Extremities:  She has significant venous stasis.     SKIN:   INSPECTION:  She has a small hematoma on the anterior right leg and a second wound just superior to this.     ASSESSMENT/PLAN:                  Skin tear secondary to a fall.  There is a small hematoma here.   I think this should be left alone to see if this will resorb.   I  think the Xeroform they had on there is reasonable.   I do not think she has significant PAD.   I think Kerlix/light Coban wrap would be reasonable.

## 2015-10-04 ENCOUNTER — Non-Acute Institutional Stay (SKILLED_NURSING_FACILITY): Payer: 59 | Admitting: Internal Medicine

## 2015-10-04 DIAGNOSIS — L089 Local infection of the skin and subcutaneous tissue, unspecified: Secondary | ICD-10-CM

## 2015-10-04 DIAGNOSIS — L98491 Non-pressure chronic ulcer of skin of other sites limited to breakdown of skin: Secondary | ICD-10-CM

## 2015-10-04 NOTE — Progress Notes (Signed)
Patient ID: Tina Lane, female   DOB: 03-Feb-1912, 81 y.o.   MRN: [id number] Facility; [facility] Chief complaint; increasing erythema pain around the right leg wound History; this is a patient I saw 2 weeks ago she developed a skin tear and a small hematoma on the anterior part of her right leg. The facility use Xeroform which was appropriate with a Kerlix wrap. Apparently the hematoma opened last week. She is now developed some degree of erythema and tenderness around this area she probably has coexistent venous stasis there for the degree of erythema here is a bit difficult to interpret however there is a fair amount of tenderness. She is not febrile and does not look systemically unwell.  On examination; Right leg; there is a wound with some surface eschar here roughly the size of a $0.50 piece. Patient probably has some degree of venous insufficiency and inflammation with chronic erythema however there is tenderness around the wound which is really quite significant she is going to need antibiotics.  Impression/plan #1 cellulitis of the right leg. I've started her on doxycycline. Change the dressing to silver alginate to. This will need to be changed daily. I will need to debridement the service of the wound next week. Careful follow-up.

## 2015-10-16 ENCOUNTER — Non-Acute Institutional Stay (SKILLED_NURSING_FACILITY): Payer: 59 | Admitting: Internal Medicine

## 2015-10-16 DIAGNOSIS — L98491 Non-pressure chronic ulcer of skin of other sites limited to breakdown of skin: Secondary | ICD-10-CM | POA: Diagnosis not present

## 2015-10-16 DIAGNOSIS — L089 Local infection of the skin and subcutaneous tissue, unspecified: Secondary | ICD-10-CM

## 2015-10-16 NOTE — Progress Notes (Signed)
Patient ID: Tina Lane, female   DOB: [birth year], [age] y.o.   MRN: [id number] Facility; [facility] Chief complaint; we reviewed wound over the right anterior leg. History; this patient had a fall almost a month ago. She had a hematoma as well as a skin tear of my memory serves me correctly. I last saw this 2 weeks ago at which time I felt she possibly had coexistent sialitis. I gave her a course of doxycycline for 7 days changed her dressing to Silver alginate. Apparently this was felt to be deteriorating over the weekend in that she had pain in the area and also an odor. I was asked to look at this again today.  Physical examination Skin; she has the now circular area on the right anterior leg. This has surface slough. She has severe surrounding venous insufficiency and inflammation with some degree of redness although I don't believe this is an actual infection.  Impression/plan #1 right anterior leg wound in the setting of venous stasis physiology venous inflammation with ulceration. This is going to require a selective debridement with a curet to remove the surface slough. She has enough edema here to warrant a wrap on this. I don't believe there is any evidence of infection.

## 2015-11-06 ENCOUNTER — Non-Acute Institutional Stay (SKILLED_NURSING_FACILITY): Payer: 59 | Admitting: Internal Medicine

## 2015-11-06 DIAGNOSIS — L089 Local infection of the skin and subcutaneous tissue, unspecified: Secondary | ICD-10-CM

## 2015-11-06 DIAGNOSIS — L98491 Non-pressure chronic ulcer of skin of other sites limited to breakdown of skin: Secondary | ICD-10-CM | POA: Diagnosis not present

## 2015-11-11 NOTE — Progress Notes (Signed)
Patient ID: Tina Lane, female   DOB: [birth year], [age] y.o.   MRN: [id number]                PROGRESS NOTE  DATE:  11/06/2015          FACILITY: [facility]                     LEVEL OF CARE:   SNF   Acute Visit               CHIEF COMPLAINT:  Review of right anterior leg wound.     HISTORY OF PRESENT ILLNESS:  This is a patient who fell about six weeks ago.  She has a hematoma and a skin tear.  I had thought that she might have cellulitis shortly after I saw this.  I gave her a course of doxycycline.    She continues to have a painful wound over the right anterior leg and I have been asked to have another look at this.    PHYSICAL EXAMINATION:   SKIN:   INSPECTION:   I think the wound is actually smaller, although there is adherent fibrous slough.  She has surrounding severe venous insufficiency and inflammation, and it is very tender.  It is difficult to determine whether she has coexistent cellulitis or not.    ASSESSMENT/PLAN:               Right anterior leg wound in the setting of venous stasis physiology, venous inflammation.  Whether she has coexistent cellulitis or not is hard to determine.  She is using Santyl and I think the Santyl is appropriate.  I am going to give her empiric antibiotics, probably with doxycycline once again.      CPT CODE: [id number]

## 2015-12-13 ENCOUNTER — Non-Acute Institutional Stay (SKILLED_NURSING_FACILITY): Payer: 59 | Admitting: Internal Medicine

## 2015-12-13 DIAGNOSIS — I8312 Varicose veins of left lower extremity with inflammation: Secondary | ICD-10-CM | POA: Diagnosis not present

## 2015-12-13 DIAGNOSIS — M810 Age-related osteoporosis without current pathological fracture: Secondary | ICD-10-CM | POA: Diagnosis not present

## 2015-12-13 DIAGNOSIS — I872 Venous insufficiency (chronic) (peripheral): Secondary | ICD-10-CM

## 2015-12-13 DIAGNOSIS — I8311 Varicose veins of right lower extremity with inflammation: Secondary | ICD-10-CM | POA: Diagnosis not present

## 2015-12-13 DIAGNOSIS — K219 Gastro-esophageal reflux disease without esophagitis: Secondary | ICD-10-CM | POA: Diagnosis not present

## 2015-12-13 DIAGNOSIS — E038 Other specified hypothyroidism: Secondary | ICD-10-CM | POA: Diagnosis not present

## 2015-12-13 NOTE — Progress Notes (Signed)
Patient ID: CLYDEAN POSAS, female   DOB: 04-Feb-1912, 81 y.o.   MRN: [id number]               L  DATE:  12/13/2015                   FACILITY: [facility]      LEVEL OF CARE:  Skilled  This is a routine visit   CHIEF COMPLAINT: -medical management of chronic issues including pelvic fracture hypothyroidism osteoporosis hypertension .    HISTORY OF PRESENT ILLNESS:  This is a patient who is 81 years old.  We actually had her in the facility in October, at which time she had fallen and fractured her right inferior and superior pubic rami.  She lives on her own in [place].  She apparently went home and had somebody staying with her for a while...    She was apparently found wandering at home in her yard.  She apparently had had a worsening of her mental status for several days.  She did not have evidence of systemic infection.    LABS/RADIOLOGY:  Work-up for her altered mental status was extensive and included vitamin B12, TSH, sedimentation rate, RPR, MRI of the brain which indicated a left parietal infarct.    She had arterial dopplers of her carotids that did not show any significant stenosis on the left.  On the right, this was abnormal although it was not felt that she would be a vascular surgery candidate.    CT scan of the abdomen and pelvis was done on 01/10/2015 that showed endplate compression fracture of L2.  There were no lytic or sclerotic lesions.      .    As noted, her MRI showed a small acute infarct in the left parietal cortex.      Her delirium resolved and she was apparently placed here for long-term placement-her stay here has been quite unremarkable she is pleasant appropriate nursing staff does not report any issues.   I did see her several months ago for chest pain this was thought to be GI in etiology and we started her on Prilosec there's been no complaints since then.  She has been treated previously for right leg cellulitis complicated with  venous stasis-apparently this is doing well --her wound is minimal at this point this is followed by nursing-- she currently has  leg wrapped  Apparently she is eating fairly well                    PAST MEDICAL HISTORY/PROBLEM LIST:                        Glaucoma.    Thyroid disease.     Hard of hearing.    Gastroesophageal reflux disease.    Pelvic fractures, as mentioned.    Chronic back pain.     PAST SURGICAL HISTORY:                        Hemorrhoid surgery.     Cataract extraction.    CURRENT MEDICATIONS:  Medication list is reviewed.                    Tylenol 650 q.6 p.r.n.           Miacalcin 1 spray, alternating nostrils daily.    Calcium carbonate 750, 1 tablet at bedtime as needed.  Calcium carbonate plus D, 1 tablet every morning.    Vitamin D3, 1000 daily.     Synthroid 75 q.d.      Fibercon 625 at bedtime.    MiraLAX 17 g daily p.r.n.      Timolol 0.5% ophthalmic, 1 drop into both eyes at bedtime  Norvasc 5 mg QD.    She was taken off diclofenac, ibuprofen, and Norco in the hospital.     SOCIAL HISTORY:    I.  Prior to October, this was an independent woman living in her own home in [place].  She went home, had assistance at home .  Again, her exact functional status until she deteriorated, which according to the history was shortly before she was admitted to hospital, is uncertain.    REVIEW OF SYSTEMS:  Obtained from nursing staff and patient.  In general does not complain of any fever or chills.  Skin is not complaining of rashes or itching has numerous widespread solar induced changes however.  Head ears eyes nose mouth and throat does not complain of any visual changes sore throat or dysphagia.  Respiratory denies any shortness breath or cough.  Cardiac no chest pain has some mild right lower leg edema.  GI-does not complaining of abdominal pain Or dysphagia  GU does not complain of dysuria.  Muscle skeletal does  ambulate in a wheelchair is not complaining of joint pain.  Neurologic does not complain of dizziness headache or syncopal-type feelings.  In psych is pleasant apparently confusion at times what is pleasant and appropriate today on exam.  Physical exam.  Temperature 98.0 pulse 84 respirations 20 blood pressure 128/63 weight is 143.8.  In general this is a pleasant elderly female in no distress sitting comfortably in her wheelchair  Her skin is warm and dry has numerous solar induced changes most prominent over her arms and face. Has a history apparently of a venous stasis wound on her right leg which is looking better very minimal per nursing staff area is currently covered  Eyes pupils appear reactive light sclera and conjunctiva are clear visual acuity appears grossly intact.  Oropharynx is clear mucous membranes moist.  Chest is clear to auscultation there is no labored breathing.  Heart is regular rate and rhythm without murmur gallop or rub she has vevous statis changesthere are  positive pedal pulses.  Abdomen is soft nontender there are active bowel sounds.  Muscle skeletal does move all extremities 4 largely ambulates in a wheelchair during the day I do not note any deformities.  Neurologic is grossly intact no lateralizing findings her speech is clear.  Psych she is alert and grossly oriented apparently she does have some periods of confusion but she is pleasant and appropriate follow verbal commands without difficulty  Labs.  08/04/2015.  WBC 7.2 hemoglobin 13.9 platelets 339.  Sodium 132 potassium 4.6 BUN 26 creatinine 0.9.  ALT 10-otherwise liver function tests within normal limits  TSH 2.414.  Vitamin D-48  02/06/2015.  Sodium 138 potassium 3.8 BUN 28 creatinine 0.72.  01/30/2015.  Hemoglobin A1c 5.8.  Cholesterol 105-triglycerides 78-HDL 44-LDL 45.  01/29/2015 TSH was 1.344.  Albumin 3.6.  01/28/2015.  WBC 5.9 hemoglobin 12.1 platelets  360         ASSESSMENT/PLAN:                   #1- Suspect GERD-this appears stable on Prilosec she has not complained of chest pain apparently since this was started.   #2  history of right leg cellulitis apparently this has resolved now off an antibiotic is is followed by nursing area currently covered she is receiving topical treatment  #3 history CVA she is on aspirin appears to have fairly minimal deficits here she has actually done quite well here the initial delirium appears to have resolved fairly quickly and has not returned since shortly after her admission.  Hypothyroidism she is on Synthroid recent TSH back in August was within normal limits at 2.414.  #5 history of hypertension This appears stable she is on low dose Norvasc at 5 mg a day recent blood pressures 128/63-132/60  #6 as history of pelvic fracture this appears to have healed unremarkably she does have osteoporosis she is on calcium supplementation.  #7 history constipation apparently this has not been an issue she is on MiraLAX.   Clinically she appears stable and doing quite well we will update her CBC and CMP for updated values-her stay continues to be quite unremarkable  [id number]   .                     CLINICAL DATA:  Altered mental status   EXAM: MRI HEAD WITHOUT CONTRAST   TECHNIQUE: Multiplanar, multiecho pulse sequences of the brain and surrounding structures were obtained without intravenous contrast.   COMPARISON:  CT head 01/28/2015   FINDINGS: Moderate atrophy. Negative for hydrocephalus. Pituitary normal in size.   Small acute infarct in the left parietal cortex measuring less than 1 cm. No other acute infarct.   Chronic microvascular ischemic changes in the white matter, mild to moderate in degree. Brainstem and cerebellum intact.   Negative for hemorrhage or fluid collection.   Negative for mass or edema.  No shift of the midline structures.   Paranasal sinuses  clear.   IMPRESSION: Small acute infarct left parietal cortex likely in the precentral cortex.   Atrophy and chronic microvascular ischemia.     Electronically Signed   By: Marlan Palauharles  Clark M.D.   On: 01/30/2015 10:58

## 2015-12-14 ENCOUNTER — Encounter (HOSPITAL_COMMUNITY)
Admission: AD | Admit: 2015-12-14 | Discharge: 2015-12-14 | Disposition: A | Discharge status: Home / Self Care | Payer: 59 | Source: Skilled Nursing Facility | Attending: Internal Medicine | Admitting: Internal Medicine

## 2015-12-14 DIAGNOSIS — I639 Cerebral infarction, unspecified: Secondary | ICD-10-CM | POA: Diagnosis not present

## 2015-12-14 LAB — COMPREHENSIVE METABOLIC PANEL
ALT: 11 U/L — ABNORMAL LOW (ref 14–54)
AST: 18 U/L (ref 15–41)
Albumin: 3.9 g/dL (ref 3.5–5.0)
Alkaline Phosphatase: 62 U/L (ref 38–126)
Anion gap: 9 (ref 5–15)
BILIRUBIN TOTAL: 0.4 mg/dL (ref 0.3–1.2)
BUN: 24 mg/dL — ABNORMAL HIGH (ref 6–20)
CHLORIDE: 98 mmol/L — AB (ref 101–111)
CO2: 30 mmol/L (ref 22–32)
Calcium: 9.6 mg/dL (ref 8.9–10.3)
Creatinine, Ser: 0.8 mg/dL (ref 0.44–1.00)
GFR, EST NON AFRICAN AMERICAN: 58 mL/min — AB (ref >60)
Glucose, Bld: 121 mg/dL — ABNORMAL HIGH (ref 65–99)
POTASSIUM: 4.1 mmol/L (ref 3.5–5.1)
Sodium: 137 mmol/L (ref 135–145)
TOTAL PROTEIN: 7.8 g/dL (ref 6.5–8.1)

## 2015-12-14 LAB — CBC WITH DIFFERENTIAL/PLATELET
Basophils Absolute: 0.1 10*3/uL (ref 0.0–0.1)
Basophils Relative: 1 %
EOS PCT: 11 %
Eosinophils Absolute: 0.6 10*3/uL (ref 0.0–0.7)
HCT: 43.2 % (ref 36.0–46.0)
Hemoglobin: 14.3 g/dL (ref 12.0–15.0)
LYMPHS ABS: 1.6 10*3/uL (ref 0.7–4.0)
Lymphocytes Relative: 28 %
MCH: 30 pg (ref 26.0–34.0)
MCHC: 33.1 g/dL (ref 30.0–36.0)
MCV: 90.8 fL (ref 78.0–100.0)
MONO ABS: 0.4 10*3/uL (ref 0.1–1.0)
Monocytes Relative: 7 %
NEUTROS PCT: 53 %
Neutro Abs: 3 10*3/uL (ref 1.7–7.7)
PLATELETS: 343 10*3/uL (ref 150–400)
RBC: 4.76 MIL/uL (ref 3.87–5.11)
RDW: 14.2 % (ref 11.5–15.5)
WBC: 5.6 10*3/uL (ref 4.0–10.5)

## 2015-12-27 ENCOUNTER — Non-Acute Institutional Stay (SKILLED_NURSING_FACILITY): Payer: 59 | Admitting: Internal Medicine

## 2015-12-27 DIAGNOSIS — L03115 Cellulitis of right lower limb: Secondary | ICD-10-CM

## 2016-01-01 NOTE — Progress Notes (Signed)
Patient ID: Tina Lane, female   DOB: 12/28/1912, [age] y.o.   MRN: [id number]                PROGRESS NOTE  DATE:  12/27/2015       FACILITY: [facility]             LEVEL OF CARE:   AL   Acute Visit            CHIEF COMPLAINT:  Recurrence of right leg wound.    HISTORY OF PRESENT ILLNESS:  I saw this lady in early November.  She had fallen and had a hematoma on the right anterior leg.  She had surrounding venous insufficiency and inflammation.  I gave her empiric antibiotics.  The wound was eventually resolved on 12/18/2015.    Apparently, the facility staff made note of erythema on the anterior leg yesterday.  Today, there is an open area with erythema that goes up the leg and also down towards her anterior ankle.    REVIEW OF SYSTEMS:   Not possible.       PHYSICAL EXAMINATION:   GENERAL APPEARANCE:  The patient does not look systemically unwell.  She is not running a fever.         CHEST/RESPIRATORY:  Very kyphotic.  Shallow air entry.  However, work of breathing is normal.   CARDIOVASCULAR:   CARDIAC:  Heart sounds are normal.  She appears to be euvolemic.       GASTROINTESTINAL:   ABDOMEN:  Soft.   LIVER/SPLEEN/KIDNEYS:  No liver, no spleen.  No tenderness.     SKIN:   INSPECTION:  Right leg:  There is swelling in the right leg.  An area of denuded skin on the anterior part of this.  The more worrisome issue here is extensive erythema which spreads superiorly from the wound and also down towards her anterior ankle.    ASSESSMENT/PLAN:           Cellulitis with reopening of this leg.  I made note of venous insufficiency in this patient.  However, this certainly looked like cellulitis.  She will need silver alginate/Kerlix/Coban to the wound, and empiric antibiotics.  I am going to give her oral antibiotics in the form of doxycycline and Cipro for seven days.      CPT CODE: [id number]

## 2016-01-12 ENCOUNTER — Encounter: Payer: Self-pay | Admitting: Internal Medicine

## 2016-01-12 ENCOUNTER — Non-Acute Institutional Stay (SKILLED_NURSING_FACILITY): Payer: 59 | Admitting: Internal Medicine

## 2016-01-12 DIAGNOSIS — E038 Other specified hypothyroidism: Secondary | ICD-10-CM | POA: Diagnosis not present

## 2016-01-12 DIAGNOSIS — I10 Essential (primary) hypertension: Secondary | ICD-10-CM | POA: Diagnosis not present

## 2016-01-12 DIAGNOSIS — K219 Gastro-esophageal reflux disease without esophagitis: Secondary | ICD-10-CM | POA: Diagnosis not present

## 2016-01-12 DIAGNOSIS — Z8673 Personal history of transient ischemic attack (TIA), and cerebral infarction without residual deficits: Secondary | ICD-10-CM

## 2016-01-12 DIAGNOSIS — L03116 Cellulitis of left lower limb: Secondary | ICD-10-CM

## 2016-01-12 NOTE — Progress Notes (Signed)
Patient ID: Tina Lane, female   DOB: 03/20/1912, 82 y.o.   MRN: [id number]                L  DATE:  01/12/2016                   FACILITY: [facility]      LEVEL OF CARE:  Skilled  This is a routine visit   CHIEF COMPLAINT: -medical management of chronic issues including pelvic fracture hypothyroidism osteoporosis hypertension .    HISTORY OF PRESENT ILLNESS:  This is a patient who is 82 years old.  We actually had her in the facility in October, at which time she had fallen and fractured her right inferior and superior pubic rami.  She lives on her own in [place].  She apparently went home and had somebody staying with her for a while...    She was apparently found wandering at home in her yard.  She apparently had had a worsening of her mental status for several days.  She did not have evidence of systemic infection.  :  Work-up for her altered mental status was extensive and included vitamin B12, TSH, sedimentation rate, RPR, MRI of the brain which indicated a left parietal infarct.    She had arterial dopplers of her carotids that did not show any significant stenosis on the left.  On the right, this was abnormal although it was not felt that she would be a vascular surgery candidate.    CT scan of the abdomen and pelvis was done on 01/10/2015 that showed endplate compression fracture of L2.  There were no lytic or sclerotic lesions.      .    As noted, her MRI showed a small acute infarct in the left parietal cortex.      Her delirium resolved and she was apparently placed here for long-term placement-her stay here has been quite unremarkable she is pleasant appropriate nursing staff does not report any issues.   I did see her several months ago for chest pain this was thought to be GI in etiology and we started her on Prilosec there've been no complaints since then my knowledge.  She has been treated previously for right leg cellulitis complicated with  venous stasis- She recently completed a course of doxycycline and Cipro- does have some chronic erythema venous stasis mid right leg-I did assess her with her nurse tonight who was somewhat concerned however I did also speak with the wound care nurse who is quite familiar with her and she says this is pretty baseline for her at this point being treated with topical treatment this will have to be watched certainly for any changes however l Today she has no complaints is resting in bed comfortably                   PAST MEDICAL HISTORY/PROBLEM LIST:                        Glaucoma.    Thyroid disease.     Hard of hearing.    Gastroesophageal reflux disease.    Pelvic fractures, as mentioned.    Chronic back pain.     PAST SURGICAL HISTORY:                        Hemorrhoid surgery.     Cataract extraction.    CURRENT MEDICATIONS:  Medication list is reviewed.                    Tylenol 650 q.6 p.r.n.           Miacalcin 1 spray, alternating nostrils daily.    Calcium carbonate 750, 1 tablet at bedtime as needed.    Calcium carbonate plus D, 1 tablet every morning.    Vitamin D3, 1000 daily.     Synthroid 75 q.d.      Fibercon 625 at bedtime.    MiraLAX 17 g daily p.r.n.      Timolol 0.5% ophthalmic, 1 drop into both eyes at bedtime  Norvasc 5 mg QD.    She was taken off diclofenac, ibuprofen, and Norco in the hospital.     SOCIAL HISTORY:    I.  Prior to October, this was an independent woman living in her own home in [place].  She went home, had assistance at home .  Again, her exact functional status until she deteriorated, which according to the history was shortly before she was admitted to hospital, is uncertain.    REVIEW OF SYSTEMS:  Obtained from nursing staff and patient.  In general does not complain of any fever or chills.  Skin is not complaining of rashes or itching has numerous widespread solar induced changes however.-History of right leg  cellulitis and venous stasis changes as noted above  Head ears eyes nose mouth and throat does not complain of any visual changes sore throat or dysphagia.  Respiratory denies any shortness breath or cough.  Cardiac no chest pain has some mild right lower leg edema.  GI-does not complaining of abdominal pain Or dysphagia  GU does not complain of dysuria.  Muscle skeletal does ambulate in a wheelchair is not complaining of joint pain.  Neurologic does not complain of dizziness headache or syncopal-type feelings.   psych is pleasant apparently confusion at times what is pleasant and appropriate today on exam.  Physical exam.  Temperature 98.0 pulse 60 respirations 18 blood pressure 132/62. Weight is stable at 145.2  In general this is a pleasant elderly female in no distress sitting comfortably in her wheelchair  Her skin is warm and dry has numerous solar induced changes most prominent over her arms and face. Has a history apparently of a venous stasis wound on her right leg  There is some erythema on the right leg but after speaking with wound care apparently this is baseline I do not see extension into the ankle area or towards the knee which apparently was the case when she had cellulitis  Eyes pupils appear reactive light sclera and conjunctiva are clear visual acuity appears grossly intact.  Oropharynx is clear mucous membranes moist.  Chest is clear to auscultation there is no labored breathing.  Heart is regular rate and rhythm without murmur gallop or rub she has vevous statis changesthere are  positive pedal pulses.  Abdomen is soft nontender there are active bowel sounds.  Muscle skeletal does move all extremities 4 largely ambulates in a wheelchair during the day I do not note any deformities.  Neurologic is grossly intact no lateralizing findings her speech is clear.  Psych she is alert and grossly oriented apparently she does have some periods of confusion but  she is pleasant and appropriate follow verbal commands without difficulty  Labs.  12/14/2015.  Sodium 137 potassium 4.1 BUN 24 creatinine 0.8 CO2 level was 30.  ALT 11 otherwise liver function tests within  normal limits.  WBC 5.6 hemoglobin 14.3 platelets 343  08/04/2015.  WBC 7.2 hemoglobin 13.9 platelets 339.  Sodium 132 potassium 4.6 BUN 26 creatinine 0.9.  ALT 10-otherwise liver function tests within normal limits  TSH 2.414.  Vitamin D-48  02/06/2015.  Sodium 138 potassium 3.8 BUN 28 creatinine 0.72.  01/30/2015.  Hemoglobin A1c 5.8.  Cholesterol 105-triglycerides 78-HDL 44-LDL 45.  01/29/2015 TSH was 1.344.  Albumin 3.6.  01/28/2015.  WBC 5.9 hemoglobin 12.1 platelets 360         ASSESSMENT/PLAN:                   #1- Suspect GERD-this appears stable on Prilosec this appears stable with no recent complaints of chest pain to my knowledge.   #2 history of right leg cellulitis apparently this has resolved now off an antibiotic she does have some erythema here-which apparently is chronic-- this will have to be monitored for any changes increased erythema or drainage  #3 history CVA she is on aspirin appears to have fairly minimal deficits here she has actually done quite well here the initial delirium appears to have resolved fairly quickly and has not returned since shortly after her admission.  Hypothyroidism she is on Synthroid recent TSH back in August was within normal limits at 2.414.  #5 history of hypertension This appears stable she is on low dose Norvasc at 5 mg a day recent blood pressures 132/62-132/54-109/62  #6 as history of pelvic fracture this appears to have healed unremarkably she does have osteoporosis she is on calcium supplementation.  #7 history constipation apparently this has not been an issue she is on MiraLAX.   Clinically she appears stable and doing quite well we will update her CBC and BMP for updated values-her stay  continues to be fairly unremarkable  CPT-[id number]   .                     CLINICAL DATA:  Altered mental status   EXAM: MRI HEAD WITHOUT CONTRAST   TECHNIQUE: Multiplanar, multiecho pulse sequences of the brain and surrounding structures were obtained without intravenous contrast.   COMPARISON:  CT head 01/28/2015   FINDINGS: Moderate atrophy. Negative for hydrocephalus. Pituitary normal in size.   Small acute infarct in the left parietal cortex measuring less than 1 cm. No other acute infarct.   Chronic microvascular ischemic changes in the white matter, mild to moderate in degree. Brainstem and cerebellum intact.   Negative for hemorrhage or fluid collection.   Negative for mass or edema.  No shift of the midline structures.   Paranasal sinuses clear.   IMPRESSION: Small acute infarct left parietal cortex likely in the precentral cortex.   Atrophy and chronic microvascular ischemia.     Electronically Signed   By: Marlan Palauharles  Clark M.D.   On: 01/30/2015 10:58

## 2016-01-14 ENCOUNTER — Emergency Department (HOSPITAL_COMMUNITY): Payer: 59

## 2016-01-14 ENCOUNTER — Encounter (HOSPITAL_COMMUNITY): Payer: Self-pay

## 2016-01-14 ENCOUNTER — Inpatient Hospital Stay (HOSPITAL_COMMUNITY)
Admission: EM | Admit: 2016-01-14 | Discharge: 2016-01-15 | DRG: 313 | Disposition: A | Discharge status: Skilled Nursing Facility | Payer: 59 | Attending: Internal Medicine | Admitting: Internal Medicine

## 2016-01-14 DIAGNOSIS — Z66 Do not resuscitate: Secondary | ICD-10-CM | POA: Diagnosis present

## 2016-01-14 DIAGNOSIS — I10 Essential (primary) hypertension: Secondary | ICD-10-CM | POA: Diagnosis present

## 2016-01-14 DIAGNOSIS — H409 Unspecified glaucoma: Secondary | ICD-10-CM | POA: Diagnosis present

## 2016-01-14 DIAGNOSIS — R079 Chest pain, unspecified: Secondary | ICD-10-CM | POA: Diagnosis present

## 2016-01-14 DIAGNOSIS — E039 Hypothyroidism, unspecified: Secondary | ICD-10-CM | POA: Diagnosis present

## 2016-01-14 DIAGNOSIS — H9190 Unspecified hearing loss, unspecified ear: Secondary | ICD-10-CM | POA: Diagnosis present

## 2016-01-14 DIAGNOSIS — Z7982 Long term (current) use of aspirin: Secondary | ICD-10-CM | POA: Diagnosis not present

## 2016-01-14 DIAGNOSIS — K219 Gastro-esophageal reflux disease without esophagitis: Secondary | ICD-10-CM | POA: Diagnosis present

## 2016-01-14 DIAGNOSIS — I5032 Chronic diastolic (congestive) heart failure: Secondary | ICD-10-CM | POA: Diagnosis not present

## 2016-01-14 DIAGNOSIS — Z8249 Family history of ischemic heart disease and other diseases of the circulatory system: Secondary | ICD-10-CM | POA: Diagnosis not present

## 2016-01-14 HISTORY — DX: Disorientation, unspecified: R41.0

## 2016-01-14 HISTORY — DX: Do not resuscitate: Z66

## 2016-01-14 LAB — CBC WITH DIFFERENTIAL/PLATELET
Basophils Absolute: 0.1 10*3/uL (ref 0.0–0.1)
Basophils Relative: 1 %
Eosinophils Absolute: 1 10*3/uL — ABNORMAL HIGH (ref 0.0–0.7)
Eosinophils Relative: 12 %
HEMATOCRIT: 42.6 % (ref 36.0–46.0)
Hemoglobin: 13.8 g/dL (ref 12.0–15.0)
LYMPHS ABS: 2.1 10*3/uL (ref 0.7–4.0)
LYMPHS PCT: 24 %
MCH: 29.2 pg (ref 26.0–34.0)
MCHC: 32.4 g/dL (ref 30.0–36.0)
MCV: 90.3 fL (ref 78.0–100.0)
MONO ABS: 1 10*3/uL (ref 0.1–1.0)
MONOS PCT: 12 %
NEUTROS ABS: 4.4 10*3/uL (ref 1.7–7.7)
Neutrophils Relative %: 51 %
Platelets: 357 10*3/uL (ref 150–400)
RBC: 4.72 MIL/uL (ref 3.87–5.11)
RDW: 14 % (ref 11.5–15.5)
WBC: 8.6 10*3/uL (ref 4.0–10.5)

## 2016-01-14 LAB — TROPONIN I
Troponin I: <0.03 ng/mL (ref <0.031)
Troponin I: <0.03 ng/mL (ref <0.031)

## 2016-01-14 LAB — BASIC METABOLIC PANEL
Anion gap: 9 (ref 5–15)
BUN: 27 mg/dL — AB (ref 6–20)
CHLORIDE: 97 mmol/L — AB (ref 101–111)
CO2: 29 mmol/L (ref 22–32)
Calcium: 9.8 mg/dL (ref 8.9–10.3)
Creatinine, Ser: 0.9 mg/dL (ref 0.44–1.00)
GFR calc Af Amer: 58 mL/min — ABNORMAL LOW (ref >60)
GFR calc non Af Amer: 50 mL/min — ABNORMAL LOW (ref >60)
GLUCOSE: 103 mg/dL — AB (ref 65–99)
Potassium: 4.5 mmol/L (ref 3.5–5.1)
Sodium: 135 mmol/L (ref 135–145)

## 2016-01-14 MED ORDER — ONDANSETRON HCL 4 MG/2ML IJ SOLN: 4.0000 mg | Freq: Four times a day (QID) | INTRAMUSCULAR | Status: DC | PRN

## 2016-01-14 MED ORDER — CALCIUM CARBONATE ANTACID 500 MG PO CHEW: 1.0000 | CHEWABLE_TABLET | Freq: Every evening | ORAL | Status: DC | PRN

## 2016-01-14 MED ORDER — ASPIRIN EC 81 MG PO TBEC
81.0000 mg | DELAYED_RELEASE_TABLET | Freq: Every day | ORAL | Status: DC
  Administered 2016-01-15: 81 mg via ORAL
  Filled 2016-01-14: qty 1

## 2016-01-14 MED ORDER — CALCITONIN (SALMON) 200 UNIT/ACT NA SOLN
1.0000 | Freq: Every day | NASAL | Status: DC
  Administered 2016-01-15: 1 via NASAL
  Filled 2016-01-14: qty 3.7

## 2016-01-14 MED ORDER — ACETAMINOPHEN 325 MG PO TABS: 650.0000 mg | ORAL_TABLET | ORAL | Status: DC | PRN

## 2016-01-14 MED ORDER — ENOXAPARIN SODIUM 30 MG/0.3ML ~~LOC~~ SOLN
30.0000 mg | SUBCUTANEOUS | Status: DC
  Administered 2016-01-15: 30 mg via SUBCUTANEOUS
  Filled 2016-01-14: qty 0.3

## 2016-01-14 MED ORDER — TIMOLOL HEMIHYDRATE 0.5 % OP SOLN: 1.0000 [drp] | Freq: Every day | OPHTHALMIC | Status: DC

## 2016-01-14 MED ORDER — ENOXAPARIN SODIUM 40 MG/0.4ML ~~LOC~~ SOLN: 40.0000 mg | SUBCUTANEOUS | Status: DC

## 2016-01-14 MED ORDER — TIMOLOL MALEATE 0.5 % OP SOLN
1.0000 [drp] | Freq: Every day | OPHTHALMIC | Status: DC
  Filled 2016-01-14: qty 5

## 2016-01-14 MED ORDER — VITAMIN D 1000 UNITS PO TABS
ORAL_TABLET | Freq: Every morning | ORAL | Status: DC
  Administered 2016-01-15: 1000 [IU] via ORAL
  Filled 2016-01-14: qty 1

## 2016-01-14 MED ORDER — CALCIUM CARBONATE-VITAMIN D 500-200 MG-UNIT PO TABS
2.0000 | ORAL_TABLET | Freq: Every day | ORAL | Status: DC
  Administered 2016-01-15: 2 via ORAL
  Filled 2016-01-14 (×3): qty 2

## 2016-01-14 MED ORDER — ASPIRIN 81 MG PO CHEW
324.0000 mg | CHEWABLE_TABLET | Freq: Once | ORAL | Status: AC
  Administered 2016-01-14: 324 mg via ORAL
  Filled 2016-01-14: qty 4

## 2016-01-14 MED ORDER — CALCIUM POLYCARBOPHIL 625 MG PO TABS
ORAL_TABLET | ORAL | Status: AC
  Filled 2016-01-14: qty 1

## 2016-01-14 MED ORDER — PANTOPRAZOLE SODIUM 40 MG PO TBEC
40.0000 mg | DELAYED_RELEASE_TABLET | Freq: Every day | ORAL | Status: DC
  Administered 2016-01-15: 40 mg via ORAL
  Filled 2016-01-14: qty 1

## 2016-01-14 MED ORDER — ACETAMINOPHEN 325 MG PO TABS: 650.0000 mg | ORAL_TABLET | Freq: Four times a day (QID) | ORAL | Status: DC | PRN

## 2016-01-14 MED ORDER — LEVOTHYROXINE SODIUM 75 MCG PO TABS
75.0000 ug | ORAL_TABLET | Freq: Every day | ORAL | Status: DC
  Administered 2016-01-15: 75 ug via ORAL
  Filled 2016-01-14: qty 1

## 2016-01-14 MED ORDER — POLYETHYLENE GLYCOL 3350 17 G PO PACK
17.0000 g | PACK | Freq: Every day | ORAL | Status: DC
  Administered 2016-01-15: 17 g via ORAL
  Filled 2016-01-14: qty 1

## 2016-01-14 MED ORDER — AMLODIPINE BESYLATE 5 MG PO TABS
5.0000 mg | ORAL_TABLET | Freq: Every day | ORAL | Status: DC
  Administered 2016-01-15: 5 mg via ORAL
  Filled 2016-01-14: qty 1

## 2016-01-14 MED ORDER — CALCIUM CARBONATE-VITAMIN D 600-400 MG-UNIT PO TABS: 1.0000 | ORAL_TABLET | Freq: Every morning | ORAL | Status: DC

## 2016-01-14 MED ORDER — CALCIUM POLYCARBOPHIL 625 MG PO TABS
625.0000 mg | ORAL_TABLET | Freq: Every day | ORAL | Status: DC
  Administered 2016-01-14: 625 mg via ORAL
  Filled 2016-01-14 (×3): qty 1

## 2016-01-14 NOTE — ED Notes (Signed)
Pt here from [facility] for evaluation of chest pain after participating in an activity. Pt was placed on 02 @ 2 liters prior to transport and pt is pain free on arrival to the ED

## 2016-01-14 NOTE — ED Provider Notes (Signed)
CSN: [id number]     Arrival date & time 01/14/16  1543 History   First MD Initiated Contact with Patient 01/14/16 1552     Chief Complaint  Patient presents with  . Chest Pain      Patient is a 82 y.o. female presenting with chest pain. The history is provided by the patient and the nursing home. The history is limited by the condition of the patient (hx dementia).  Chest Pain Pt was seen at 1555. Per EMS, NH report and pt: Pt states she was sitting in her wheelchair "watching a singer" when she developed mid-sternal chest "pain." Describes the pain as "heaviness." Was associated with nausea. Pt states she was wheeled back to her room and EMS was called.  Pt states the EMS staff "put oxygen on me" and her pain is now improved by her arrival to the ED. Denies palpitations, no SOB/cough, no abd pain, no back pain.   Past Medical History  Diagnosis Date  . Glaucoma   . Thyroid disease   . GERD (gastroesophageal reflux disease)   . HOH (hard of hearing)   . Pelvic fracture (HCC)     snf  . Back pain   . Confusion   . DNR (do not resuscitate)    Past Surgical History  Procedure Laterality Date  . Abdominal surgery    . Hemorrhoid surgery    . Cataract extraction     Family History  Problem Relation Age of Onset  . Cancer Sister     unknown  . Cancer Sister     unknown  . Coronary artery disease Father   . Cancer Mother     died age 59   Social History  Substance Use Topics  . Smoking status: Never Smoker   . Smokeless tobacco: Never Used  . Alcohol Use: No    Review of Systems  Unable to perform ROS: Dementia  Cardiovascular: Positive for chest pain.      Allergies  Latex; Onion; Other; Penicillins; and Sulfa antibiotics  Home Medications   Prior to Admission medications   Medication Sig Start Date End Date Taking? Authorizing Provider  acetaminophen (TYLENOL) 325 MG tablet Take 2 tablets (650 mg total) by mouth every 6 (six) hours as needed for mild pain.  09/29/14   Gwenyth Bender, NP  amLODipine (NORVASC) 5 MG tablet Take 1 tablet (5 mg total) by mouth daily. 01/31/15   Erick Blinks, MD  aspirin EC 81 MG EC tablet Take 1 tablet (81 mg total) by mouth daily. 01/31/15   Erick Blinks, MD  calcitonin, salmon, (MIACALCIN/FORTICAL) 200 UNIT/ACT nasal spray Place 1 spray into alternate nostrils daily.  01/24/15   Historical Provider, MD  calcium carbonate (TUMS EX) 750 MG chewable tablet Chew 1 tablet by mouth at bedtime as needed for heartburn.     Historical Provider, MD  Calcium Carbonate-Vitamin D (CALCIUM 600 + D PO) Take 1 tablet by mouth every morning.     Historical Provider, MD  Cholecalciferol (VITAMIN D3) 1000 UNITS CAPS Take 1 capsule by mouth every morning.     Historical Provider, MD  levothyroxine (SYNTHROID, LEVOTHROID) 75 MCG tablet Take 75 mcg by mouth daily before breakfast.    Historical Provider, MD  magnesium hydroxide (MILK OF MAGNESIA) 400 MG/5ML suspension Give 30ml by mouth once daily as needed for constipation. Do not give for more than 2 consecutive days.    Historical Provider, MD  polycarbophil (FIBERCON) 625 MG tablet Take 625 mg  by mouth at bedtime.    Historical Provider, MD  polyethylene glycol (MIRALAX / GLYCOLAX) packet Take 17 g by mouth daily as needed.    Historical Provider, MD  timolol (BETIMOL) 0.5 % ophthalmic solution Place 1 drop into both eyes at bedtime.      Historical Provider, MD   BP 141/68 mmHg  Pulse 84  Temp(Src) 97.6 F (36.4 C) (Oral)  Resp 20  SpO2 95% Physical Exam  1600: Physical examination:  Nursing notes reviewed; Vital signs and O2 SAT reviewed;  Constitutional: Well developed, Well nourished, Well hydrated, In no acute distress; Head:  Normocephalic, atraumatic; Eyes: EOMI, PERRL, No scleral icterus; ENMT: Mouth and pharynx normal, Mucous membranes moist; Neck: Supple, Full range of motion, No lymphadenopathy; Cardiovascular: Regular rate and rhythm, No gallop; Respiratory: Breath sounds  clear & equal bilaterally, No wheezes.  Speaking full sentences with ease, Normal respiratory effort/excursion; Chest: Nontender, Movement normal; Abdomen: Soft, Nontender, Nondistended, Normal bowel sounds; Genitourinary: No CVA tenderness; Extremities: Pulses normal, No tenderness, No edema, No calf edema or asymmetry.; Neuro: Awake, alert, mild confusion per baseline. +very HOH, otherwise major CN grossly intact.  Speech clear. No gross focal motor deficits in extremities.; Skin: Color normal, Warm, Dry.   ED Course  Procedures (including critical care time) Labs Review   Imaging Review  I have personally reviewed and evaluated these images and lab results as part of my medical decision-making.   EKG Interpretation   Date/Time:  Sunday January 14 2016 15:57:23 EST Ventricular Rate:  86 PR Interval:  164 QRS Duration: 86 QT Interval:  355 QTC Calculation: 425 R Axis:   64 Text Interpretation:  Sinus rhythm When compared with ECG of 07/16/2014 No  significant change was found Confirmed by [facility]  MD, Nicholos Johns [phone]) on  01/14/2016 4:30:13 PM      MDM  MDM Reviewed: previous chart, nursing note and vitals Reviewed previous: labs and ECG Interpretation: labs, ECG and x-ray     Results for orders placed or performed during the hospital encounter of 01/14/16  Basic metabolic panel  Result Value Ref Range   Sodium 135 135 - 145 mmol/L   Potassium 4.5 3.5 - 5.1 mmol/L   Chloride 97 (L) 101 - 111 mmol/L   CO2 29 22 - 32 mmol/L   Glucose, Bld 103 (H) 65 - 99 mg/dL   BUN 27 (H) 6 - 20 mg/dL   Creatinine, Ser 6.04 0.44 - 1.00 mg/dL   Calcium 9.8 8.9 - 54.0 mg/dL   GFR calc non Af Amer 50 (L) >60 mL/min   GFR calc Af Amer 58 (L) >60 mL/min   Anion gap 9 5 - 15  Troponin I  Result Value Ref Range   Troponin I <0.03 <0.031 ng/mL  CBC with Differential  Result Value Ref Range   WBC 8.6 4.0 - 10.5 K/uL   RBC 4.72 3.87 - 5.11 MIL/uL   Hemoglobin 13.8 12.0 - 15.0 g/dL   HCT  98.1 19.1 - 47.8 %   MCV 90.3 78.0 - 100.0 fL   MCH 29.2 26.0 - 34.0 pg   MCHC 32.4 30.0 - 36.0 g/dL   RDW 29.5 62.1 - 30.8 %   Platelets 357 150 - 400 K/uL   Neutrophils Relative % 51 %   Neutro Abs 4.4 1.7 - 7.7 K/uL   Lymphocytes Relative 24 %   Lymphs Abs 2.1 0.7 - 4.0 K/uL   Monocytes Relative 12 %   Monocytes Absolute 1.0 0.1 -  1.0 K/uL   Eosinophils Relative 12 %   Eosinophils Absolute 1.0 (H) 0.0 - 0.7 K/uL   Basophils Relative 1 %   Basophils Absolute 0.1 0.0 - 0.1 K/uL   Dg Chest 2 View 01/14/2016  CLINICAL DATA:  Chest pain EXAM: CHEST  2 VIEW COMPARISON:  01/28/2015 FINDINGS: Normal mediastinum and cardiac silhouette. Chronic central bronchitic markings. Normal pulmonary vasculature. No effusion, infiltrate, or pneumothorax. IMPRESSION: Chronic bronchitic markings.  No acute findings. Electronically Signed   By: Genevive BiStewart  Edmunds M.D.   On: 01/14/2016 16:35    1710:  Remains without symptoms while in the ED. Dx and testing d/w pt and family.  Questions answered.  Verb understanding, agreeable to observation admit.  T/C to [facility] Dr. Karilyn CotaGosrani, case discussed, including:  HPI, pertinent PM/SHx, VS/PE, dx testing, ED course and treatment:  Agreeable to admit, requests to write temporary orders, obtain observation tele bed to team APAdmits.   Samuel JesterKathleen Cougar Imel, DO 01/17/16 1706

## 2016-01-14 NOTE — H&P (Signed)
[facility] History and Physical  Tina Lane DXA:[id number] DOB: February 20, 1912 DOA: 01/14/2016  Referring physician: ER PCP: Willow OraANDY,[name], MD   Chief Complaint: Chest pain  HPI: Tina Lane is a [age] y.o. female  This 82 year old(!) delightful lady presents from the nursing home with an episode of chest pain this afternoon. She thinks it lasted approximately 15-20 minutes. It was relieved when she came to the emergency room and was given aspirin. She describes it as a heaviness and was associated with dyspnea and it was in the midsternal area but did not radiate. She also was helped by oxygen placed on her. She apparently has had pain like this previously but has never had history of coronary artery disease. She denies any fever, cough, nausea or vomiting. She is now being admitted for further investigation.   Review of Systems:  Apart from symptoms above, all systems are negative.  Past Medical History  Diagnosis Date  . Glaucoma   . Thyroid disease   . GERD (gastroesophageal reflux disease)   . HOH (hard of hearing)   . Pelvic fracture (HCC)     snf  . Back pain   . Confusion   . DNR (do not resuscitate)    Past Surgical History  Procedure Laterality Date  . Abdominal surgery    . Hemorrhoid surgery    . Cataract extraction     Social History:  reports that she has never smoked. She has never used smokeless tobacco. She reports that she does not drink alcohol or use illicit drugs.  Allergies  Allergen Reactions  . Latex Itching  . Onion Nausea And Vomiting  . Other     SPANDEX ALLERGY  . Penicillins Other (See Comments)    UNKNOWN REACTION  . Sulfa Antibiotics Other (See Comments)    UNKNOWN REACTION    Family History  Problem Relation Age of Onset  . Cancer Sister     unknown  . Cancer Sister     unknown  . Coronary artery disease Father   . Cancer Mother     died age 80    Prior to Admission medications   Medication Sig Start Date End Date  Taking? Authorizing Provider  amLODipine (NORVASC) 5 MG tablet Take 1 tablet (5 mg total) by mouth daily. 01/31/15  Yes Erick BlinksJehanzeb Memon, MD  aspirin EC 81 MG EC tablet Take 1 tablet (81 mg total) by mouth daily. 01/31/15  Yes Erick BlinksJehanzeb Memon, MD  calcitonin, salmon, (MIACALCIN/FORTICAL) 200 UNIT/ACT nasal spray Place 1 spray into alternate nostrils daily.  01/24/15  Yes Historical Provider, MD  Calcium Carbonate-Vitamin D (CALCIUM 600 + D PO) Take 1 tablet by mouth every morning.    Yes Historical Provider, MD  Cholecalciferol (VITAMIN D3) 1000 UNITS CAPS Take 1 capsule by mouth every morning.    Yes Historical Provider, MD  levothyroxine (SYNTHROID, LEVOTHROID) 75 MCG tablet Take 75 mcg by mouth daily before breakfast.   Yes Historical Provider, MD  omeprazole (PRILOSEC) 20 MG capsule Take 20 mg by mouth daily.   Yes Historical Provider, MD  polycarbophil (FIBERCON) 625 MG tablet Take 625 mg by mouth at bedtime.   Yes Historical Provider, MD  polyethylene glycol (MIRALAX / GLYCOLAX) packet Take 17 g by mouth daily.    Yes Historical Provider, MD  timolol (BETIMOL) 0.5 % ophthalmic solution Place 1 drop into both eyes at bedtime.     Yes Historical Provider, MD  acetaminophen (TYLENOL) 325 MG tablet Take 2 tablets (650  mg total) by mouth every 6 (six) hours as needed for mild pain. 09/29/14   Gwenyth Bender, NP  calcium carbonate (TUMS EX) 750 MG chewable tablet Chew 1 tablet by mouth at bedtime as needed for heartburn.     Historical Provider, MD  magnesium hydroxide (MILK OF MAGNESIA) 400 MG/5ML suspension Give 30ml by mouth once daily as needed for constipation. Do not give for more than 2 consecutive days.    Historical Provider, MD   Physical Exam: Filed Vitals:   01/14/16 1553 01/14/16 1613 01/14/16 1702  BP: 141/68  150/68  Pulse: 84  91  Temp: 97.6 F (36.4 C)    TempSrc: Oral    Resp: 20  25  SpO2: 100% 95% 93%    Wt Readings from Last 3 Encounters:  09/22/15 63.322 kg (139 lb 9.6 oz)    01/31/15 55.3 kg (121 lb 14.6 oz)  01/10/15 55.339 kg (122 lb)    General:  Appears calm and comfortable. She does not appear to be in pain at the present time. Eyes: PERRL, normal lids, irises & conjunctiva ENT: grossly normal hearing, lips & tongue Neck: no LAD, masses or thyromegaly Cardiovascular: RRR, no m/r/g. No LE edema. Telemetry: SR, no arrhythmias  Respiratory: CTA bilaterally, no w/r/r. Normal respiratory effort. Abdomen: soft, ntnd Skin: no rash or induration seen on limited exam Musculoskeletal: grossly normal tone BUE/BLE Psychiatric: grossly normal mood and affect, speech fluent and appropriate Neurologic: grossly non-focal.          Labs on Admission:  Basic Metabolic Panel:  Recent Labs Lab 01/14/16 1609  NA 135  K 4.5  CL 97*  CO2 29  GLUCOSE 103*  BUN 27*  CREATININE 0.90  CALCIUM 9.8   Liver Function Tests: No results for input(s): AST, ALT, ALKPHOS, BILITOT, PROT, ALBUMIN in the last 168 hours. No results for input(s): LIPASE, AMYLASE in the last 168 hours. No results for input(s): AMMONIA in the last 168 hours. CBC:  Recent Labs Lab 01/14/16 1609  WBC 8.6  NEUTROABS 4.4  HGB 13.8  HCT 42.6  MCV 90.3  PLT 357   Cardiac Enzymes:  Recent Labs Lab 01/14/16 1609  TROPONINI <0.03    BNP (last 3 results) No results for input(s): BNP in the last 8760 hours.  ProBNP (last 3 results) No results for input(s): PROBNP in the last 8760 hours.  CBG: No results for input(s): GLUCAP in the last 168 hours.  Radiological Exams on Admission: Dg Chest 2 View  01/14/2016  CLINICAL DATA:  Chest pain EXAM: CHEST  2 VIEW COMPARISON:  01/28/2015 FINDINGS: Normal mediastinum and cardiac silhouette. Chronic central bronchitic markings. Normal pulmonary vasculature. No effusion, infiltrate, or pneumothorax. IMPRESSION: Chronic bronchitic markings.  No acute findings. Electronically Signed   By: Genevive Bi M.D.   On: 01/14/2016 16:35    EKG:  Independently reviewed. Sinus rhythm without any acute ST-T wave changes.  Assessment/Plan   1. Chest pain. The description of her pain could be cardiac in origin. Serial cardiac enzymes. I don't think it is a good idea to investigate this lady further for coronary artery disease. I would treat her symptomatically even if she does have myocardial infarction/cardiac ischemia. I will not request cardiology consultation.  She'll be admitted to telemetry. Further recommendations will depend on patient's hospital progress.   Code Status: DO NOT RESUSCITATE. This was confirmed from the nursing home.   DVT Prophylaxis: Lovenox.  Family Communication: I discussed the plan with 2 great  niece is at the bedside.   Disposition Plan: Back to the skilled nursing facility when medically stable.  Time spent: 45 minutes.  Wilson Singer [facility] Pager [phone].

## 2016-01-15 DIAGNOSIS — I5032 Chronic diastolic (congestive) heart failure: Secondary | ICD-10-CM

## 2016-01-15 DIAGNOSIS — I10 Essential (primary) hypertension: Secondary | ICD-10-CM

## 2016-01-15 DIAGNOSIS — R079 Chest pain, unspecified: Principal | ICD-10-CM

## 2016-01-15 DIAGNOSIS — E039 Hypothyroidism, unspecified: Secondary | ICD-10-CM

## 2016-01-15 LAB — BRAIN NATRIURETIC PEPTIDE: B Natriuretic Peptide: 47 pg/mL (ref 0.0–100.0)

## 2016-01-15 LAB — MRSA PCR SCREENING: MRSA BY PCR: NEGATIVE

## 2016-01-15 NOTE — Progress Notes (Signed)
Report called to R. Anast[facility]rejeda[facility]. Patient transferred back to her room 142-D with NT via wheelchair in stable condition. Receiving nurse stated she would call son with an update.

## 2016-01-15 NOTE — Clinical Social Work Note (Signed)
Clinical Social Work Assessment  Patient Details  Name: Tina FolksOlivia M Blackham MRN: [id number] Date of Birth: 1912/10/18  Date of referral:  01/15/16               Reason for consult:  Facility Placement                Permission sought to share information with:    Permission granted to share information::     Name::        Agency::     Relationship::     Contact Information:     Housing/Transportation Living arrangements for the past 2 months:  [facility] Facility Source of Information:  Patient, Adult Children, Facility Patient Interpreter Needed:  None Criminal Activity/Legal Involvement Pertinent to Current Situation/Hospitalization:  No - Comment as needed Significant Relationships:  Adult Children Lives with:  Facility Resident Do you feel safe going back to the place where you live?  Yes Need for family participation in patient care:  Yes (Comment)  Care giving concerns:  None identified. Facility resident.    Social Worker assessment / plan:  [facility] who indicated that patient has been a resident at the facility for a long time. She stated that patient was paid privately.  Tami reported that patient needs assistance with all ADLs and that she ambulates with a walker and assistance with staff.  Tami stated that patient's son, Harrold Donathathan was very supportive.  She advised that patient would not need a new FL2.  CSW advised that patient was being dischaged today.  CSW spoke with Harrold DonathNathan, patient's son.  He confirmed Tami's statements.  Harrold Donathathan advised that [facility]. CSW advised that patient was being discharged today.  [place]athan [place] about three hours before he arrived back in the area.  CSW advised that patient would be back at the facility upon his return to the area.   CSW signing off.   Employment status:  Retired Database administratornsurance information:  Managed Medicare PT Recommendations:   Not assessed at this time Information / Referral to community resources:     Patient/Family's Response to care:  Patient and family are [facility]   Patient/Family's Understanding of and Emotional Response to Diagnosis, Current Treatment, and Prognosis:  Patient's family has understanding of patient's diagnosis, prognosis and treatment.   Emotional Assessment Appearance:  Appears stated age Attitude/Demeanor/Rapport:   (Cooperative, pleasant) Affect (typically observed):  Calm, Accepting Orientation:  Oriented to Self, Oriented to Place, Oriented to Situation Alcohol / Substance use:  Not Applicable Psych involvement (Current and /or in the community):  No (Comment)  Discharge Needs  Concerns to be addressed:  Discharge Planning Concerns Readmission within the last 30 days:  No Current discharge risk:  None Barriers to Discharge:  No Barriers Identified   Annice NeedySettle, Kathren Scearce D, LCSW 01/15/2016, 11:28 AM

## 2016-01-15 NOTE — Plan of Care (Signed)
Problem: Phase I Progression Outcomes Goal: MD aware of Cardiac Marker results Outcome: Progressing Cardiac markers negative so far.

## 2016-01-15 NOTE — Discharge Summary (Signed)
Discharge Summary  Tina FolksOlivia M Lane AOZ:[id number] DOB: [birth year]  PCP: Willow OraANDY,[name], MD  Admit date: 01/14/2016 Discharge date: 01/15/2016  Time spent: 25 minutes   Recommendations for Outpatient Follow-up:  1. Patient will follow up as needed with her primary physician   Discharge Diagnoses:  Active Hospital Problems   Diagnosis Date Noted  . Chest pain 01/14/2016  . Hypothyroidism 07/24/2012  Essential hypertension   Resolved Hospital Problems   Diagnosis Date Noted Date Resolved  No resolved problems to display.    Discharge Condition: Stable, being discharged home   Diet recommendation: Low-sodium  Filed Weights   01/14/16 2200 01/14/16 2227 01/15/16 0500  Weight: 63.05 kg (139 lb) 65.2 kg (143 lb 11.8 oz) 65.2 kg (143 lb 11.8 oz)    History of present illness:  Patient is a very pleasant 82 year old female past medical history of hypertension and hypothyroidism who resides in a nursing home facility. She mentioned to her family that she had an episode of chest pain described as heaviness nonradiating lasting for about 15 minutes and her family insisted that she come to the emergency room. Patient was sent over in the ER, noted to have normal labs including normal troponin. EKG unremarkable. No abnormal rhythms. She was brought in overnight for observation  Hospital Course:  Active Problems:   Hypothyroidism: Continue Synthroid   Chest pain: Enzymes 3 negative. Given her advanced age, the fact the patient is now chest pain-free and markers and EKG are normal, would not pursue this any further. Patient is amenable to this plan. We'll plan to return her back to her nursing facility Chronic diastolic heart failure: Noted on previous echocardiogram. She does not have congestive heart failure and again not sure the benefit of adjusting her medications putting her at risk for renal failure even by putting her on low-dose lisinopril. Blood pressure stable. Hypertension: Keep  blood pressure medications the same  Procedures:  None  Consultations:  None  Discharge Exam: BP 118/61 mmHg  Pulse 86  Temp(Src) 97.5 F (36.4 C) (Oral)  Resp 22  Ht 5\' 2"  (1.575 m)  Wt 65.2 kg (143 lb 11.8 oz)  BMI 26.28 kg/m2  SpO2 95%  General: Alert and oriented 2, no acute distress  Cardiovascular: Regular rate and rhythm, S1-S2  Respiratory: Clear to auscultation bilaterally   Discharge Instructions You were cared for by a hospitalist during your hospital stay. If you have any questions about your discharge medications or the care you received while you were in the hospital after you are discharged, you can call the unit and asked to speak with the hospitalist on call if the hospitalist that took care of you is not available. Once you are discharged, your primary care physician will handle any further medical issues. Please note that NO REFILLS for any discharge medications will be authorized once you are discharged, as it is imperative that you return to your primary care physician (or establish a relationship with a primary care physician if you do not have one) for your aftercare needs so that they can reassess your need for medications and monitor your lab values.  Discharge Instructions    Diet - low sodium heart healthy    Complete by:  As directed      Increase activity slowly    Complete by:  As directed             Medication List    TAKE these medications        acetaminophen  325 MG tablet  Commonly known as:  TYLENOL  Take 2 tablets (650 mg total) by mouth every 6 (six) hours as needed for mild pain.     amLODipine 5 MG tablet  Commonly known as:  NORVASC  Take 1 tablet (5 mg total) by mouth daily.     aspirin 81 MG EC tablet  Take 1 tablet (81 mg total) by mouth daily.     calcitonin (salmon) 200 UNIT/ACT nasal spray  Commonly known as:  MIACALCIN/FORTICAL  Place 1 spray into alternate nostrils daily.     CALCIUM 600 + D PO  Take 1 tablet  by mouth every morning.     calcium carbonate 750 MG chewable tablet  Commonly known as:  TUMS EX  Chew 1 tablet by mouth at bedtime as needed for heartburn.     levothyroxine 75 MCG tablet  Commonly known as:  SYNTHROID, LEVOTHROID  Take 75 mcg by mouth daily before breakfast.     magnesium hydroxide 400 MG/5ML suspension  Commonly known as:  MILK OF MAGNESIA  Give 30ml by mouth once daily as needed for constipation. Do not give for more than 2 consecutive days.     omeprazole 20 MG capsule  Commonly known as:  PRILOSEC  Take 20 mg by mouth daily.     polycarbophil 625 MG tablet  Commonly known as:  FIBERCON  Take 625 mg by mouth at bedtime.     polyethylene glycol packet  Commonly known as:  MIRALAX / GLYCOLAX  Take 17 g by mouth daily.     timolol 0.5 % ophthalmic solution  Commonly known as:  BETIMOL  Place 1 drop into both eyes at bedtime.     Vitamin D3 1000 units Caps  Take 1 capsule by mouth every morning.       Allergies  Allergen Reactions  . Latex Itching  . Onion Nausea And Vomiting  . Other     SPANDEX ALLERGY  . Penicillins Other (See Comments)    UNKNOWN REACTION  . Sulfa Antibiotics Other (See Comments)    UNKNOWN REACTION      The results of significant diagnostics from this hospitalization (including imaging, microbiology, ancillary and laboratory) are listed below for reference.    Significant Diagnostic Studies: Dg Chest 2 View  01/14/2016  CLINICAL DATA:  Chest pain EXAM: CHEST  2 VIEW COMPARISON:  01/28/2015 FINDINGS: Normal mediastinum and cardiac silhouette. Chronic central bronchitic markings. Normal pulmonary vasculature. No effusion, infiltrate, or pneumothorax. IMPRESSION: Chronic bronchitic markings.  No acute findings. Electronically Signed   By: Genevive Bi M.D.   On: 01/14/2016 16:35    Microbiology: Recent Results (from the past 240 hour(s))  MRSA PCR Screening     Status: None   Collection Time: 01/14/16 10:40 PM   Result Value Ref Range Status   MRSA by PCR NEGATIVE NEGATIVE Final    Comment:        The GeneXpert MRSA Assay (FDA approved for NASAL specimens only), is one component of a comprehensive MRSA colonization surveillance program. It is not intended to diagnose MRSA infection nor to guide or monitor treatment for MRSA infections.      Labs: Basic Metabolic Panel:  Recent Labs Lab 01/14/16 1609  NA 135  K 4.5  CL 97*  CO2 29  GLUCOSE 103*  BUN 27*  CREATININE 0.90  CALCIUM 9.8   Liver Function Tests: No results for input(s): AST, ALT, ALKPHOS, BILITOT, PROT, ALBUMIN in the last 168 hours.  No results for input(s): LIPASE, AMYLASE in the last 168 hours. No results for input(s): AMMONIA in the last 168 hours. CBC:  Recent Labs Lab 01/14/16 1609  WBC 8.6  NEUTROABS 4.4  HGB 13.8  HCT 42.6  MCV 90.3  PLT 357   Cardiac Enzymes:  Recent Labs Lab 01/14/16 1609 01/14/16 1945 01/14/16 2301  TROPONINI <0.03 <0.03 <0.03   BNP: BNP (last 3 results)  Recent Labs  01/15/16 1026  BNP 47.0    ProBNP (last 3 results) No results for input(s): PROBNP in the last 8760 hours.  CBG: No results for input(s): GLUCAP in the last 168 hours.     Signed:  Hollice Espy  [facility] 01/15/2016, 1:22 PM

## 2016-01-16 ENCOUNTER — Encounter: Payer: Self-pay | Admitting: Internal Medicine

## 2016-01-16 DIAGNOSIS — L03115 Cellulitis of right lower limb: Secondary | ICD-10-CM

## 2016-01-16 DIAGNOSIS — R079 Chest pain, unspecified: Secondary | ICD-10-CM

## 2016-01-16 NOTE — Progress Notes (Signed)
Patient ID: Tina Lane, female   DOB: 1912/03/22, 82 y.o.   MRN: [id number]  This encounter was created in error - please disregard.

## 2016-01-17 ENCOUNTER — Non-Acute Institutional Stay (SKILLED_NURSING_FACILITY): Payer: 59 | Admitting: Internal Medicine

## 2016-01-17 DIAGNOSIS — L03119 Cellulitis of unspecified part of limb: Secondary | ICD-10-CM

## 2016-01-17 DIAGNOSIS — R079 Chest pain, unspecified: Secondary | ICD-10-CM

## 2016-01-21 NOTE — Progress Notes (Signed)
Patient ID: Tina Lane, female   DOB: August 11, 1912, 82 y.o.   MRN: [id number]                 L  DATE:  01/16/2016                   FACILITY: [facility]      LEVEL OF CARE:  Skilled  This is a an acute visit   CHIEF COMPLAINT: -Acute visit status post overnight hospitalization for chest pain .    HISTORY OF PRESENT ILLNESS:  This is a patient who is 82 years old--she has a history of  HTN- CVA with minimal deficits-lumbar compression fracture-she recently been treated for possible right leg cellulitis as well.  Apparently over the weekend she complained of chest pain and was sent to the ER where she was hospitalized overnight for basically observation it appears.  Her troponin was negative BNP was unremarkable at 47. EKG was unremarkable.  Apparently there were no further episodes of chest pain and she was discharged back to nursing-no aggressive medical management was advised secondary to patient being asymptomatic and her advanced age.  Currently she is resting in bed comfortably does not complain of any chest pain she appears to be back at her baseline vital signs are stable.  Of note she does have a history of GERD she continues on Prilosec.  She also continues to have some mild erythema of her right lower extremity this is been followed by wound care as well as Dr. Leanord Hawking  No assault.  Marland Kitchen                      PAST MEDICAL HISTORY/PROBLEM LIST:                        Glaucoma.    Thyroid disease.     Hard of hearing.    Gastroesophageal reflux disease.    Pelvic fractures, as mentioned.    Chronic back pain.     PAST SURGICAL HISTORY:                        Hemorrhoid surgery.     Cataract extraction.    CURRENT MEDICATIONS:  Medication list is reviewed.                    Tylenol 650 q.6 p.r.n.           Miacalcin 1 spray, alternating nostrils daily.    Calcium carbonate 750, 1 tablet at bedtime as needed.    Calcium carbonate  plus D, 1 tablet every morning.    Vitamin D3, 1000 daily.     Synthroid 75 q.d.      Fibercon 625 at bedtime.    MiraLAX 17 g daily p.r.n.      Timolol 0.5% ophthalmic, 1 drop into both eyes at bedtime  Norvasc 5 mg QD.    She was taken off diclofenac, ibuprofen, and Norco in the hospital.     SOCIAL HISTORY:    I, this was an independent woman living in her own home in [place].  She went home, had assistance at home .  Again, her exact functional status until she deteriorated, which according to the history was shortly before she was admitted to hospital, is uncertain.    REVIEW OF SYSTEMS:  Obtained from nursing staff and patient.  In general does not  complain of any fever or chills.  Skin is not complaining of rashes or itching has numerous widespread solar induced changes however.-History of right leg cellulitis and venous stasis changes as noted above  Head ears eyes nose mouth and throat does not complain of any visual changes sore throat or dysphagia.  Respiratory denies any shortness breath or cough.  Cardiac no chest pain has some mild right lower leg edema.  GI-does not complaining of abdominal pain Or dysphagia  GU does not complain of dysuria.  Muscle skeletal does ambulate in a wheelchair is not complaining of joint pain.  Neurologic does not complain of dizziness headache or syncopal-type feelings.   psych is pleasant apparently confusion at times what is pleasant and appropriate today on exam.  Physical exam.  Temperature 98.3 pulse 97 respirations 22 blood pressure 107/62  In general this is a pleasant elderly female in no distress  Her skin is warm and dry has numerous solar induced changes most prominent over her arms and face. Has a history apparently of a venous stasis wound on her right leg  There is some erythema on the right leg this is confined to the lower leg  This is fairly persistent-somewhat warm to touch  Eyes pupils appear  reactive light sclera and conjunctiva are clear visual acuity appears grossly intact.  Oropharynx is clear mucous membranes moist.  Chest is clear to auscultation there is no labored breathing.  Heart is regular rate and rhythm without murmur gallop or rub she has vevous statis changesthere are  positive pedal pulses.  Abdomen is soft nontender there are active bowel sounds.  Muscle skeletal does move all extremities 4 largely ambulates in a wheelchair during the day I do not note any deformities.  Neurologic is grossly intact no lateralizing findings her speech is clear.  Psych she is alert and grossly oriented apparently she does have some periods of confusion but she is pleasant and appropriate follow verbal commands without difficulty  Labs  01/15/2016.  BNP 47.  01/14/2016.  Troponin less than 0.03.  WBC 8.6 hemoglobin 13.8 platelets 357.  Sodium 135 potassium 4.5 BUN 27 creatinine 0.9.  12/14/2015.  Sodium 137 potassium 4.1 BUN 24 creatinine 0.8 CO2 level was 30.  ALT 11 otherwise liver function tests within normal limits.  WBC 5.6 hemoglobin 14.3 platelets 343  08/04/2015.  WBC 7.2 hemoglobin 13.9 platelets 339.  Sodium 132 potassium 4.6 BUN 26 creatinine 0.9.  ALT 10-otherwise liver function tests within normal limits  TSH 2.414.  Vitamin D-48  02/06/2015.  Sodium 138 potassium 3.8 BUN 28 creatinine 0.72.  01/30/2015.  Hemoglobin A1c 5.8.  Cholesterol 105-triglycerides 78-HDL 44-LDL 45.  01/29/2015 TSH was 1.344.  Albumin 3.6.  01/28/2015.  WBC 5.9 hemoglobin 12.1 platelets 360         ASSESSMENT/PLAN  Chest pain-unclear etiology-there's been no recurrent-patient appears to be at her baseline-at this point will monitor no aggressive intervention per consultation in hospital-she does have a history GERD and continues on Prilosec Hypertension appears controlled on Norvasc.  Right leg erythema-? cellulitis-will empirically restart  doxycycline secondary to its persistence-however will warrant reevaluation Dr. Leanord Hawking will be looking at this tomorrow will await his input:                      [phone]

## 2016-02-15 ENCOUNTER — Inpatient Hospital Stay
Admission: RE | Admit: 2016-02-15 | Discharge: 2016-08-09 | Disposition: A | Discharge status: Nursing Home (Includes ICF) | Payer: 59 | Source: Ambulatory Visit | Attending: Internal Medicine | Admitting: Internal Medicine

## 2016-02-16 ENCOUNTER — Encounter (HOSPITAL_COMMUNITY)
Admission: AD | Admit: 2016-02-16 | Discharge: 2016-02-16 | Disposition: A | Discharge status: Home / Self Care | Payer: 59 | Source: Skilled Nursing Facility | Attending: Internal Medicine | Admitting: Internal Medicine

## 2016-02-16 DIAGNOSIS — D519 Vitamin B12 deficiency anemia, unspecified: Secondary | ICD-10-CM | POA: Diagnosis present

## 2016-02-16 DIAGNOSIS — E039 Hypothyroidism, unspecified: Secondary | ICD-10-CM | POA: Insufficient documentation

## 2016-02-16 LAB — TSH: TSH: 3.935 u[IU]/mL (ref 0.350–4.500)

## 2016-02-17 LAB — VITAMIN D 25 HYDROXY (VIT D DEFICIENCY, FRACTURES): Vit D, 25-Hydroxy: 50.3 ng/mL (ref 30.0–100.0)

## 2016-03-03 ENCOUNTER — Encounter (HOSPITAL_COMMUNITY)
Admission: RE | Admit: 2016-03-03 | Discharge: 2016-03-03 | Disposition: A | Discharge status: Home / Self Care | Payer: 59 | Source: Skilled Nursing Facility | Attending: Internal Medicine | Admitting: Internal Medicine

## 2016-03-03 DIAGNOSIS — H409 Unspecified glaucoma: Secondary | ICD-10-CM | POA: Insufficient documentation

## 2016-03-03 DIAGNOSIS — R079 Chest pain, unspecified: Secondary | ICD-10-CM | POA: Diagnosis not present

## 2016-03-03 DIAGNOSIS — N390 Urinary tract infection, site not specified: Secondary | ICD-10-CM | POA: Diagnosis not present

## 2016-03-03 DIAGNOSIS — Z8781 Personal history of (healed) traumatic fracture: Secondary | ICD-10-CM | POA: Diagnosis not present

## 2016-03-03 DIAGNOSIS — Z9181 History of falling: Secondary | ICD-10-CM | POA: Diagnosis not present

## 2016-03-03 DIAGNOSIS — E039 Hypothyroidism, unspecified: Secondary | ICD-10-CM | POA: Insufficient documentation

## 2016-03-03 DIAGNOSIS — I639 Cerebral infarction, unspecified: Secondary | ICD-10-CM | POA: Diagnosis not present

## 2016-03-03 LAB — URINALYSIS, ROUTINE W REFLEX MICROSCOPIC
BILIRUBIN URINE: NEGATIVE
Glucose, UA: NEGATIVE mg/dL
KETONES UR: NEGATIVE mg/dL
Leukocytes, UA: NEGATIVE
NITRITE: NEGATIVE
Protein, ur: NEGATIVE mg/dL
SPECIFIC GRAVITY, URINE: 1.01 (ref 1.005–1.030)
pH: 7 (ref 5.0–8.0)

## 2016-03-03 LAB — URINE MICROSCOPIC-ADD ON: WBC UA: NONE SEEN WBC/hpf (ref 0–5)

## 2016-03-04 ENCOUNTER — Non-Acute Institutional Stay (SKILLED_NURSING_FACILITY): Payer: 59 | Admitting: Internal Medicine

## 2016-03-04 DIAGNOSIS — M810 Age-related osteoporosis without current pathological fracture: Secondary | ICD-10-CM | POA: Diagnosis not present

## 2016-03-04 DIAGNOSIS — K219 Gastro-esophageal reflux disease without esophagitis: Secondary | ICD-10-CM | POA: Diagnosis not present

## 2016-03-04 DIAGNOSIS — S3289XD Fracture of other parts of pelvis, subsequent encounter for fracture with routine healing: Secondary | ICD-10-CM

## 2016-03-04 DIAGNOSIS — E038 Other specified hypothyroidism: Secondary | ICD-10-CM

## 2016-03-04 DIAGNOSIS — M5489 Other dorsalgia: Secondary | ICD-10-CM

## 2016-03-04 NOTE — Progress Notes (Signed)
Patient ID: Tina Lane, female   DOB: 19-Feb-1912, 82 y.o.   MRN: [id number]                         This is a routine visit                      FACILITY: [facility]      LEVEL OF CARE:  Skilled  This is a routine visit   CHIEF COMPLAINT: -medical management of chronic issues including pelvic fracture hypothyroidism osteoporosis hypertension-- .    HISTORY OF PRESENT ILLNESS:  This is a patient who is 82 years old.  We actually had her in the facility in October, at which time she had fallen and fractured her right inferior and superior pubic rami.  She lives on her own in [place].  She apparently went home and had somebody staying with her for a while...    She was apparently found wandering at home in her yard.  She apparently had had a worsening of her mental status for several days.  She did not have evidence of systemic infection.  :  Work-up for her altered mental status was extensive and included vitamin B12, TSH, sedimentation rate, RPR, MRI of the brain which indicated a left parietal infarct.    She had arterial dopplers of her carotids that did not show any significant stenosis on the left.  On the right, this was abnormal although it was not felt that she would be a vascular surgery candidate.    CT scan of the abdomen and pelvis was done on 01/10/2015 that showed endplate compression fracture of L2.  There were no lytic or sclerotic lesions.      .    As noted, her MRI showed a small acute infarct in the left parietal cortex.      Her delirium resolved and she was apparently placed here for long-term placement-h  She has had at times right lower leg cellulitis which has responded to antibiotics.  She also at times has complained of chest pain this is thought to be more GERD GI related back in January she did have an overnight hospitalization for monitoring but workup was essentially negative she is on Prilosec this appears to stabilize  somewhat but appears to flareup at times.  Most acute issue today is back pain again she does have a history of compression fractures she is on Tylenol as needed-there's been no recent fall  to my knowledge                      PAST MEDICAL HISTORY/PROBLEM LIST:                        Glaucoma.    Thyroid disease.     Hard of hearing.    Gastroesophageal reflux disease.    Pelvic fractures, as mentioned.    Chronic back pain.     PAST SURGICAL HISTORY:                        Hemorrhoid surgery.     Cataract extraction.    CURRENT MEDICATIONS:  Medication list is reviewed.                    Tylenol 650 q.6 p.r.n.           Miacalcin 1  spray, alternating nostrils daily.    Calcium carbonate 750, 1 tablet at bedtime as needed.    Calcium carbonate plus D, 1 tablet every morning.    Vitamin D3, 1000 daily.     Synthroid 75 q.d.      Fibercon 625 at bedtime.    MiraLAX 17 g daily p.r.n.      Timolol 0.5% ophthalmic, 1 drop into both eyes at bedtime  Norvasc 5 mg QD.    She was taken off diclofenac, ibuprofen, and Norco in the hospital.     SOCIAL HISTORY:    I.  Prior to October, this was an independent woman living in her own home in [place].  She went home, had assistance at home .  Again, her exact functional status until she deteriorated, which according to the history was shortly before she was admitted to hospital, is uncertain.    REVIEW OF SYSTEMS:  Obtained from nursing staff and patient.  In general does not complain of any fever or chills.  Skin is not complaining of rashes or itching has numerous widespread solar induced changes however.-History of right leg cellulitis and venous stasis changes  Head ears eyes nose mouth and throat does not complain of any visual changes sore throat or dysphagia.  Respiratory denies any shortness breath or cough.  Cardiac no chest pain has some mild right lower leg edema.  GI-does not complaining of  abdominal pain Or dysphagia  GU does not complain of dysuria.  Muscle skeletal does ambulate in a wheelchair has had recent complaints of increased back discomfort.  Neurologic does not complain of dizziness headache or syncopal-type feelings.   psych is pleasant apparently confusion at times what is pleasant and appropriate  Physical exam.  Temperature 97.9 pulse 88 respirations 20 blood pressure 144/81-112/68 in this range weight is stable at 147  In general this is a pleasant elderly female in no distress sitting comfortably in her wheelchair  Her skin is warm and dry has numerous solar induced changes most prominent over her arms and face. Has a history apparently of a venous stasis wound on her right leg   Erythema right leg appears to be stable  Per nursing staff more venous stasis related   Eyes pupils appear reactive light sclera and conjunctiva are clear visual acuity appears grossly intact.  Oropharynx is clear mucous membranes moist.  Chest largely clear to auscultation with some bronchial sounds there is no labored breathing.  Heart is regular rate and rhythm without murmur gallop or rub she has vevous statis changesthere are  positive pedal pulses.  Abdomen is soft nontender there are active bowel sounds  GU-no overt suprapubic tenderness.  Muscle skeletal does move all extremities 4 largely ambulates in a wheelchair during the day -she has some mild tenderness to palpation of her back there are arthritic changes -bony prominence changes most noticeable upper back--these appear to be chronic  Neurologic is grossly intact no lateralizing findings her speech is clear.  Psych she is alert and grossly oriented apparently she does have some periods of confusion but she is pleasant and appropriate follow verbal commands without difficulty  Labs.  02/16/2016.  WUJ-8.119.  Vitamin D is 50.3.  BNP 47.  01/14/2016.  Sodium 135 potassium 4.5 BUN 27 creatinine  0.9.  WBC 8.6 hemoglobin 13.8 platelets 357.    12/14/2015.  Sodium 137 potassium 4.1 BUN 24 creatinine 0.8 CO2 level was 30.  ALT 11 otherwise liver function tests within normal limits.  WBC 5.6 hemoglobin 14.3 platelets 343  08/04/2015.  WBC 7.2 hemoglobin 13.9 platelets 339.  Sodium 132 potassium 4.6 BUN 26 creatinine 0.9.  ALT 10-otherwise liver function tests within normal limits  TSH 2.414.  Vitamin D-48  02/06/2015.  Sodium 138 potassium 3.8 BUN 28 creatinine 0.72.  01/30/2015.  Hemoglobin A1c 5.8.  Cholesterol 105-triglycerides 78-HDL 44-LDL 45.  01/29/2015 TSH was 1.344.  Albumin 3.6.  01/28/2015.  WBC 5.9 hemoglobin 12.1 platelets 360         ASSESSMENT/PLAN:                   #1- Suspect GERD-this appears relatively stable on Prilosec-again she did go to the hospital for short stay 2 months ago for chest pain thought to be non-cardiac at this point will monitor     #2 history CVA she is on aspirin appears to have fairly minimal deficits here she has actually done quite well here the initial delirium appears to have resolved fairly quickly and has not returned since shortly after her admission.  Hypothyroidism she is on Synthroid recent TSH in February was within normal limits at 3.935  #4 history of hypertension This appears stable she is on low dose Norvasc at 5 mg a day recent blood pressures  144/81-124/64-112/68  #5 as history of pelvic fracture this appears to have healed unremarkably she does have osteoporosis she is on calcium supplementation  #6-history of back pain she does have a history compression fractures as noted above--we will x-ray her thoracic lumbar sacral spine as well as her hips bilaterally-she may need additional pain medication and addition to Tylenol apparently she does not do well with narcotics which complicates matters at this point will watch closely clinically encourage Tylenol use and reevaluate as  needed  Family has requested an order to DC therapy we will do that  She also has a urine culture pending-urinalysis at this point is negative for nitrites leukocytes few bacteria.  #7 history constipation apparently this has not been an issue she is on MiraLAX.  Will update CBC and CMP tomorrow for updated values         [id number]   .                     CLINICAL DATA:  Altered mental status   EXAM: MRI HEAD WITHOUT CONTRAST   TECHNIQUE: Multiplanar, multiecho pulse sequences of the brain and surrounding structures were obtained without intravenous contrast.   COMPARISON:  CT head 01/28/2015   FINDINGS: Moderate atrophy. Negative for hydrocephalus. Pituitary normal in size.   Small acute infarct in the left parietal cortex measuring less than 1 cm. No other acute infarct.   Chronic microvascular ischemic changes in the white matter, mild to moderate in degree. Brainstem and cerebellum intact.   Negative for hemorrhage or fluid collection.   Negative for mass or edema.  No shift of the midline structures.   Paranasal sinuses clear.   IMPRESSION: Small acute infarct left parietal cortex likely in the precentral cortex.   Atrophy and chronic microvascular ischemia.     Electronically Signed   By: Marlan Palauharles  Clark M.D.   On: 01/30/2015 10:58

## 2016-03-05 ENCOUNTER — Other Ambulatory Visit: Payer: Self-pay | Admitting: Internal Medicine

## 2016-03-05 ENCOUNTER — Encounter (HOSPITAL_COMMUNITY)
Admission: RE | Admit: 2016-03-05 | Discharge: 2016-03-05 | Disposition: A | Discharge status: Home / Self Care | Payer: 59 | Source: Skilled Nursing Facility | Attending: Internal Medicine | Admitting: Internal Medicine

## 2016-03-05 ENCOUNTER — Ambulatory Visit (HOSPITAL_COMMUNITY)
Admission: RE | Admit: 2016-03-05 | Discharge: 2016-03-05 | Disposition: A | Discharge status: Home / Self Care | Payer: 59 | Source: Ambulatory Visit | Attending: Internal Medicine | Admitting: Internal Medicine

## 2016-03-05 DIAGNOSIS — M8588 Other specified disorders of bone density and structure, other site: Secondary | ICD-10-CM | POA: Diagnosis not present

## 2016-03-05 DIAGNOSIS — M8448XA Pathological fracture, other site, initial encounter for fracture: Secondary | ICD-10-CM | POA: Insufficient documentation

## 2016-03-05 DIAGNOSIS — M160 Bilateral primary osteoarthritis of hip: Secondary | ICD-10-CM | POA: Diagnosis not present

## 2016-03-05 DIAGNOSIS — M546 Pain in thoracic spine: Secondary | ICD-10-CM | POA: Diagnosis not present

## 2016-03-05 DIAGNOSIS — R52 Pain, unspecified: Secondary | ICD-10-CM

## 2016-03-05 DIAGNOSIS — M545 Low back pain: Secondary | ICD-10-CM | POA: Diagnosis not present

## 2016-03-05 DIAGNOSIS — M25551 Pain in right hip: Secondary | ICD-10-CM | POA: Insufficient documentation

## 2016-03-05 DIAGNOSIS — N390 Urinary tract infection, site not specified: Secondary | ICD-10-CM | POA: Diagnosis not present

## 2016-03-05 LAB — COMPREHENSIVE METABOLIC PANEL
ALK PHOS: 67 U/L (ref 38–126)
ALT: 17 U/L (ref 14–54)
AST: 20 U/L (ref 15–41)
Albumin: 4 g/dL (ref 3.5–5.0)
Anion gap: 9 (ref 5–15)
BUN: 20 mg/dL (ref 6–20)
CHLORIDE: 96 mmol/L — AB (ref 101–111)
CO2: 30 mmol/L (ref 22–32)
Calcium: 9.6 mg/dL (ref 8.9–10.3)
Creatinine, Ser: 0.89 mg/dL (ref 0.44–1.00)
GFR calc Af Amer: 59 mL/min — ABNORMAL LOW (ref >60)
GFR calc non Af Amer: 51 mL/min — ABNORMAL LOW (ref >60)
Glucose, Bld: 103 mg/dL — ABNORMAL HIGH (ref 65–99)
Potassium: 4.2 mmol/L (ref 3.5–5.1)
SODIUM: 135 mmol/L (ref 135–145)
TOTAL PROTEIN: 8 g/dL (ref 6.5–8.1)
Total Bilirubin: 0.7 mg/dL (ref 0.3–1.2)

## 2016-03-05 LAB — URINE CULTURE: CULTURE: NO GROWTH

## 2016-03-05 LAB — CBC WITH DIFFERENTIAL/PLATELET
BASOS ABS: 0.1 10*3/uL (ref 0.0–0.1)
BASOS PCT: 2 %
EOS ABS: 0.3 10*3/uL (ref 0.0–0.7)
Eosinophils Relative: 5 %
HCT: 45.6 % (ref 36.0–46.0)
HEMOGLOBIN: 14.8 g/dL (ref 12.0–15.0)
LYMPHS ABS: 2 10*3/uL (ref 0.7–4.0)
Lymphocytes Relative: 36 %
MCH: 29.2 pg (ref 26.0–34.0)
MCHC: 32.5 g/dL (ref 30.0–36.0)
MCV: 90.1 fL (ref 78.0–100.0)
Monocytes Absolute: 0.4 10*3/uL (ref 0.1–1.0)
Monocytes Relative: 7 %
NEUTROS PCT: 50 %
Neutro Abs: 2.8 10*3/uL (ref 1.7–7.7)
PLATELETS: 344 10*3/uL (ref 150–400)
RBC: 5.06 MIL/uL (ref 3.87–5.11)
RDW: 14.4 % (ref 11.5–15.5)
WBC: 5.7 10*3/uL (ref 4.0–10.5)

## 2016-03-09 ENCOUNTER — Encounter: Payer: Self-pay | Admitting: Internal Medicine

## 2016-03-09 DIAGNOSIS — M549 Dorsalgia, unspecified: Secondary | ICD-10-CM | POA: Insufficient documentation

## 2016-04-05 ENCOUNTER — Non-Acute Institutional Stay (SKILLED_NURSING_FACILITY): Payer: 59 | Admitting: Internal Medicine

## 2016-04-05 ENCOUNTER — Encounter: Payer: Self-pay | Admitting: Internal Medicine

## 2016-04-05 DIAGNOSIS — K219 Gastro-esophageal reflux disease without esophagitis: Secondary | ICD-10-CM

## 2016-04-05 DIAGNOSIS — S3289XD Fracture of other parts of pelvis, subsequent encounter for fracture with routine healing: Secondary | ICD-10-CM | POA: Diagnosis not present

## 2016-04-05 DIAGNOSIS — E038 Other specified hypothyroidism: Secondary | ICD-10-CM

## 2016-04-05 DIAGNOSIS — M810 Age-related osteoporosis without current pathological fracture: Secondary | ICD-10-CM

## 2016-04-05 NOTE — Progress Notes (Signed)
Patient ID: Tina Lane, female   DOB: 1912/04/20, 82 y.o.   MRN: [id number]  Location:  [facility]   Place of Service:  SNF (31)   Tina Ora, MD  Patient Care Team: Tina Ora, MD as PCP - General [facility] Medicine)  Extended Emergency Contact Information Primary Emergency Contact: Tina Lane Address: [address]          [place], [place] Home Phone: [phone] Mobile Phone: [phone] Relation: Son   Goals of care: Advanced Directive information Advanced Directives 04/05/2016  Does patient have an advance directive? Yes  Type of Advance Directive Out of facility DNR (pink MOST or yellow form)  Does patient want to make changes to advanced directive? No - Patient declined  Copy of advanced directive(s) in chart? Yes     Chief Complaint  Patient presents with  . Medical Management of Chronic Issues    HPI:  Pt is a 82 y.o. female seen here for chronic medical issues including pelvic fracture hypothyroidism osteoporosis and hypertension    We actually had her in the facility in October, at which time she had fallen and fractured her right inferior and superior pubic rami. She lives on her own in [place]. She apparently went home and had somebody staying with her for a while...   She was apparently found wandering at home in her yard. She apparently had had a worsening of her mental status for several days. She did not have evidence of systemic infection. : Work-up for her altered mental status was extensive and included vitamin B12, TSH, sedimentation rate, RPR, MRI of the brain which indicated a left parietal infarct.   She had arterial dopplers of her carotids that did not show any significant stenosis on the left. On the right, this was abnormal although it was not felt that she would be a vascular surgery candidate.   CT scan of the abdomen and pelvis was done on 01/10/2015 that showed endplate  compression fracture of L2. There were no lytic or sclerotic lesions.    .   As noted, her MRI showed a small acute infarct in the left parietal cortex.     Her delirium resolved and she was apparently placed here for long-term placement-h  She has had at times right lower leg cellulitis which has responded to antibiotics.  She also at times has complained of chest pain this is thought to be more GERD GI related back in January she did have an overnight hospitalization for monitoring but workup was essentially negative she is on Prilosec this appears to stabilize somewhat but appears to flareup at times.   She does have a history of compression fractures of her spine she is on Tylenol apparently this is helping.  Today she has no acute complaints      Past Medical History  Diagnosis Date  . Glaucoma   . Thyroid disease   . GERD (gastroesophageal reflux disease)   . HOH (hard of hearing)   . Pelvic fracture (HCC)     snf  . Back pain   . Confusion   . DNR (do not resuscitate)    Past Surgical History  Procedure Laterality Date  . Abdominal surgery    . Hemorrhoid surgery    . Cataract extraction      Allergies  Allergen Reactions  . Latex Itching  . Onion Nausea And Vomiting  . Other     SPANDEX ALLERGY  .  Penicillins Other (See Comments)    UNKNOWN REACTION  . Sulfa Antibiotics Other (See Comments)    UNKNOWN REACTION      Medication List       This list is accurate as of: 04/05/16  3:41 PM.  Always use your most recent med list.               acetaminophen 325 MG tablet  Commonly known as:  TYLENOL  Take 2 tablets (650 mg total) by mouth every 6 (six) hours as needed for mild pain.     amLODipine 5 MG tablet  Commonly known as:  NORVASC  Take 1 tablet (5 mg total) by mouth daily.     aspirin 81 MG EC tablet  Take 1 tablet (81 mg total) by mouth daily.     calcitonin (salmon) 200 UNIT/ACT nasal spray  Commonly known as:   MIACALCIN/FORTICAL  Place 1 spray into alternate nostrils daily.     CALCIUM 600 + D PO  Take 1 tablet by mouth every morning.     calcium carbonate 750 MG chewable tablet  Commonly known as:  TUMS EX  Chew 1 tablet by mouth at bedtime as needed for heartburn.     cetaphil cream  Apply topical to legs lightly  twice a day     ibuprofen 200 MG tablet  Commonly known as:  ADVIL,MOTRIN  Take 200 mg by mouth daily.     ibuprofen 200 MG tablet  Commonly known as:  ADVIL,MOTRIN  Take 200 mg by mouth every 6 (six) hours as needed.     levothyroxine 75 MCG tablet  Commonly known as:  SYNTHROID, LEVOTHROID  Take 75 mcg by mouth daily before breakfast.     magnesium hydroxide 400 MG/5ML suspension  Commonly known as:  MILK OF MAGNESIA  Give 30ml by mouth once daily as needed for constipation. Do not give for more than 2 consecutive days.     polycarbophil 625 MG tablet  Commonly known as:  FIBERCON  Take 625 mg by mouth at bedtime.     polyethylene glycol packet  Commonly known as:  MIRALAX / GLYCOLAX  Take 17 g by mouth daily.     ranitidine 150 MG tablet  Commonly known as:  ZANTAC  Take 150 mg by mouth at bedtime.     timolol 0.5 % ophthalmic solution  Commonly known as:  BETIMOL  Place 1 drop into both eyes at bedtime.     triamcinolone cream 0.1 %  Commonly known as:  KENALOG  Apply twice a day to buttocks and groin for dry inflamed rash     Vitamin D3 1000 units Caps  Take 1 capsule by mouth every morning.        Review of Systems  General no complaints of fever or chills.  Skin does not complain of rashes or itching does have numerous solar induced changes.  Head ears eyes nose mouth and throat does not complaining of any sore throat or visual changes.  Respiratory no complaints of shortness of breath or cough.  Cardiac denies any chest pain does at times have chest discomfort in the past it was thought to be more GERD related.  GI he is not complaining  of nausea vomiting diarrhea constipation or abdominal discomfort.  GU no complaints of dysuria.  Musculoskeletal again does have a history of pelvic and compression fractures but pain at this point appears to be relatively well controlled-I don't believe she does well with  stronger pain medication she is on Tylenol.  Neurologic does not complaining of dizziness headache or numbness.  In psych does not exhibit any depression continues to be pleasant and upbeat  Immunization History  Administered Date(s) Administered  . Influenza-Unspecified 10/05/2014   Pertinent  Health Maintenance Due  Topic Date Due  . DEXA SCAN  04/14/1979  . PNA vac Low Risk Adult (1 of 2 - PCV13) 04/14/1979  . INFLUENZA VACCINE  07/30/2016       Filed Vitals:   04/05/16 1341  BP: 103/67  Pulse: 87  Temp: 97.7 F (36.5 C)  TempSrc: Oral  Resp: 20  Height:  (1.575 m)  Weight: 143 lb 11.8 oz (65.2 kg)   Body mass index is 26.28 kg/(m^2). Physical Exam    Labs reviewed:  Recent Labs  12/14/15 0815 01/14/16 1609 03/05/16 0800  NA 137 135 135  K 4.1 4.5 4.2  CL 98* 97* 96*  CO2 GLUCOSE 121* 103* 103*  BUN 24* 27* 20  CREATININE 0.80 0.90 0.89  CALCIUM 9.6 9.8 9.6    Recent Labs  08/04/15 1638 12/14/15 0815 03/05/16 0800  AST ALT 10* 11* 17  ALKPHOS 60 62 67  BILITOT 0.3 0.4 0.7  PROT 7.5 7.8 8.0  ALBUMIN 3.8 3.9 4.0    Recent Labs  12/14/15 0815 01/14/16 1609 03/05/16 0800  WBC 5.6 8.6 5.7  NEUTROABS 3.0 4.4 2.8  HGB 14.3 13.8 14.8  HCT 43.2 42.6 45.6  MCV 90.8 90.3 90.1  PLT 343 357 344   Lab Results  Component Value Date   TSH 3.935 02/16/2016   Lab Results  Component Value Date   HGBA1C 5.8* 01/30/2015   Lab Results  Component Value Date   CHOL 105 01/30/2015   HDL 44 01/30/2015   LDLCALC 45 01/30/2015   TRIG 78 01/30/2015   CHOLHDL 2.4 01/30/2015      Assessment/Plan

## 2016-04-07 ENCOUNTER — Encounter: Payer: Self-pay | Admitting: Internal Medicine

## 2016-04-07 NOTE — Progress Notes (Signed)
Patient ID: Tina Lane, female   DOB: 10-15-1912, [age] y.o.   MRN: [id number]  This encounter was created in error - please disregard.

## 2016-04-07 NOTE — Progress Notes (Signed)
Patient ID: Tina Lane, female   DOB: 1912-12-10, [age] y.o.   MRN: [id number]                        The date is 04/05/2016   This is a routine visit                      FACILITY: [facility]      LEVEL OF CARE:  Skilled  This is a routine visit   CHIEF COMPLAINT: -medical management of chronic issues including pelvic fracture hypothyroidism osteoporosis hypertension-- .    HISTORY OF PRESENT ILLNESS:  This is a patient who is 82 years old.  We actually had her in the facility in October, at which time she had fallen and fractured her right inferior and superior pubic rami.  [place] on her own [place].  She apparently went home and had somebody staying with her for a while...    She was apparently found wandering at home in her yard.  She apparently had had a worsening of her mental status for several days.  She did not have evidence of systemic infection.  :  Work-up for her altered mental status was extensive and included vitamin B12, TSH, sedimentation rate, RPR, MRI of the brain which indicated a left parietal infarct.    She had arterial dopplers of her carotids that did not show any significant stenosis on the left.  On the right, this was abnormal although it was not felt that she would be a vascular surgery candidate.    CT scan of the abdomen and pelvis was done on 01/10/2015 that showed endplate compression fracture of L2.  There were no lytic or sclerotic lesions.      .    As noted, her MRI showed a small acute infarct in the left parietal cortex.      Her delirium resolved and she was apparently placed here for long-term placement-h  She has had at times right lower leg cellulitis which has responded to antibiotics.  She also at times has complained of chest pain this is thought to be more GERD GI related back in January she did have an overnight hospitalization for monitoring but workup was essentially negative she is on Prilosec  this appears to stabilize somewhat but appears to flareup at times.   She does have a history of compression fractures of her spine-she has been started on low-dose ibuprofen 200 mg a day-Tylenol apparently was not effective again challenging situation she does not do well with narcotics so we started on  low dose of NSAID she appears to be tolerating this well and it is helping  Today she has no acute complaints she is sitting in her wheelchair comfortably continues to be bright alert interactive                      PAST MEDICAL HISTORY/PROBLEM LIST:                        Glaucoma.    Thyroid disease.     Hard of hearing.    Gastroesophageal reflux disease.    Pelvic fractures, as mentioned.    Chronic back pain.     PAST SURGICAL HISTORY:                        Hemorrhoid surgery.  Cataract extraction.    CURRENT MEDICATIONS:  Medication list is reviewed.                    Tylenol 650 q.6 p.r.n.           Miacalcin 1 spray, alternating nostrils daily.    Calcium carbonate 750, 1 tablet at bedtime as needed.    Calcium carbonate plus D, 1 tablet every morning.    Vitamin D3, 1000 daily.     Synthroid 75 q.d.      Fibercon 625 at bedtime.    MiraLAX 17 g daily p.r.n.      Timolol 0.5% ophthalmic, 1 drop into both eyes at bedtime  Norvasc 5 mg QD.    Ibuprofen 200 mg daily  She was taken off diclofenac, ibuprofen, and Norco in the hospital.     SOCIAL HISTORY:    I.  Prior to October, this was an independent woman living in her own home in [place].  She went home, had assistance at home .  Again, her exact functional status until she deteriorated, which according to the history was shortly before she was admitted to hospital, is uncertain.    REVIEW OF SYSTEMS:  Obtained from nursing staff and patient.  In general does not complain of any fever or chills.  Skin is not complaining of rashes or itching has numerous widespread solar induced  changes however.-History of right leg cellulitis and venous stasis changes  Head ears eyes nose mouth and throat does not complain of any visual changes sore throat or dysphagia.  Respiratory denies any shortness breath or cough.  Cardiac no chest pain has some mild right lower leg edema. That is chronic  GI-does not complaining of abdominal pain Or dysphagia  GU does not complain of dysuria.  Muscle skeletal does ambulate in a wheelchair at times will complain of back pain but apparently this is stabilized somewhat  Neurologic does not complain of dizziness headache or syncopal-type feelings.   psych is pleasant apparently confusion at times what is pleasant and appropriate   Physical exam.  Temperature 97.4 pulse 87 respirations 18 blood pressure 127/65 weight is 143.4  In general this is a pleasant elderly female in no distress sitting comfortably in her wheelchair  Her skin is warm and dry has numerous solar induced changes most prominent over her arms and face. Has a venous stasis changes of her legs bilaterally  Erythema right leg appears to be stable  Per nursing staff more venous stasis related   Eyes pupils appear reactive light sclera and conjunctiva are clear visual acuity appears grossly intact.  Oropharynx is clear mucous membranes moist.  Chest largely clear to auscultation -- there is no labored breathing.  Heart is regular rate and rhythm without murmur gallop or rub she has vevous statis changesthere are  positive pedal pulses.  Abdomen is soft nontender there are active bowel sounds  GU-no overt suprapubic tenderness.  Muscle skeletal does move all extremities 4 largely ambulates in a wheelchair during the day -she has some mild tenderness to palpation of her back there are arthritic changes -bony prominence changes most noticeable upper back--these appear to be chronic  Neurologic is grossly intact no lateralizing findings her speech is clear.  Psych  she is alert and grossly oriented apparently she does have some periods of confusion but she is pleasant and appropriate follow verbal commands without difficulty  Labs  03/06/2015.  WBC 5.7 hemoglobin 14.8 platelets 344.  Sodium 135 potassium 4.2 BUN 20 creatinine 0.89.  Liver function tests within normal limits.  02/16/2016.  TSH-3.93.   Marland Kitchen  Vitamin D is 50.3.  BNP 47.  01/14/2016.  Sodium 135 potassium 4.5 BUN 27 creatinine 0.9.  WBC 8.6 hemoglobin 13.8 platelets 357.    12/14/2015.  Sodium 137 potassium 4.1 BUN 24 creatinine 0.8 CO2 level was 30.  ALT 11 otherwise liver function tests within normal limits.  WBC 5.6 hemoglobin 14.3 platelets 343  08/04/2015.  WBC 7.2 hemoglobin 13.9 platelets 339.  Sodium 132 potassium 4.6 BUN 26 creatinine 0.9.  ALT 10-otherwise liver function tests within normal limits  TSH 2.414.  Vitamin D-48  02/06/2015.  Sodium 138 potassium 3.8 BUN 28 creatinine 0.72.  01/30/2015.  Hemoglobin A1c 5.8.  Cholesterol 105-triglycerides 78-HDL 44-LDL 45.  01/29/2015 TSH was 1.344.  Albumin 3.6.  01/28/2015.  WBC 5.9 hemoglobin 12.1 platelets 360         ASSESSMENT/PLAN:                   #1- Suspect GERD-this appears relatively stable on ranitidine-again she did go to the hospital for short stay 2 months ago for chest pain thought to be non-cardiac at this point will monitor--she has been switched from a PPI I believe secondary to pharmacy recommendation-will monitor at this point current medication appears to be effective     #2 history CVA she is on aspirin appears to have fairly minimal deficits here she has actually done quite well here the initial delirium appears to have resolved fairly quickly and has not returned since shortly after her admission.  Hypothyroidism she is on Synthroid recent TSH in February was within normal limits at 3.935  #4 history of hypertension This appears stable she is on low  dose Norvasc at 5 mg a day recent blood pressures  127/65-134/60  #5 as history of pelvic fracture this appears to have healed unremarkably she does have osteoporosis she is on calcium supplementation  #6-history of back pain she does have a history compression fractures as noted above--low-dose ibuprofen has been started this appears to be helping she does not do well with narcotic she has Tylenol when necessary as well a--appears o be more comfortable today      #7 history constipation apparently this has not been an issue she is on MiraLAX.  Will update CBC and CMP tomorrow for updated values         [id number]   .                     CLINICAL DATA:  Altered mental status   EXAM: MRI HEAD WITHOUT CONTRAST   TECHNIQUE: Multiplanar, multiecho pulse sequences of the brain and surrounding structures were obtained without intravenous contrast.   COMPARISON:  CT head 01/28/2015   FINDINGS: Moderate atrophy. Negative for hydrocephalus. Pituitary normal in size.   Small acute infarct in the left parietal cortex measuring less than 1 cm. No other acute infarct.   Chronic microvascular ischemic changes in the white matter, mild to moderate in degree. Brainstem and cerebellum intact.   Negative for hemorrhage or fluid collection.   Negative for mass or edema.  No shift of the midline structures.   Paranasal sinuses clear.   IMPRESSION: Small acute infarct left parietal cortex likely in the precentral cortex.   Atrophy and chronic microvascular ischemia.     Electronically Signed   By: Marlan Palau M.D.   On: 01/30/2015  10:58                                                                    

## 2016-04-08 ENCOUNTER — Encounter (HOSPITAL_COMMUNITY)
Admission: RE | Admit: 2016-04-08 | Discharge: 2016-04-08 | Disposition: A | Discharge status: Home / Self Care | Payer: 59 | Source: Skilled Nursing Facility | Attending: Internal Medicine | Admitting: Internal Medicine

## 2016-04-08 DIAGNOSIS — M6281 Muscle weakness (generalized): Secondary | ICD-10-CM | POA: Insufficient documentation

## 2016-04-08 LAB — COMPREHENSIVE METABOLIC PANEL
ALK PHOS: 51 U/L (ref 38–126)
ALT: 13 U/L — AB (ref 14–54)
ANION GAP: 6 (ref 5–15)
AST: 15 U/L (ref 15–41)
Albumin: 3.5 g/dL (ref 3.5–5.0)
BUN: 20 mg/dL (ref 6–20)
CALCIUM: 9.1 mg/dL (ref 8.9–10.3)
CO2: 30 mmol/L (ref 22–32)
CREATININE: 0.82 mg/dL (ref 0.44–1.00)
Chloride: 101 mmol/L (ref 101–111)
GFR, EST NON AFRICAN AMERICAN: 56 mL/min — AB (ref >60)
Glucose, Bld: 93 mg/dL (ref 65–99)
Potassium: 4.3 mmol/L (ref 3.5–5.1)
Sodium: 137 mmol/L (ref 135–145)
Total Bilirubin: 0.4 mg/dL (ref 0.3–1.2)
Total Protein: 6.7 g/dL (ref 6.5–8.1)

## 2016-04-08 LAB — CBC
HCT: 38.9 % (ref 36.0–46.0)
HEMOGLOBIN: 12.8 g/dL (ref 12.0–15.0)
MCH: 29.5 pg (ref 26.0–34.0)
MCHC: 32.9 g/dL (ref 30.0–36.0)
MCV: 89.6 fL (ref 78.0–100.0)
PLATELETS: 340 10*3/uL (ref 150–400)
RBC: 4.34 MIL/uL (ref 3.87–5.11)
RDW: 14.9 % (ref 11.5–15.5)
WBC: 5.1 10*3/uL (ref 4.0–10.5)

## 2016-04-23 ENCOUNTER — Encounter: Payer: Self-pay | Admitting: Internal Medicine

## 2016-04-23 ENCOUNTER — Non-Acute Institutional Stay (SKILLED_NURSING_FACILITY): Payer: 59 | Admitting: Internal Medicine

## 2016-04-23 DIAGNOSIS — I5030 Unspecified diastolic (congestive) heart failure: Secondary | ICD-10-CM | POA: Diagnosis not present

## 2016-04-23 DIAGNOSIS — D649 Anemia, unspecified: Secondary | ICD-10-CM | POA: Diagnosis not present

## 2016-04-23 DIAGNOSIS — M7989 Other specified soft tissue disorders: Secondary | ICD-10-CM | POA: Diagnosis not present

## 2016-04-23 DIAGNOSIS — M5440 Lumbago with sciatica, unspecified side: Secondary | ICD-10-CM

## 2016-04-23 NOTE — Progress Notes (Signed)
Patient ID: Tina Lane, female   DOB: 1912-01-22, [age] y.o.   MRN: [id number]  Location:  [facility]   Place of Service:  SNF (31)  Tina OraANDY,[name], MD  Patient Care Team: Tina Oraamille Lane [name], MD as PCP - General Tina Lane [facility](Family Medicine)  Extended Emergency Contact Information Primary Emergency Contact: Tina Lane Address: [address]          [place], [place] Home Phone: [phone] Mobile Phone: [phone] Relation: Son   Goals of care: Advanced Directive information Advanced Directives 04/23/2016  Does patient have an advance directive? Yes  Type of Advance Directive Out of facility DNR (pink MOST or yellow form)  Does patient want to make changes to advanced directive? No - Patient declined  Copy of advanced directive(s) in chart? Yes     Chief Complaint  Patient presents with  . Acute Visit   Secondary to patient not wanting to wear her TED hose HPI:  Pt is a [age] y.o. female seen today for an acute visit for concerns about her TED hose-patient says TED hose are uncomfortable in hurts her-she would like them discontinued her main as needed as tolerated.  TED hose does appear to help somewhat with her edema which is chronic-she has some history of lower x-ray cellulitis as well which has been stable for some time.  She has venous stasis changes bilaterally also a history of diastolic CHF grade 1 per echo done in March of last year.  Clinically she appears to be stable her weights have been relatively stable most recently 143.4-it was around 147 earlier this year-apparently she is eating and drinking fairly well.  She continues to do well with supportive care.  Currently she has no complaints of shortness of breath or chest pain appears to be in her usual good spirits   Past Medical History  Diagnosis Date  . Glaucoma   . Thyroid disease   . GERD (gastroesophageal reflux disease)   . HOH (hard of hearing)   . Pelvic fracture  (HCC)     snf  . Back pain   . Confusion   . DNR (do not resuscitate)    Past Surgical History  Procedure Laterality Date  . Abdominal surgery    . Hemorrhoid surgery    . Cataract extraction      Allergies  Allergen Reactions  . Latex Itching  . Onion Nausea And Vomiting  . Other     SPANDEX ALLERGY  . Penicillins Other (See Comments)    UNKNOWN REACTION  . Sulfa Antibiotics Other (See Comments)    UNKNOWN REACTION      Medication List       This list is accurate as of: 04/23/16  1:22 PM.  Always use your most recent med list.               acetaminophen 325 MG tablet  Commonly known as:  TYLENOL  Take 2 tablets (650 mg total) by mouth every 6 (six) hours as needed for mild pain.     amLODipine 5 MG tablet  Commonly known as:  NORVASC  Take 1 tablet (5 mg total) by mouth daily.     aspirin 81 MG EC tablet  Take 1 tablet (81 mg total) by mouth daily.     calcitonin (salmon) 200 UNIT/ACT nasal spray  Commonly known as:  MIACALCIN/FORTICAL  Place 1 spray into alternate nostrils daily.     CALCIUM 600 + D PO  Take 1 tablet by mouth every morning.     calcium carbonate 750 MG chewable tablet  Commonly known as:  TUMS EX  Chew 1 tablet by mouth at bedtime as needed for heartburn.     cetaphil cream  Apply topical to legs lightly  twice a day     ibuprofen 200 MG tablet  Commonly known as:  ADVIL,MOTRIN  Take 200 mg by mouth daily.     ibuprofen 200 MG tablet  Commonly known as:  ADVIL,MOTRIN  Take 200 mg by mouth every 6 (six) hours as needed.     levothyroxine 75 MCG tablet  Commonly known as:  SYNTHROID, LEVOTHROID  Take 75 mcg by mouth daily before breakfast.     magnesium hydroxide 400 MG/5ML suspension  Commonly known as:  MILK OF MAGNESIA  Give 30ml by mouth once daily as needed for constipation. Do not give for more than 2 consecutive days.     polycarbophil 625 MG tablet  Commonly known as:  FIBERCON  Take 625 mg by mouth at bedtime.       polyethylene glycol packet  Commonly known as:  MIRALAX / GLYCOLAX  Take 17 g by mouth daily.     ranitidine 150 MG tablet  Commonly known as:  ZANTAC  Take 150 mg by mouth at bedtime.     timolol 0.5 % ophthalmic solution  Commonly known as:  BETIMOL  Place 1 drop into both eyes at bedtime.     triamcinolone cream 0.1 %  Commonly known as:  KENALOG  Apply twice a day to buttocks and groin for dry inflamed rash     Vitamin D3 1000 units Caps  Take 1 capsule by mouth every morning.        Review of Systems   General does not quite of any fever or chills.  Skin has chronic venous stasis changes lower extremities but is not complaining of any acute issues rashes or itching.  Head ears eyes nose mouth and throat does not complaining of any difficulty swallowing or sore throat or visual changes.  Respiratory no complaints of shortness of breath or cough.  Cardiac does not complaining of chest pain has some chronic lower extremity edema which appears relatively baseline today.  GI is not complaining of abdominal discomfort nausea vomiting diarrhea or constipation.  Muscle skeletal at times has complained of joint pain we have started ibuprofen and apparently this is helping.  Neurologic does not complain of dizziness headache or numbness.  In psych is not complaining of feeling depressed or anxious continues to be pleasant with some confusion at times.  Physical exam.  Temperature is 98.0 pulse 79 respirations 20 blood pressure 120/67.  Weight is 143.4.  General this is a pleasant LE female no distress sitting comfortably in her wheelchair.  Her skin is warm and dry-she does have venous stasis changes of her legs bilaterally this does not appear to be cellulitic.  Eyes appear reactive to light sclera and conjunctiva are clear.  Oropharynx clear mucous membranes moist.  Chest is clear to auscultation no labored breathing.  Heart is regular rate and rhythm without  murmur gallop or rub she has baseline I would say trace bordering on 1+ lower extremity edema currently her TED hose are on again she does find these uncomfortable.  Abdomen soft nontender positive bowel sounds.  Muscle skeletal is able to walk extremities 4 is ambulatory in a wheelchair-I do not note any changes other than arthritic slower extremity  weakness itch is not new with arthritic changes.  Neurologic is grossly intact her speech is clear no lateralizing findings.  Psych she continues to be pleasant interactive grossly alert and oriented appreciative of her visitors.    Immunization History  Administered Date(s) Administered  . Influenza-Unspecified 10/05/2014   Pertinent  Health Maintenance Due  Topic Date Due  . DEXA SCAN  04/14/1979  . PNA vac Low Risk Adult (1 of 2 - PCV13) 04/14/1979  . INFLUENZA VACCINE  07/30/2016       Filed Vitals:   04/23/16 1245  BP: 120/67  Pulse: 79  Temp: 98 F (36.7 C)  TempSrc: Oral  Resp: 20  Height:  (1.575 m)  Weight: 143 lb 11.8 oz (65.2 kg)   Body mass index is 26.28 kg/(m^2). Physical Exam As noted above  Labs reviewed:  Recent Labs  01/14/16 1609 03/05/16 0800 04/08/16 0600  NA 135 135 137  K 4.5 4.2 4.3  CL 97* 96* 101  CO2 GLUCOSE 103* 103* 93  BUN 27* 20 20  CREATININE 0.90 0.89 0.82  CALCIUM 9.8 9.6 9.1    Recent Labs  12/14/15 0815 03/05/16 0800 04/08/16 0600  AST ALT 11* 17 13*  ALKPHOS 62 67 51  BILITOT 0.4 0.7 0.4  PROT 7.8 8.0 6.7  ALBUMIN 3.9 4.0 3.5    Recent Labs  12/14/15 0815 01/14/16 1609 03/05/16 0800 04/08/16 0600  WBC 5.6 8.6 5.7 5.1  NEUTROABS 3.0 4.4 2.8  --   HGB 14.3 13.8 14.8 12.8  HCT 43.2 42.6 45.6 38.9  MCV 90.8 90.3 90.1 89.6  PLT 343 357 344 340   Lab Results  Component Value Date   TSH 3.935 02/16/2016   Lab Results  Component Value Date   HGBA1C 5.8* 01/30/2015   Lab Results  Component Value Date   CHOL 105 01/30/2015    HDL 44 01/30/2015   LDLCALC 45 01/30/2015   TRIG 78 01/30/2015   CHOLHDL 2.4 01/30/2015    Assessment and plan.  #1 lower extremity edema with history of diastolic CHF-she wants her compression hose a off most the time-at this point would be okay with this secondary to patient's concerns and comfort-again we are looking at quality-of-life here and if compression hose are limiting this would be inclined to discontinue this-continue to monitor her edema-this appears to be stabilized she is not on any diuretic nonetheless weight has been relatively stable as well as edema again this will have to be monitored.  #2 history of joint pain especially back pain this appears to have improved since ibuprofen was started she has not tolerated apparently narcotics well the past nonetheless she appears to be doing well with this.  #3-history of anemia I suspect of chronic disease hemoglobins per chart review have ranged from high 11th up to 14.8 actually at one point-- currently 12.8 this appears relatively in her range at this point will monitor periodically again at advanced age would not be really a candidate for any type of aggressive workup she appears clinically to be doing quite well.  [id number] note greater than 25 minutes spent assessing patient discussing her status with nursing reviewing her chart and labs-and coordinating and formulating a plan of care-of note greater than 50% of time spent coordinating plan of care

## 2016-04-23 NOTE — Progress Notes (Signed)
Patient ID: Tina Lane, female   DOB: [birth year], [age] y.o.   MRN: [id number]

## 2016-05-22 ENCOUNTER — Non-Acute Institutional Stay (SKILLED_NURSING_FACILITY): Payer: 59 | Admitting: Internal Medicine

## 2016-05-22 ENCOUNTER — Encounter: Payer: Self-pay | Admitting: Internal Medicine

## 2016-05-22 DIAGNOSIS — M549 Dorsalgia, unspecified: Secondary | ICD-10-CM

## 2016-05-22 DIAGNOSIS — I10 Essential (primary) hypertension: Secondary | ICD-10-CM | POA: Diagnosis not present

## 2016-05-22 DIAGNOSIS — E038 Other specified hypothyroidism: Secondary | ICD-10-CM

## 2016-05-22 DIAGNOSIS — K219 Gastro-esophageal reflux disease without esophagitis: Secondary | ICD-10-CM

## 2016-05-22 NOTE — Progress Notes (Signed)
Location:  [facility] Room Number: 142/D Place of Service:  SNF (31) Provider:  Benna Dunks, MD  Patient Care Team: [name], MD as PCP - General [facility])  Extended Emergency Contact Information Primary Emergency Contact: Wedeking,Nathan Address: [address]          [place], [place] Home Phone: [phone] Mobile Phone: [phone] Relation: Son  Code Status:  DNR Goals of care: Advanced Directive information Advanced Directives 05/22/2016  Does patient have an advance directive? Yes  Type of Advance Directive Out of facility DNR (pink MOST or yellow form)  Does patient want to make changes to advanced directive? No - Patient declined  Copy of advanced directive(s) in chart? Yes     Chief Complaint  Patient presents with  . Medical Management of Chronic Issues    For routine visit   Medical management of CHF hypertension hypothyroidism GERD  HPI:  Pt is a 82 y.o. female seen today for medical management of chronic medical issues.  She actually continues to be quite stable he did see her last month for complaints she did not really want to wear her TED hose she found them uncomfortable she has this for lower extremity edema with a history of diastolic CHF grade 1-she is currently not wearing her TED hose nonetheless edema appears to be stable again secondary to concerns for quality-of-life in comfort will make these when necessary certainly if she does not want to wear them I would be hesitant to order this.  In regards to diastolic CHF this is appears stable edema appears to be at baseline weight appears to be stable at 144.4 pounds she is not currently on a diuretic.  For hypertension she is on low-dose Norvasc 5 mg a day this appears stable recent blood pressures 124/70-120/59.  She does have a history hypothyroidism --10 years on Synthroid we will update a TSH.  She also has a  history of pain this is more so back pain she does have a history of compression fracture of T11-she is on ibuprofen which apparently she's been on in the past and this has been effective in helping with her pain.  Currently she has no complaints she is sitting in her wheelchair continues to be pleasant and affable-she actually had worked earlier with restorative walking the halls with a  walker and apparently did quite well per restorative's report.   Past Medical History  Diagnosis Date  . Glaucoma   . Thyroid disease   . GERD (gastroesophageal reflux disease)   . HOH (hard of hearing)   . Pelvic fracture (HCC)     snf  . Back pain   . Confusion   . DNR (do not resuscitate)    Past Surgical History  Procedure Laterality Date  . Abdominal surgery    . Hemorrhoid surgery    . Cataract extraction      Allergies  Allergen Reactions  . Latex Itching  . Onion Nausea And Vomiting  . Other     SPANDEX ALLERGY  . Penicillins Other (See Comments)    UNKNOWN REACTION  . Sulfa Antibiotics Other (See Comments)    UNKNOWN REACTION      Medication List       This list is accurate as of: 05/22/16  3:23 PM.  Always use your most recent med list.  acetaminophen 325 MG tablet  Commonly known as:  TYLENOL  Take 2 tablets (650 mg total) by mouth every 6 (six) hours as needed for mild pain.     amLODipine 5 MG tablet  Commonly known as:  NORVASC  Take 1 tablet (5 mg total) by mouth daily.     aspirin 81 MG EC tablet  Take 1 tablet (81 mg total) by mouth daily.     calcitonin (salmon) 200 UNIT/ACT nasal spray  Commonly known as:  MIACALCIN/FORTICAL  Place 1 spray into alternate nostrils daily.     CALCIUM 600 + D PO  Take 1 tablet by mouth every morning.     cetaphil cream  Apply topical to legs lightly  twice a day     ibuprofen 200 MG tablet  Commonly known as:  ADVIL,MOTRIN  Take 200 mg by mouth daily.     ibuprofen 200 MG tablet  Commonly known as:   ADVIL,MOTRIN  Take 200 mg by mouth every 6 (six) hours as needed.     levothyroxine 75 MCG tablet  Commonly known as:  SYNTHROID, LEVOTHROID  Take 75 mcg by mouth daily before breakfast.     polycarbophil 625 MG tablet  Commonly known as:  FIBERCON  Take 625 mg by mouth at bedtime.     polyethylene glycol packet  Commonly known as:  MIRALAX / GLYCOLAX  Take 17 g by mouth daily.     ranitidine 150 MG tablet  Commonly known as:  ZANTAC  Take 150 mg by mouth at bedtime.     triamcinolone cream 0.1 %  Commonly known as:  KENALOG  Apply twice a day to buttocks and groin for dry inflamed rash     Vitamin D3 1000 units Caps  Take 1 capsule by mouth every morning.        Review of Systems   In general she is not complaining of any fever or chills. SKIN does not complain of rashes or itching she does have baseline solar induced changes in a history of right leg cellulitis at times with this is been stable for some time.  Head ears eyes nose mouth and throat does not complain of any visual changes sore throat.  Respiratory does not complaining of any shortness breath or cough.  Cardiac no chest pain continue with some mild lower extremity edema.  GI is not complaining of abdominal pain nausea vomiting diarrhea constipation or dysphagia.  Muscle skeletal not complaining of joint or back pain at this time again the Avapro for an appears to be helping.  Neurologic complaining of dizziness headache or syncopal-type feelings.  Psychiatric does not complain of depression or anxiety   Immunization History  Administered Date(s) Administered  . Influenza-Unspecified 10/05/2014   Pertinent  Health Maintenance Due  Topic Date Due  . DEXA SCAN  05/22/2017 (Originally 04/14/1977)  . PNA vac Low Risk Adult (1 of 2 - PCV13) 05/22/2017 (Originally 04/14/1977)  . INFLUENZA VACCINE  07/30/2016   No flowsheet data found. Functional Status Survey:    Filed Vitals:   05/22/16 1503  BP:  124/70  Pulse: 88  Temp: 97.4 F (36.3 C)  TempSrc: Oral  Resp: 20  Height: 5\' 2"  (1.575 m)  Weight: 144 lb 6.4 oz (65.499 kg)   Body mass index is 26.4 kg/(m^2). Physical Exam  Denton Meek\General is a pleasant elderly female in no distress sitting comfortably in a wheelchair.  Her skin is warm and dry she does have solar induced changes  on her arms and face continues with venous stasis changes of her lower legs bilaterally.  Eyes pupils appear reactive light sclerae and neck are clear visual acuity appears grossly intact.  Oropharynx clear mucous membranes moist.  Chest is clear to auscultation somewhat shallow entry there is no labored breathing.  Abdomen is soft nontender with positive bowel sounds.  Muscle skeletal is able to move all extremities 4 largely of the brain wheelchair I do not note arthritic changes diffuse but no acute changes again she does walk with a walker at times with restorative.  Neurologic grossly intact her speech is clear no lateralizing findings.  Psych she is alert and grossly oriented conversational a very pleasant individual apparently she does have periods of confusion-but does read the paper-.    Labs reviewed:  Recent Labs  01/14/16 1609 03/05/16 0800 04/08/16 0600  NA 135 135 137  K 4.5 4.2 4.3  CL 97* 96* 101  CO2 GLUCOSE 103* 103* 93  BUN 27* 20 20  CREATININE 0.90 0.89 0.82  CALCIUM 9.8 9.6 9.1    Recent Labs  12/14/15 0815 03/05/16 0800 04/08/16 0600  AST ALT 11* 17 13*  ALKPHOS 62 67 51  BILITOT 0.4 0.7 0.4  PROT 7.8 8.0 6.7  ALBUMIN 3.9 4.0 3.5    Recent Labs  12/14/15 0815 01/14/16 1609 03/05/16 0800 04/08/16 0600  WBC 5.6 8.6 5.7 5.1  NEUTROABS 3.0 4.4 2.8  --   HGB 14.3 13.8 14.8 12.8  HCT 43.2 42.6 45.6 38.9  MCV 90.8 90.3 90.1 89.6  PLT 343 357 344 340   Lab Results  Component Value Date   TSH 3.935 02/16/2016   Lab Results  Component Value Date   HGBA1C 5.8* 01/30/2015   Lab  Results  Component Value Date   CHOL 105 01/30/2015   HDL 44 01/30/2015   LDLCALC 45 01/30/2015   TRIG 78 01/30/2015   CHOLHDL 2.4 01/30/2015    Significant Diagnostic Results in last 30 days:  No results found.  Assessment/Plan  #1 history of hypertension this appears stable as noted above she is on low dose Norvasc 5 mg a day recent blood pressures 124/70-120/59.  #2 history of CVA she continues on aspirin she has very minimal FSS here actually has walked again with restorative considering her advanced age she is actually doing quite remarkably well.  #3 history hypothyroidism continues on Synthroid TSH in February was within normal limits at 3.935.  #4 history of suspected GERD this appears stable on ranitidine-she did go to the hospital several months ago for chest pain thought to be noncardiac-she was switched from a PPI secondary to her pharmacy recommendation at this point will monitor at this point the ranitidine appears to be effective.  #5 history of pain mainly back pain with history of compression fractures ibuprofen appears to be giving adequate relief at this point.  She does have a history of osteoporosis continues on calcium supplementation.  I also noted a previous history of a pelvic fracture.  History of anemia-I suspect chronic disease hemoglobin actually stable at 12.8 on lab done on April 10 at this point will monitor at periodic intervals.  #7 history of diastolic CHF grade 1-this is stable weights are stable edema appears to be fairly minimal and baseline today no complaints of shortness of breath or chest pain at this point will monitor.   [id number]      London Sheer, CMA  [phone]   

## 2016-07-03 ENCOUNTER — Encounter: Payer: Self-pay | Admitting: Internal Medicine

## 2016-07-03 ENCOUNTER — Non-Acute Institutional Stay (SKILLED_NURSING_FACILITY): Payer: 59 | Admitting: Internal Medicine

## 2016-07-03 DIAGNOSIS — I10 Essential (primary) hypertension: Secondary | ICD-10-CM | POA: Diagnosis not present

## 2016-07-03 DIAGNOSIS — H908 Mixed conductive and sensorineural hearing loss, unspecified: Secondary | ICD-10-CM | POA: Insufficient documentation

## 2016-07-03 DIAGNOSIS — E038 Other specified hypothyroidism: Secondary | ICD-10-CM | POA: Diagnosis not present

## 2016-07-03 NOTE — Patient Instructions (Signed)
No new orders

## 2016-07-03 NOTE — Assessment & Plan Note (Signed)
Pursuing hearing aids at an age of [age] does not appear to be appropriate

## 2016-07-03 NOTE — Assessment & Plan Note (Signed)
TSH therapeutic; no change

## 2016-07-03 NOTE — Progress Notes (Signed)
    Facility Location: [facility] Room Number: 142-D   This is a nursing facility follow up  of chronic medical diagnoses as delineated below.  Interim medical record and care since last [facility] visit was updated with review of diagnostic studies and change in clinical status since last visit were documented.  HPI: She has medical diagnoses of hypertension, hypothyroidism, vitamin D deficiency and gastroesophageal reflux. She is on prophylactic low-dose coated aspirin. Most recent labs were 04/08/16. GFR was mildly reduced at 56. ALT was low at 13. Vitamin D level was therapeutic at 50.3 on 02/16/16 . TSH was also therapeutic at that time with a value of 3.94.  Review of systems questions were answered with "not that I know of".  Veracity is in question as she could not give me the date although she knew her date of birth and that she was [id number].  Physical exam:  Pertinent or positive findings: She appears her stated age. She has scattered senile keratoses. Dense arcus senilis is present. She has a flat verrucoid type lesion along the inner aspect of the left eye. She is profoundly hard of hearing. Present is an upper plate. She has lower implants. Upper extremities are weaker than lower extremities. Pedal pulses are decreased.  General appearance:Adequately nourished; no acute distress , increased work of breathing is present.   Lymphatic: No lymphadenopathy about the head, neck, axilla . Eyes: No conjunctival inflammation or lid edema is present. There is no scleral icterus. Ears:  External ear exam shows no significant lesions or deformities.   Nose:  External nasal examination shows no deformity or inflammation. Nasal mucosa are pink and moist without lesions ,exudates Oral exam: lips and gums are healthy appearing.There is no oropharyngeal erythema or exudate . Neck:  No thyromegaly, masses, tenderness noted.    Heart:  Normal rate and regular rhythm. S1 and  S2 normal without gallop, murmur, click, rub .  Lungs:Chest clear to auscultation without wheezes, rhonchi,rales , rubs. Abdomen:Bowel sounds are normal. Abdomen is soft and nontender with no organomegaly, hernias,masses. GU: deferred  Extremities:  No cyanosis, clubbing,edema  Neurologic exam : Balance,Rhomberg,finger to nose testing could not be completed due to clinical state Deep tendon reflexes are equal but 1/2 + Skin: Warm & dry w/o tenting.    See summary under each active problem in the Problem List with associated updated therapeutic plan

## 2016-07-03 NOTE — Assessment & Plan Note (Signed)
Blood pressure adequately controlled without edema No change

## 2016-07-15 IMAGING — DX DG THORACIC SPINE 2V
3 series · 3 of 3 positions shown · non-contrast
Comparison: Chest x-ray dated 09/27/2014 and 07/16/2014

CLINICAL DATA: Back pain.

EXAM:
THORACIC SPINE - 2 VIEW

[t-spine ap]
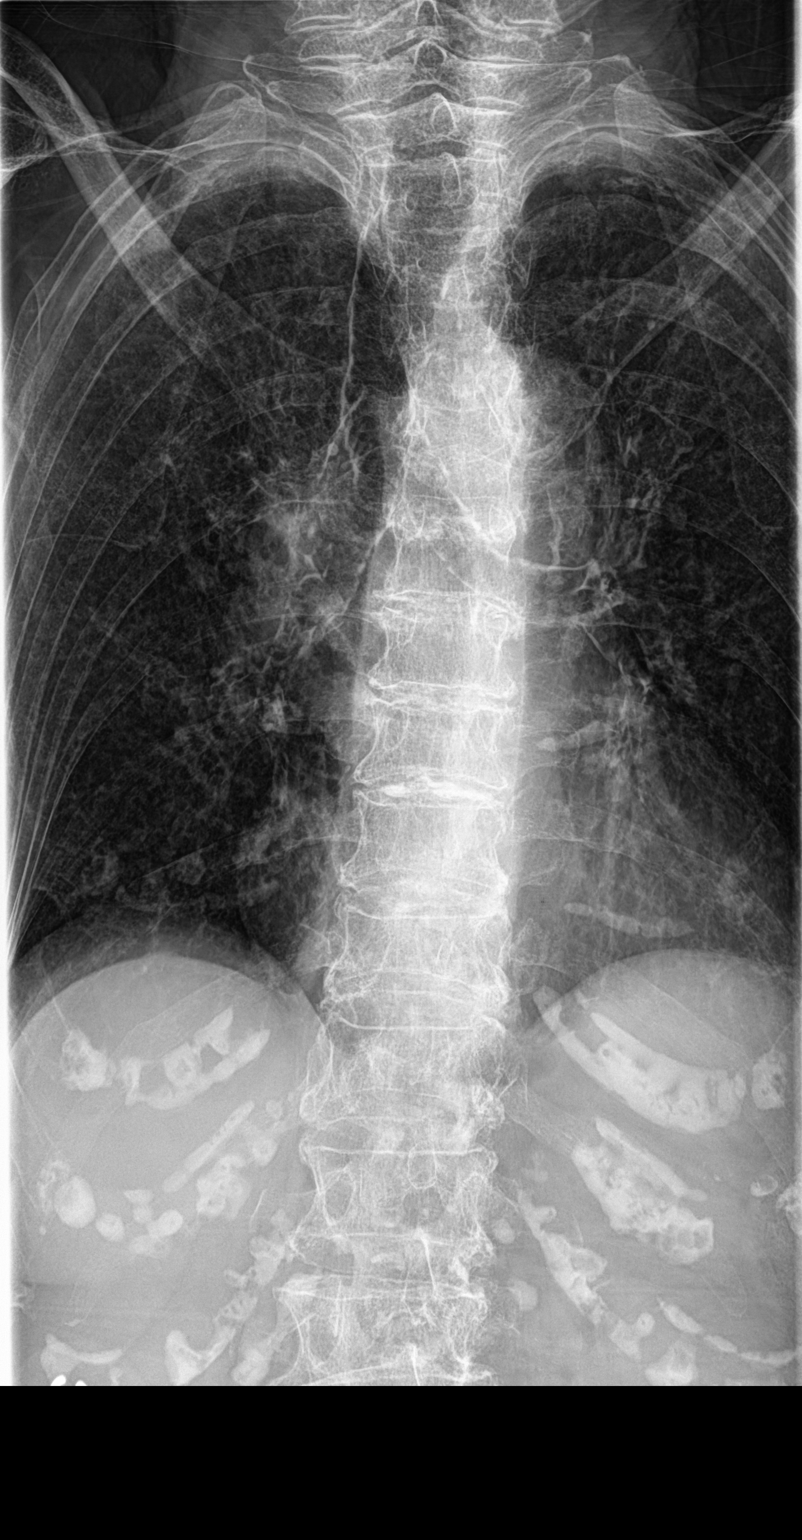

[t-spine swimmers]
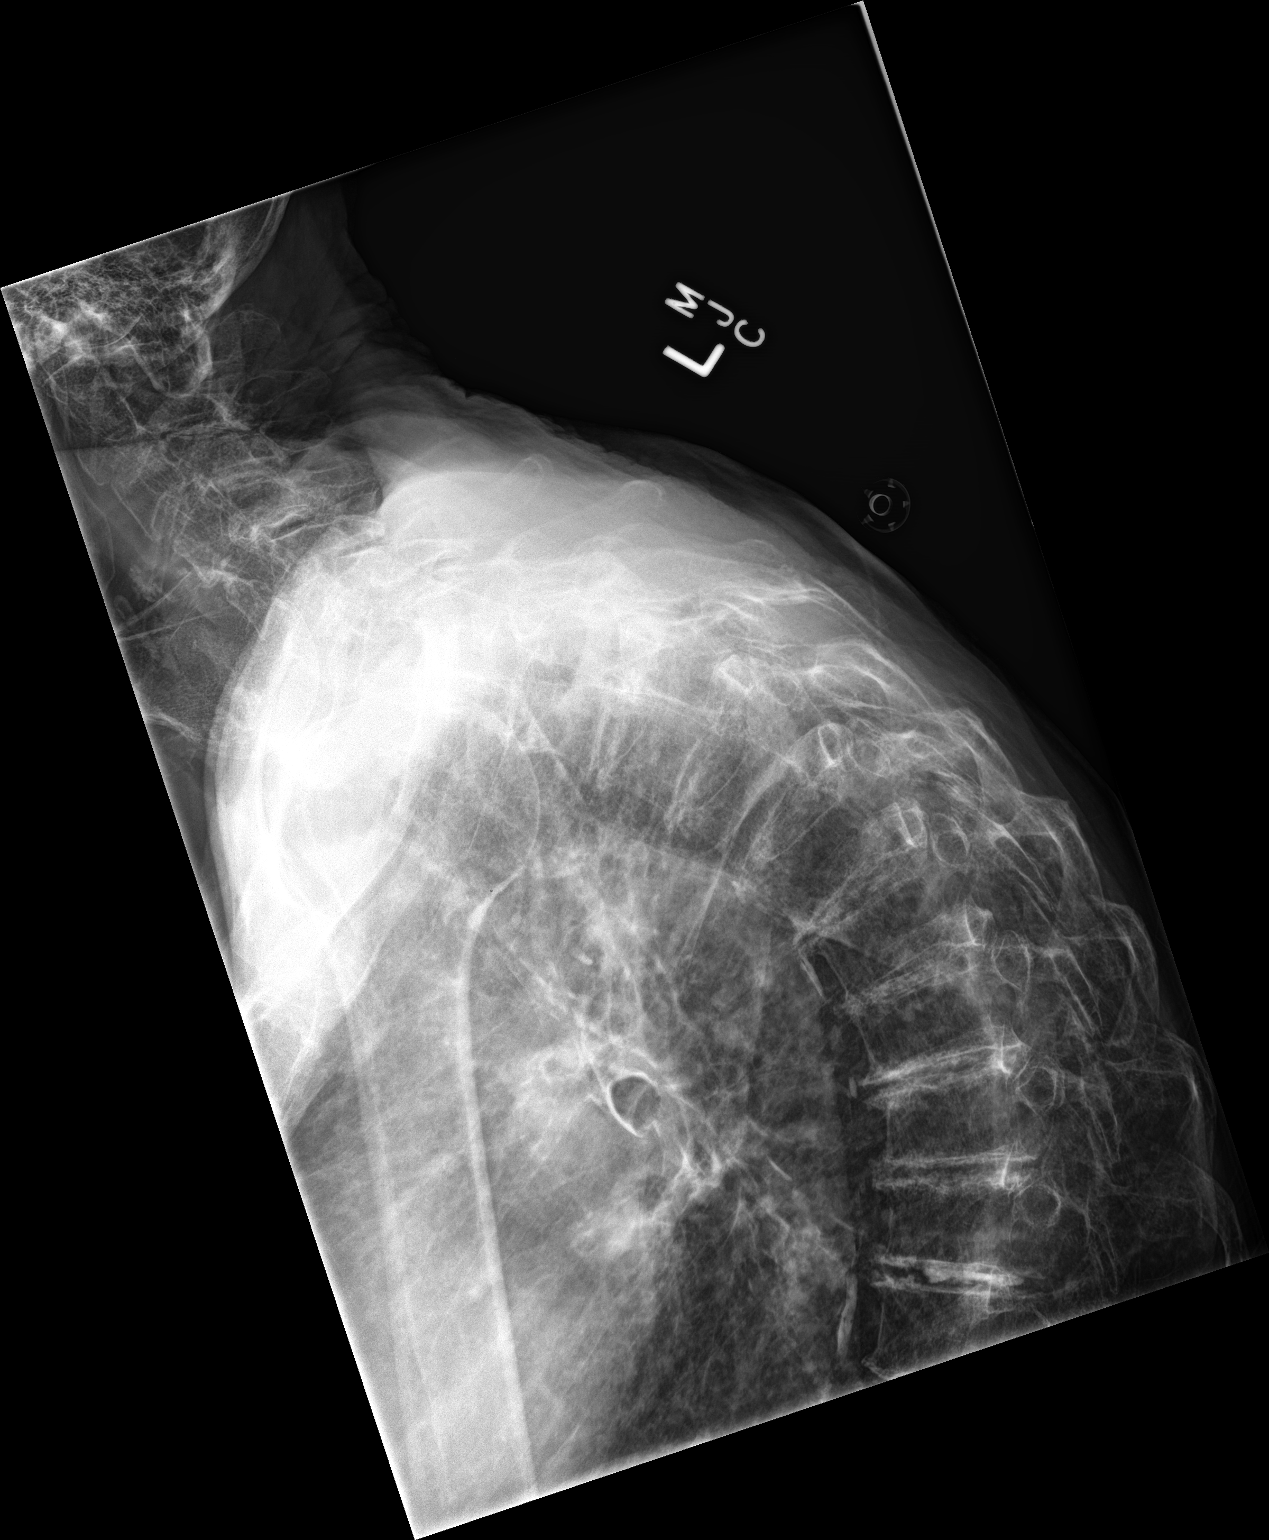

[t-spine lat]
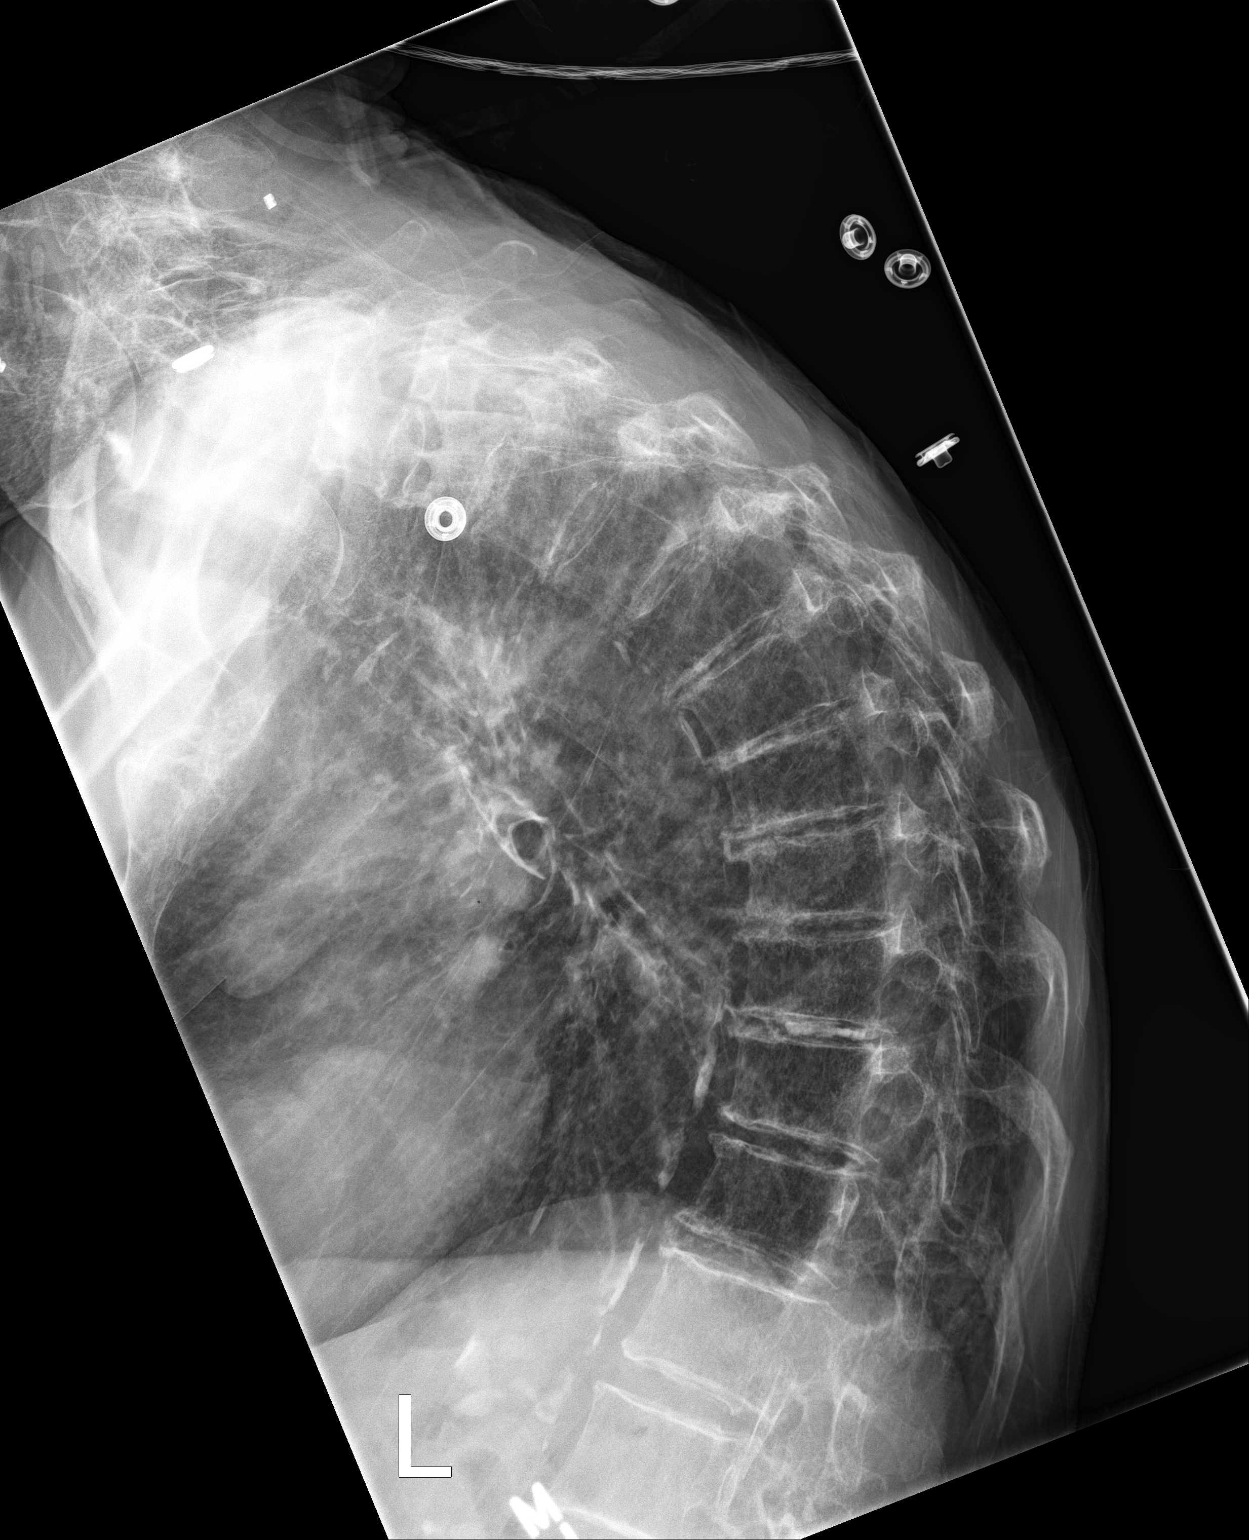

[3 of 3 positions shown; findings below may reference images not displayed]

FINDINGS: There is no fracture. There is no bone destruction. There is marked
accentuation of the thoracic kyphosis with diffuse osteopenia but
the appearance is essentially unchanged since 07/16/2014. There is
diffuse degenerative disc disease, also unchanged.
IMPRESSION: No acute abnormalities of the thoracic spine.

## 2016-07-15 IMAGING — DX DG LUMBAR SPINE COMPLETE 4+V
5 series · 5 of 5 positions shown · non-contrast
Comparison: Radiographs dated 08/07/2011 and CT scan of the abdomen
and pelvis dated 08/07/2011

CLINICAL DATA: Back pain. Recent pelvic fracture in August 2014.

EXAM:
LUMBAR SPINE - COMPLETE 4+ VIEW

[l-spine obl (1 of 2)]
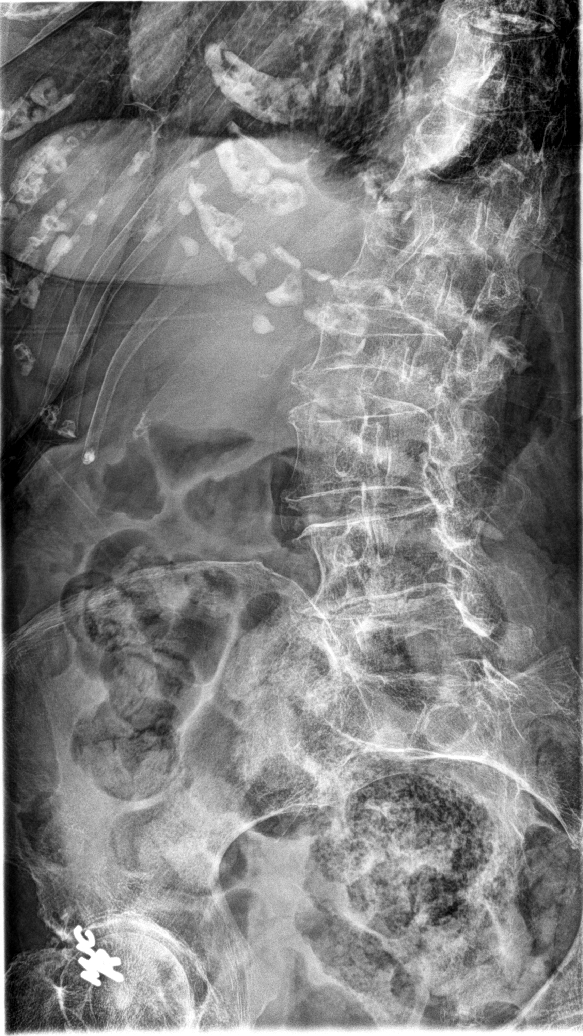

[l-spine obl (2 of 2)]
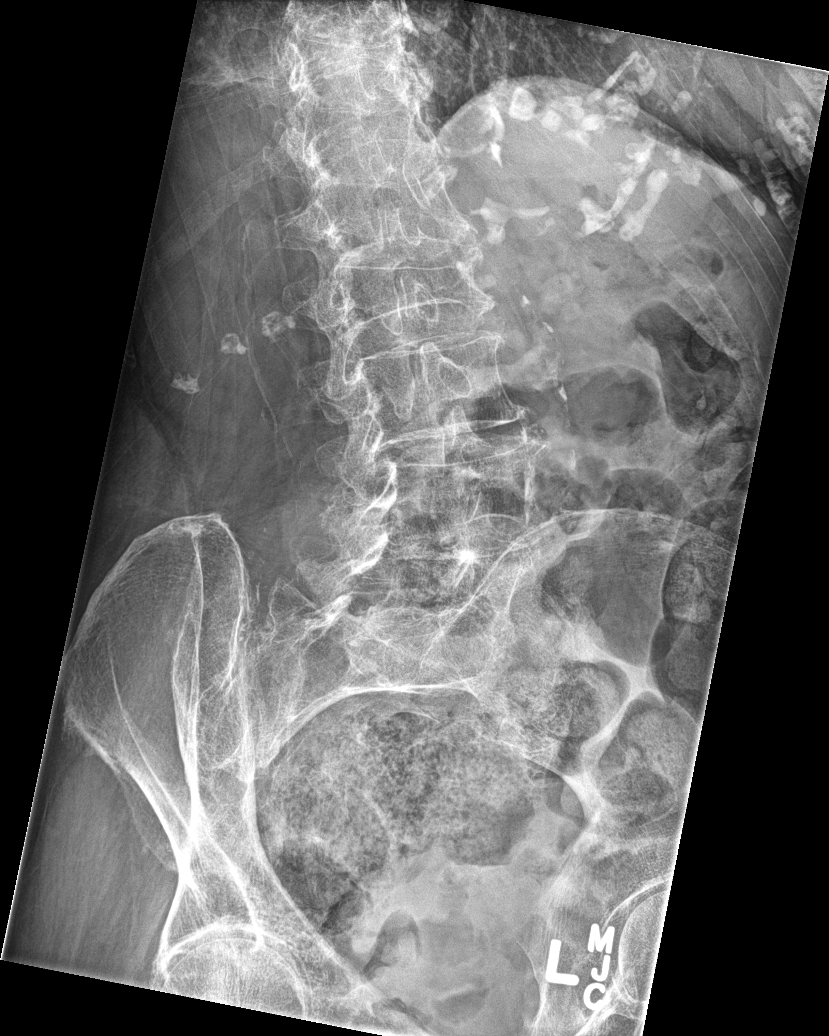

[l-spine lat]
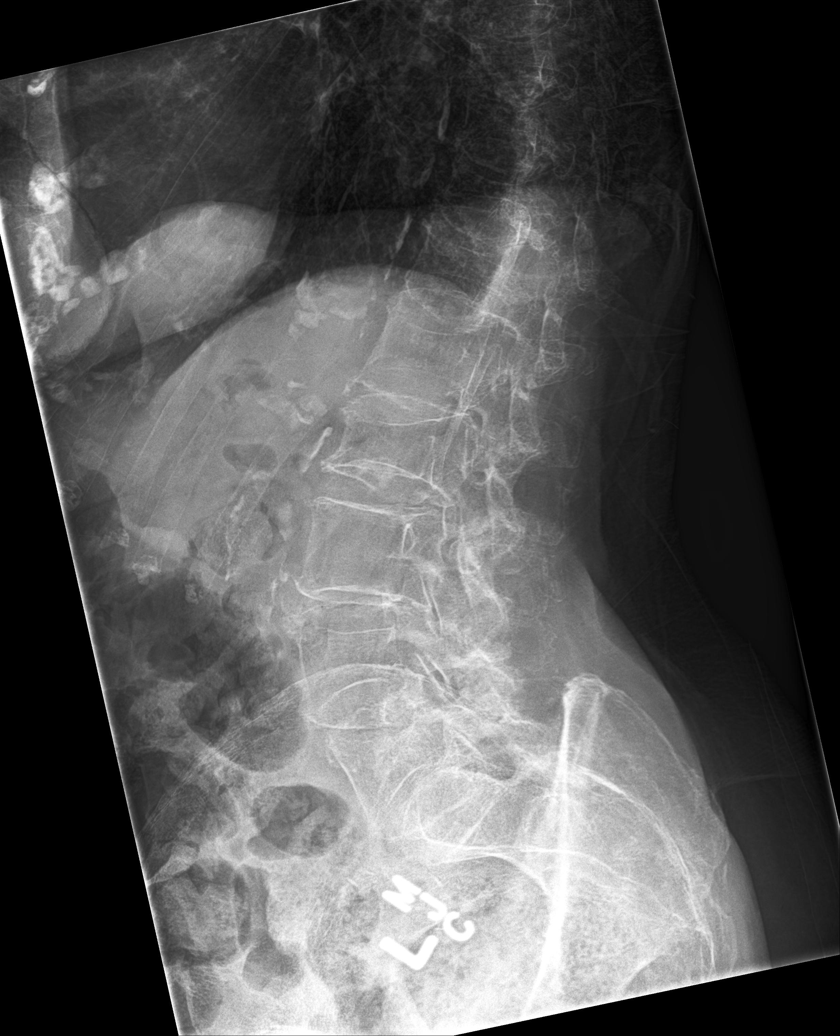

[l-spine spot]
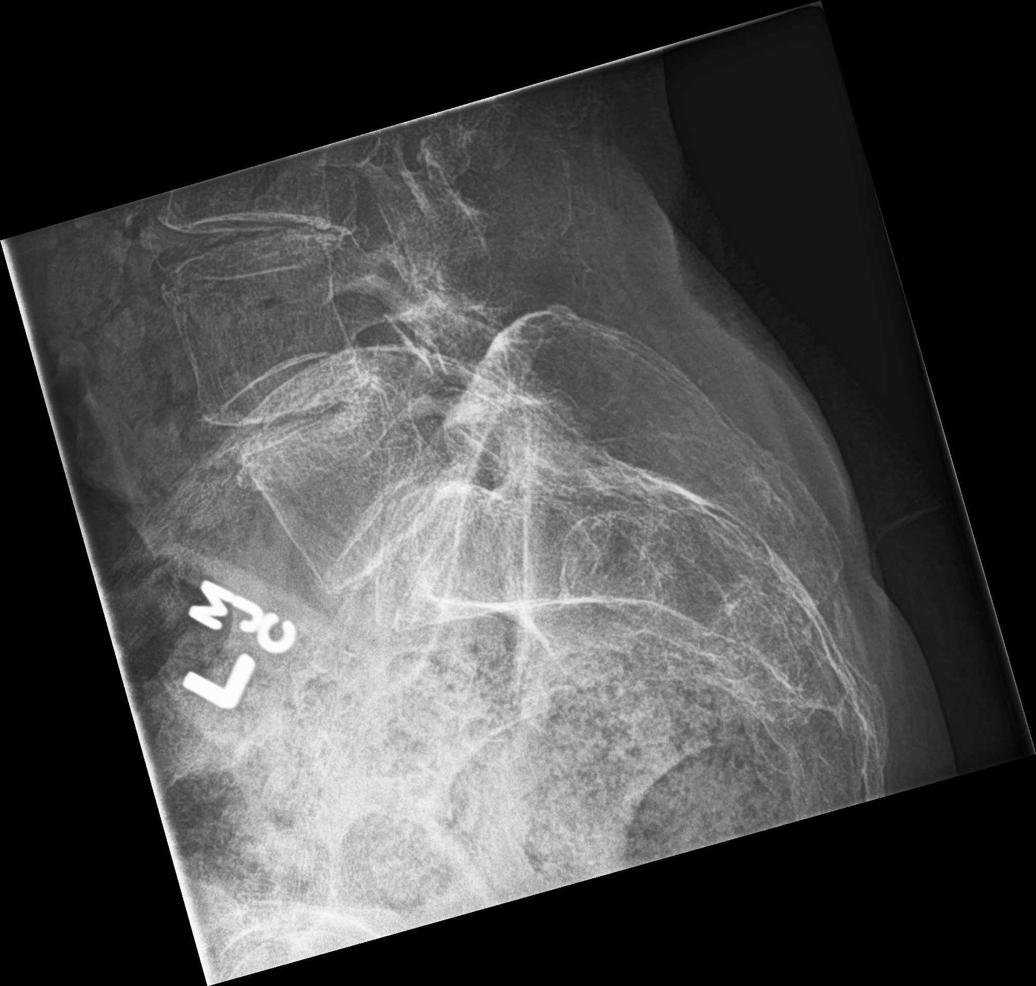

[t-spine lat]
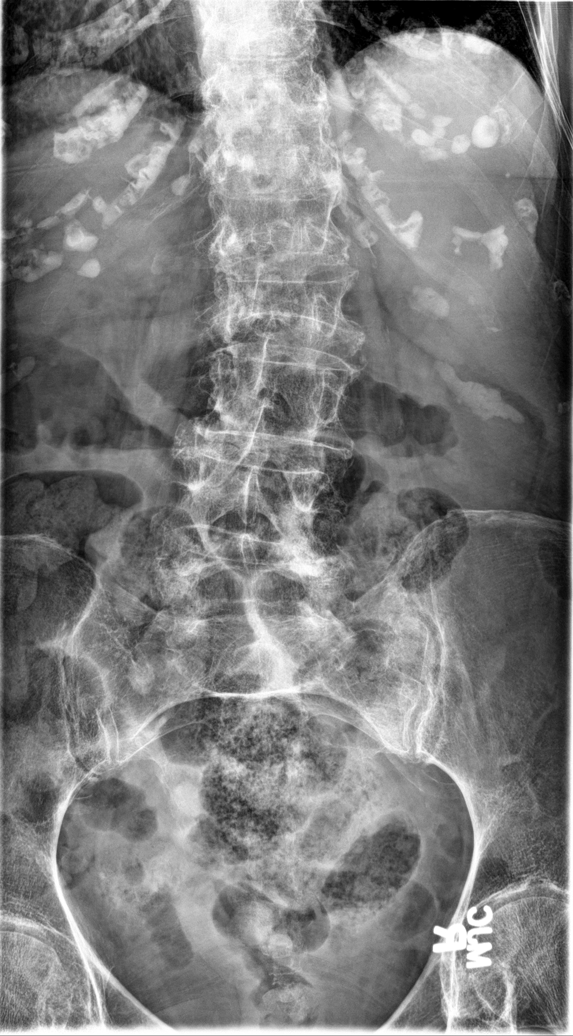

[5 of 5 positions shown; findings below may reference images not displayed]

FINDINGS: There is diffuse osteopenia. There is an old compression fracture of
the inferior endplate of L2. There is slight disc space narrowing at
L3-4 and L4-5. There is no spondylolisthesis. There is a slight
rotoscoliosis which is stable.
IMPRESSION: No acute abnormality of the lumbar spine.

## 2016-07-23 ENCOUNTER — Encounter: Payer: Self-pay | Admitting: Internal Medicine

## 2016-07-23 ENCOUNTER — Non-Acute Institutional Stay (SKILLED_NURSING_FACILITY): Payer: 59 | Admitting: Internal Medicine

## 2016-07-23 DIAGNOSIS — S22089A Unspecified fracture of T11-T12 vertebra, initial encounter for closed fracture: Secondary | ICD-10-CM | POA: Insufficient documentation

## 2016-07-23 DIAGNOSIS — M546 Pain in thoracic spine: Secondary | ICD-10-CM

## 2016-07-23 DIAGNOSIS — S22089S Unspecified fracture of T11-T12 vertebra, sequela: Secondary | ICD-10-CM

## 2016-07-23 NOTE — Patient Instructions (Signed)
Please see new orders

## 2016-07-23 NOTE — Progress Notes (Signed)
   This is a nursing facility follow up for specific acute issue of back pain.  Interim medical record and care since last [facility] visit was updated with review of diagnostic studies and change in clinical status since last visit were documented.  HPI: She's developed severe low back pain which has not responded to Tylenol or ibuprofen.. This is in the context of osteoporosis for which she has received calcitonin. She's also on calcium and vitamin D supplementation. She has had a history of pelvic fracture. Films of the thoracic and lumbar spine 03/05/16 revealed stable compression deformities at T-11 mL-2. Disc space narrowing was noted L3-4 and L4-5.   Review of systems could not be completed as her only response is that she hurts "all over". She is profoundly deaf and can not understand the questions.  Pertinent or positive findings include: She appears her stated age. When I entered the room she was sleeping quietly  When I woke her up she began to complain of pain. She has minor rales at the bases. She was able to sit up with minimal help. Fusiform changes noted in the knees with crepitus. Straight leg raising is negative bilaterally. There is no pain to percussion over the flanks or light percussion over the thoracic spine. There was accentuated posterior curvature of the thoracic spine.. All pulses are decreased. She has scattered seborrheic keratoses especially over the face.  General appearance :adequately nourished; in no distress. Eyes: No conjunctival inflammation or scleral icterus is present. Oral exam:  Lips and gums are healthy appearing.There is no oropharyngeal erythema or exudate noted.Upper plate present.  Heart:  Normal rate and regular rhythm. S1 and S2 normal without gallop, murmur, click, rub or other extra sounds   Lungs:no wheezes, rhonchi,or rubs present.No increased work of breathing.  Abdomen: bowel sounds hyperactive, soft and non-tender without  masses, organomegaly or hernias noted.  No guarding or rebound. No AAA. Vascular : all pulses equal ; no bruits present. Skin:Warm & dry.  Intact without suspicious lesions or rashes ; no  jaundice  Lymphatic: No lymphadenopathy is noted about the head, neck, axilla Neuro: Strength generally decreased  #1 back pain probably related to chronic compression fracture at T11 Plan see orders

## 2016-08-09 ENCOUNTER — Emergency Department (HOSPITAL_COMMUNITY)
Admission: EM | Admit: 2016-08-09 | Discharge: 2016-08-09 | Disposition: A | Discharge status: Home / Self Care | Payer: 59 | Attending: Emergency Medicine | Admitting: Emergency Medicine

## 2016-08-09 ENCOUNTER — Inpatient Hospital Stay
Admission: RE | Admit: 2016-08-09 | Discharge: 2017-12-30 | Disposition: E | Payer: 59 | Source: Ambulatory Visit | Attending: Internal Medicine | Admitting: Internal Medicine

## 2016-08-09 ENCOUNTER — Non-Acute Institutional Stay (SKILLED_NURSING_FACILITY): Payer: 59 | Admitting: Internal Medicine

## 2016-08-09 ENCOUNTER — Encounter: Payer: Self-pay | Admitting: Internal Medicine

## 2016-08-09 ENCOUNTER — Encounter (HOSPITAL_COMMUNITY): Payer: Self-pay

## 2016-08-09 DIAGNOSIS — R531 Weakness: Secondary | ICD-10-CM

## 2016-08-09 DIAGNOSIS — R55 Syncope and collapse: Secondary | ICD-10-CM | POA: Diagnosis present

## 2016-08-09 DIAGNOSIS — Z79899 Other long term (current) drug therapy: Secondary | ICD-10-CM | POA: Diagnosis not present

## 2016-08-09 DIAGNOSIS — Z791 Long term (current) use of non-steroidal anti-inflammatories (NSAID): Secondary | ICD-10-CM | POA: Insufficient documentation

## 2016-08-09 DIAGNOSIS — R4182 Altered mental status, unspecified: Secondary | ICD-10-CM

## 2016-08-09 DIAGNOSIS — E039 Hypothyroidism, unspecified: Secondary | ICD-10-CM | POA: Diagnosis not present

## 2016-08-09 DIAGNOSIS — Z7982 Long term (current) use of aspirin: Secondary | ICD-10-CM | POA: Diagnosis not present

## 2016-08-09 DIAGNOSIS — N390 Urinary tract infection, site not specified: Secondary | ICD-10-CM

## 2016-08-09 DIAGNOSIS — I10 Essential (primary) hypertension: Secondary | ICD-10-CM | POA: Insufficient documentation

## 2016-08-09 LAB — BASIC METABOLIC PANEL
ANION GAP: 5 (ref 5–15)
BUN: 21 mg/dL — ABNORMAL HIGH (ref 6–20)
CALCIUM: 9 mg/dL (ref 8.9–10.3)
CHLORIDE: 98 mmol/L — AB (ref 101–111)
CO2: 29 mmol/L (ref 22–32)
CREATININE: 0.88 mg/dL (ref 0.44–1.00)
GFR calc non Af Amer: 51 mL/min — ABNORMAL LOW (ref >60)
GFR, EST AFRICAN AMERICAN: 60 mL/min — AB (ref >60)
Glucose, Bld: 109 mg/dL — ABNORMAL HIGH (ref 65–99)
Potassium: 4.1 mmol/L (ref 3.5–5.1)
SODIUM: 132 mmol/L — AB (ref 135–145)

## 2016-08-09 LAB — URINALYSIS, ROUTINE W REFLEX MICROSCOPIC
Bilirubin Urine: NEGATIVE
GLUCOSE, UA: NEGATIVE mg/dL
Hgb urine dipstick: NEGATIVE
Ketones, ur: NEGATIVE mg/dL
NITRITE: NEGATIVE
PH: 6 (ref 5.0–8.0)
Protein, ur: NEGATIVE mg/dL
SPECIFIC GRAVITY, URINE: 1.01 (ref 1.005–1.030)

## 2016-08-09 LAB — URINE MICROSCOPIC-ADD ON: RBC / HPF: NONE SEEN RBC/hpf (ref 0–5)

## 2016-08-09 LAB — CBC
HEMATOCRIT: 41.1 % (ref 36.0–46.0)
Hemoglobin: 13.6 g/dL (ref 12.0–15.0)
MCH: 30.4 pg (ref 26.0–34.0)
MCHC: 33.1 g/dL (ref 30.0–36.0)
MCV: 91.7 fL (ref 78.0–100.0)
Platelets: 340 10*3/uL (ref 150–400)
RBC: 4.48 MIL/uL (ref 3.87–5.11)
RDW: 13.8 % (ref 11.5–15.5)
WBC: 6.5 10*3/uL (ref 4.0–10.5)

## 2016-08-09 MED ORDER — CEPHALEXIN 500 MG PO CAPS: 500.0000 mg | ORAL_CAPSULE | Freq: Three times a day (TID) | ORAL | 0 refills | Status: DC

## 2016-08-09 MED ORDER — CEPHALEXIN 500 MG PO CAPS
500.0000 mg | ORAL_CAPSULE | Freq: Once | ORAL | Status: AC
  Administered 2016-08-09: 500 mg via ORAL
  Filled 2016-08-09: qty 1

## 2016-08-09 NOTE — ED Provider Notes (Signed)
[facility]-EM[facility]Y DEPT Provider Note   CSN: [id number] Arrival date & time: 07/31/2016  1512  First Provider Contact:  None       History   Chief Complaint Chief Complaint  Patient presents with  . Loss of Consciousness    HPI Tina Lane is a 82 y.o. female.  Patient noted by family member to be slow to respond/lethargic when [facility]  Son indicates he had gone to see her 1 hr earlier than normal, and found her asleep ?unresponsive, and difficult to arouse. By EMS arrival the patient was awake and alert, and currently mental status at baseline. Glucose normal. Patient very limited historian - denies pain or other specific symptom. Son reports has been eating per her normal. No vomiting. No report of trauma or fall. No recent fever.       The history is provided by the patient, a relative and the EMS personnel. The history is limited by the condition of the patient.  Loss of Consciousness   Pertinent negatives include chest pain, fever, headaches and vomiting.    Past Medical History:  Diagnosis Date  . Back pain   . Confusion   . DNR (do not resuscitate)   . GERD (gastroesophageal reflux disease)   . Glaucoma   . HOH (hard of hearing)   . Pelvic fracture (HCC)    snf  . Thyroid disease     Patient Active Problem List   Diagnosis Date Noted  . T11 vertebral fracture (HCC) 07/23/2016  . Essential hypertension 07/03/2016  . Deafness, mixed type 07/03/2016  . Back pain 03/09/2016  . CVA (cerebral infarction) 01/30/2015  . Osteoporosis 11/29/2014  . Pelvic rim fracture (HCC) 09/28/2014  . Pelvic fracture (HCC) 09/27/2014  . VTE (venous thromboembolism) 08/09/2014  . Cellulitis 08/09/2014  . Chronic venous stasis dermatitis of both lower extremities 08/08/2014  . Cellulitis of left leg 07/24/2012  . Hypothyroidism 07/24/2012  . Glaucoma 07/24/2012  . Tinea pedis 07/24/2012  . GERD (gastroesophageal reflux disease) 07/24/2012     Past Surgical History:  Procedure Laterality Date  . ABDOMINAL SURGERY    . CATARACT EXTRACTION    . HEMORRHOID SURGERY      OB History    No data available       Home Medications    Prior to Admission medications   Medication Sig Start Date End Date Taking? Authorizing Provider  amLODipine (NORVASC) 5 MG tablet Take 1 tablet (5 mg total) by mouth daily. 01/31/15   Erick BlinksJehanzeb Memon, MD  aspirin EC 81 MG EC tablet Take 1 tablet (81 mg total) by mouth daily. 01/31/15   Erick BlinksJehanzeb Memon, MD  calcitonin, salmon, (MIACALCIN/FORTICAL) 200 UNIT/ACT nasal spray Place 1 spray into alternate nostrils daily.  01/24/15   Historical Provider, MD  Calcium Carbonate-Vitamin D (CALCIUM 600 + D PO) Take 1 tablet by mouth every morning.     Historical Provider, MD  cetaphil (CETAPHIL) cream Apply topical to legs lightly  twice a day    Historical Provider, MD  Cholecalciferol (VITAMIN D3) 1000 UNITS CAPS Take 1 capsule by mouth every morning.     Historical Provider, MD  gabapentin (NEURONTIN) 100 MG capsule Take 1 capsule (100 mg total) by mouth 2 (two) times daily. 07/23/16   Pecola LawlessWilliam F Hopper, MD  ibuprofen (ADVIL,MOTRIN) 200 MG tablet Take 200 mg by mouth daily.    Historical Provider, MD  ibuprofen (ADVIL,MOTRIN) 200 MG tablet Take 200 mg by  mouth every 6 (six) hours as needed.    Historical Provider, MD  levothyroxine (SYNTHROID, LEVOTHROID) 75 MCG tablet Take 75 mcg by mouth daily before breakfast.    Historical Provider, MD  polycarbophil (FIBERCON) 625 MG tablet Take 625 mg by mouth at bedtime.    Historical Provider, MD  polyethylene glycol (MIRALAX / GLYCOLAX) packet Take 17 g by mouth daily.     Historical Provider, MD  ranitidine (ZANTAC) 150 MG tablet Take 150 mg by mouth at bedtime.    Historical Provider, MD  timolol (BETIMOL) 0.5 % ophthalmic solution Place 1 drop into both eyes at bedtime.    Historical Provider, MD  traMADol (ULTRAM) 50 MG tablet Take 50 mg by mouth every 8 (eight) hours  as needed.    Historical Provider, MD    Family History Family History  Problem Relation Age of Onset  . Cancer Sister     unknown  . Cancer Sister     unknown  . Coronary artery disease Father   . Cancer Mother     died age 19    Social History Social History  Substance Use Topics  . Smoking status: Never Smoker  . Smokeless tobacco: Never Used  . Alcohol use No     Allergies   Latex; Onion; Other; Penicillins; and Sulfa antibiotics   Review of Systems Review of Systems  Unable to perform ROS: Mental status change  Constitutional: Negative for fever.  Respiratory: Negative for shortness of breath.   Cardiovascular: Positive for syncope. Negative for chest pain.  Gastrointestinal: Negative for vomiting.  Genitourinary: Negative for dysuria.  Neurological: Negative for headaches.  Psychiatric/Behavioral: Behavioral problem: level 5 caveat, limited ros due to patient ms/inability to respond to questions.     Physical Exam Updated Vital Signs BP 122/74 (BP Location: Left Arm)   Pulse 82   Temp 97.8 F (36.6 C) (Oral)   Resp 20   Wt 66.2 kg   SpO2 93%   BMI 26.70 kg/m   Physical Exam  Constitutional: She appears well-developed and well-nourished. No distress.  HENT:  Nose: Nose normal.  Mouth/Throat: Oropharynx is clear and moist.  Eyes: Conjunctivae are normal. Pupils are equal, round, and reactive to light. No scleral icterus.  Neck: Neck supple. No tracheal deviation present.  No stiffness or rigidity  Cardiovascular: Normal rate, regular rhythm, normal heart sounds and intact distal pulses.   Pulmonary/Chest: Effort normal and breath sounds normal. No respiratory distress.  Abdominal: Soft. Normal appearance and bowel sounds are normal. She exhibits no distension. There is no tenderness.  Genitourinary:  Genitourinary Comments: No cva tenderness  Musculoskeletal: She exhibits no edema.  Neurological: She is alert.  Patient appears awake and  alert. Is very HOH (at baseline). Speech quiet, limited (also reported at baseline). Moves bil ext purposefully, equal grip.   Skin: Skin is warm and dry. No rash noted. She is not diaphoretic. No erythema.  Psychiatric: She has a normal mood and affect.  Nursing note and vitals reviewed.    ED Treatments / Results  Labs (all labs ordered are listed, but only abnormal results are displayed) Results for orders placed or performed during the hospital encounter of August 21, 2016  Basic metabolic panel  Result Value Ref Range   Sodium 132 (L) 135 - 145 mmol/L   Potassium 4.1 3.5 - 5.1 mmol/L   Chloride 98 (L) 101 - 111 mmol/L   CO2 29 22 - 32 mmol/L   Glucose, Bld 109 (H) 65 -  99 mg/dL   BUN 21 (H) 6 - 20 mg/dL   Creatinine, Ser 4.54 0.44 - 1.00 mg/dL   Calcium 9.0 8.9 - 09.8 mg/dL   GFR calc non Af Amer 51 (L) >60 mL/min   GFR calc Af Amer 60 (L) >60 mL/min   Anion gap 5 5 - 15  CBC  Result Value Ref Range   WBC 6.5 4.0 - 10.5 K/uL   RBC 4.48 3.87 - 5.11 MIL/uL   Hemoglobin 13.6 12.0 - 15.0 g/dL   HCT 11.9 14.7 - 82.9 %   MCV 91.7 78.0 - 100.0 fL   MCH 30.4 26.0 - 34.0 pg   MCHC 33.1 30.0 - 36.0 g/dL   RDW 56.2 13.0 - 86.5 %   Platelets 340 150 - 400 K/uL  Urinalysis, Routine w reflex microscopic (not at [facility])  Result Value Ref Range   Color, Urine YELLOW YELLOW   APPearance CLEAR CLEAR   Specific Gravity, Urine 1.010 1.005 - 1.030   pH 6.0 5.0 - 8.0   Glucose, UA NEGATIVE NEGATIVE mg/dL   Hgb urine dipstick NEGATIVE NEGATIVE   Bilirubin Urine NEGATIVE NEGATIVE   Ketones, ur NEGATIVE NEGATIVE mg/dL   Protein, ur NEGATIVE NEGATIVE mg/dL   Nitrite NEGATIVE NEGATIVE   Leukocytes, UA SMALL (A) NEGATIVE  Urine microscopic-add on  Result Value Ref Range   Squamous Epithelial / LPF 0-5 (A) NONE SEEN   WBC, UA 6-30 0 - 5 WBC/hpf   RBC / HPF NONE SEEN 0 - 5 RBC/hpf   Bacteria, UA MANY (A) NONE SEEN   No results found.  EKG  EKG Interpretation  Date/Time:  Friday August 09 2016 15:14:37 EDT Ventricular Rate:  82 PR Interval:    QRS Duration: 86 QT Interval:  382 QTC Calculation: 447 R Axis:   61 Text Interpretation:  Sinus rhythm Normal ECG No significant change since last tracing Confirmed by Denton Lank  MD, Caryn Bee ([id number]) on 08/25/2016 3:36:56 PM       Radiology No results found.  Procedures Procedures (including critical care time)  Medications Ordered in ED Medications - No data to display   Initial Impression / Assessment and Plan / ED Course  I have reviewed the triage vital signs and the nursing notes.  Pertinent labs & imaging results that were available during my care of the patient were reviewed by me and considered in my medical decision making (see chart for details).  Clinical Course   Labs sent.   Reviewed nursing notes and prior charts for additional history.   DNR papers accompany patient from ECF.   ua with 6-30 wbc and many bacteria. Will cx and rx. Keflex po.   Patient in ECF. On recheck vitals remain normal, and mental status remains c/w baseline.  Patient currently appears stable for d/c. Family agreeable w plan.   Final Clinical Impressions(s) / ED Diagnoses   Final diagnoses:  None    New Prescriptions New Prescriptions   No medications on file     Cathren Laine, MD 2016-08-25 1845

## 2016-08-09 NOTE — Discharge Instructions (Signed)
It was our pleasure to provide your ER care today - we hope that you feel better.  Rest. Drink adequate fluids.  Take keflex (antibiotic) as prescribed for suspected urine infection.  Follow up with your doctor in the next few days.  Return to ER if worse, new symptoms, persistent vomiting, trouble breathing, change in mental status, other concern.

## 2016-08-09 NOTE — ED Triage Notes (Signed)
Pt sent from [facility]. Report given that pt was normal at lunch and when son came in he was unable to wake her. Head hanging over and then aroused with slurred speech. Per son back to baseline upon arrival to ED. CBG 128

## 2016-08-09 NOTE — Progress Notes (Signed)
Location:   [facility] Room Number: 142/D Place of Service:  SNF [phone](31) Provider:  Abigail MiyamotoArlo Chelise Hanger  William Hopper, MD  Patient Care Team: Pecola LawlessWilliam F Hopper, MD as PCP - General (Internal Medicine)  Extended Emergency Contact Information Primary Emergency Contact: Romain,Nathan Address: [address]          [place], [place] Home Phone: [phone] Mobile Phone: [phone] Relation: Son  Code Status:  DNR Goals of care: Advanced Directive information Advanced Directives 08/15/2016  Does patient have an advance directive? Yes  Type of Advance Directive Out of facility DNR (pink MOST or yellow form)  Does patient want to make changes to advanced directive? No - Patient declined  Copy of advanced directive(s) in chart? Yes  Would patient like information on creating an advanced directive? -  Pre-existing out of facility DNR order (yellow form or pink MOST form) -     Chief Complaint  Patient presents with  . Acute Visit  Secondary to change in mental status weakness  HPI:  Pt is a [age] y.o. female seen today for an acute visit for an acute change in mental status and increased weakness.  Patient was in her usual state of health earlier today but apparently had a fairly sudden onset she became increasingly weak and confused.  She is currently lying in bed vital signs appear to be stable O2 saturation is satisfactory as well as blood sugar-she normally can recognize me but is apparently having trouble doing that today-she appears to recognize her son somewhat who is in the room.  Patient is 82 years old but has been remarkably stable she has at times had back pain but this appears to be doing a bit better she does not have an extensive cardiac history.  She currently not complaining of any chest pain or shortness of breath although again she appears significantly confused which is not her  baseline.               Past Medical History:  Diagnosis Date  . Back pain   . Confusion   . DNR (do not resuscitate)   . GERD (gastroesophageal reflux disease)   . Glaucoma   . HOH (hard of hearing)   . Pelvic fracture (HCC)    snf  . Thyroid disease    Past Surgical History:  Procedure Laterality Date  . ABDOMINAL SURGERY    . CATARACT EXTRACTION    . HEMORRHOID SURGERY      Allergies  Allergen Reactions  . Latex Itching  . Onion Nausea And Vomiting  . Other     SPANDEX ALLERGY  . Penicillins Other (See Comments)    UNKNOWN REACTION  . Sulfa Antibiotics Other (See Comments)    UNKNOWN REACTION      Medication List       Accurate as of 08/08/2016  3:34 PM. Always use your most recent med list.          amLODipine 5 MG tablet Commonly known as:  NORVASC Take 1 tablet (5 mg total) by mouth daily.   aspirin 81 MG EC tablet Take 1 tablet (81 mg total) by mouth daily.   calcitonin (salmon) 200 UNIT/ACT nasal spray Commonly known as:  MIACALCIN/FORTICAL Place 1 spray into alternate nostrils daily.   CALCIUM 600 + D PO Take 1 tablet by mouth every morning.   cetaphil cream Apply topical to legs lightly  twice a day   gabapentin  100 MG capsule Commonly known as:  NEURONTIN Take 1 capsule (100 mg total) by mouth 2 (two) times daily.   ibuprofen 200 MG tablet Commonly known as:  ADVIL,MOTRIN Take 200 mg by mouth daily.   ibuprofen 200 MG tablet Commonly known as:  ADVIL,MOTRIN Take 200 mg by mouth every 6 (six) hours as needed.   levothyroxine 75 MCG tablet Commonly known as:  SYNTHROID, LEVOTHROID Take 75 mcg by mouth daily before breakfast.   polycarbophil 625 MG tablet Commonly known as:  FIBERCON Take 625 mg by mouth at bedtime.   polyethylene glycol packet Commonly known as:  MIRALAX / GLYCOLAX Take 17 g by mouth daily.   ranitidine 150 MG tablet Commonly known as:  ZANTAC Take 150 mg by mouth at bedtime.   timolol 0.5 %  ophthalmic solution Commonly known as:  BETIMOL Place 1 drop into both eyes at bedtime.   traMADol 50 MG tablet Commonly known as:  ULTRAM Take 50 mg by mouth every 8 (eight) hours as needed.   Vitamin D3 1000 units Caps Take 1 capsule by mouth every morning.       Review of Systems   Again this is limited secondary to patient's altered mental status-but she does not appear to be complaining of any chest pain or shortness of breath headache or dizziness  Immunization History  Administered Date(s) Administered  . Influenza-Unspecified 10/05/2014  . PPD Test 02/14/2015  . Pneumococcal-Unspecified 12/30/1978   Pertinent  Health Maintenance Due  Topic Date Due  . INFLUENZA VACCINE  07/30/2016  . DEXA SCAN  05/22/2017 (Originally 04/14/1979)  . PNA vac Low Risk Adult (1 of 2 - PCV13) 05/22/2017 (Originally 12/31/1979)   No flowsheet data found. Functional Status Survey:    Vitals:   08/19/2016 1534  BP: 119/69  Pulse: 84  Resp: 18  SpO2: 93%   There is no height or weight on file to calculate BMI. Physical Exam In general this is a frail elderly female in no distress but she has significant confusion and appears to be weak.  Her skin is warm and dry she does have numerous solar induced changes diffuse.  She is able to open her mouth-mucous membranes appear to be slightly dry.  Eyes pupils are reactive to light sclera and conjunctivae are clear.  Visual acuity appears to be roughly within normal range.  Heart is regular rate and rhythm without murmur gallop or rub she appears to have baseline lower extremity edema venous stasis changes this is fairly mild.  Chest is clear to auscultation with poor effort there is no labored breathing.  Abdomen is soft nontender with positive bowel sounds.  Muscle skeletal is able to move all extremities 4 is able to hold her arms up but her grip strength is quite weak bilaterally.  Neurologic I could not really see lateralizing  findings although she has generalized weakness. Babinski appears to be downgoing bilaterally she is speaking minimally.  Psych she is able to speak but is not herself appears to be confused appears to recognize her son does not really recognize me.   Labs reviewed:  Recent Labs  01/14/16 1609 03/05/16 0800 04/08/16 0600  NA 135 135 137  K 4.5 4.2 4.3  CL 97* 96* 101  CO2 GLUCOSE 103* 103* 93  BUN 27* 20 20  CREATININE 0.90 0.89 0.82  CALCIUM 9.8 9.6 9.1    Recent Labs  12/14/15 0815 03/05/16 0800 04/08/16 0600  AST 18 20  15  ALT 11* 17 13*  ALKPHOS 62 67 51  BILITOT 0.4 0.7 0.4  PROT 7.8 8.0 6.7  ALBUMIN 3.9 4.0 3.5    Recent Labs  12/14/15 0815 01/14/16 1609 03/05/16 0800 04/08/16 0600  WBC 5.6 8.6 5.7 5.1  NEUTROABS 3.0 4.4 2.8  --   HGB 14.3 13.8 14.8 12.8  HCT 43.2 42.6 45.6 38.9  MCV 90.8 90.3 90.1 89.6  PLT 343 357 344 340   Lab Results  Component Value Date   TSH 3.935 02/16/2016   Lab Results  Component Value Date   HGBA1C 5.8 (H) 01/30/2015   Lab Results  Component Value Date   CHOL 105 01/30/2015   HDL 44 01/30/2015   LDLCALC 45 01/30/2015   TRIG 78 01/30/2015   CHOLHDL 2.4 01/30/2015    Significant Diagnostic Results in last 30 days:  No results found.  Assessment/Plan #1 change in mental status with weakness-apparently some unresponsiveness initially.  This has been an acute change in a short period of time-one would be concerned possibly about a neurologic event-although cannot rule out infection in this instance it appears she does have some history of UTIs in the past.  Secondary to the acute change Will send her to the ER for expedient evaluation.  Per serial exams patient appeared to be somewhat stronger and more alert but still certainly not near her baseline.  [id number]      London Sheer, CMA [phone]

## 2016-08-09 NOTE — ED Notes (Signed)
[facility] and family member.

## 2016-08-28 ENCOUNTER — Non-Acute Institutional Stay (SKILLED_NURSING_FACILITY): Payer: 59 | Admitting: Internal Medicine

## 2016-08-28 ENCOUNTER — Encounter: Payer: Self-pay | Admitting: Internal Medicine

## 2016-08-28 DIAGNOSIS — E038 Other specified hypothyroidism: Secondary | ICD-10-CM

## 2016-08-28 DIAGNOSIS — I10 Essential (primary) hypertension: Secondary | ICD-10-CM | POA: Diagnosis not present

## 2016-08-28 DIAGNOSIS — M5489 Other dorsalgia: Secondary | ICD-10-CM

## 2016-08-28 DIAGNOSIS — M810 Age-related osteoporosis without current pathological fracture: Secondary | ICD-10-CM

## 2016-08-28 NOTE — Progress Notes (Signed)
Location:   [facility] Room Number: 142/D Place of Service:  SNF [phone](31) Provider:  Abigail MiyamotoArlo Malisa Ruggiero  William Hopper, MD  Patient Care Team: Pecola LawlessWilliam F Hopper, MD as PCP - General (Internal Medicine)  Extended Emergency Contact Information Primary Emergency Contact: Eisler,Nathan Address: [address]          [place], [place] Home Phone: [phone] Mobile Phone: [phone] Relation: Son  Code Status:  DNR Goals of care: Advanced Directive information Advanced Directives 08/28/2016  Does patient have an advance directive? Yes  Type of Advance Directive Out of facility DNR (pink MOST or yellow form)  Does patient want to make changes to advanced directive? No - Patient declined  Copy of advanced directive(s) in chart? Yes  Would patient like information on creating an advanced directive? -  Pre-existing out of facility DNR order (yellow form or pink MOST form) -     Chief Complaint  Patient presents with  . Medical Management of Chronic Issues    Routine Visit   Including back pain-CHF-hypertension-hypothyroidism-  HPI:  Pt is a [age] y.o. female seen today for medical management of chronic diseases.  She appears to be stable which has been the case was some time.  She was seen earlier this month for increased lethargy and somnolence-she did go to the ER where workup was fairly unremarkable-lab work did not appear to be grossly concerning and in fact at the time she got to the ER apparently was more responsive alert and closer to her baseline.  She continues to do well with supportive care she is sitting in her wheelchair today eating her dinner and appears to have a pretty good appetite.  She has no acute complaints.  She does have a history of lower extremity edema and diastolic CHF however has been resistant to wearing TED hose nonetheless her edema actually looks improved today.  She does have a history of back pain  continues on tramadol when necessary as well as ibuprofen and Neurontin and this appears to be controlled currently as well.  She does have a history of compression deformities T11-L2-also disc space narrowing at L3-L4-L5.      Past Medical History:  Diagnosis Date  . Back pain   . Confusion   . DNR (do not resuscitate)   . GERD (gastroesophageal reflux disease)   . Glaucoma   . HOH (hard of hearing)   . Pelvic fracture (HCC)    snf  . Thyroid disease    Past Surgical History:  Procedure Laterality Date  . ABDOMINAL SURGERY    . CATARACT EXTRACTION    . HEMORRHOID SURGERY      Allergies  Allergen Reactions  . Latex Itching  . Onion Nausea And Vomiting  . Other     SPANDEX ALLERGY  . Penicillins Other (See Comments)    UNKNOWN REACTION  . Sulfa Antibiotics Other (See Comments)    UNKNOWN REACTION      Medication List       Accurate as of 08/28/16  4:26 PM. Always use your most recent med list.          amLODipine 5 MG tablet Commonly known as:  NORVASC Take 1 tablet (5 mg total) by mouth daily.   aspirin 81 MG EC tablet Take 1 tablet (81 mg total) by mouth daily.   calcitonin (salmon) 200 UNIT/ACT nasal spray Commonly known as:  MIACALCIN/FORTICAL Place 1 spray into alternate nostrils daily.  CALCIUM 600 + D PO Take 1 tablet by mouth every morning.   cephALEXin 500 MG capsule Commonly known as:  KEFLEX Take 1 capsule (500 mg total) by mouth 3 (three) times daily.   cetaphil cream Apply topical to legs lightly  twice a day   gabapentin 100 MG capsule Commonly known as:  NEURONTIN Take 1 capsule (100 mg total) by mouth 2 (two) times daily.   ibuprofen 200 MG tablet Commonly known as:  ADVIL,MOTRIN Take 200 mg by mouth daily.   ibuprofen 200 MG tablet Commonly known as:  ADVIL,MOTRIN Take 200 mg by mouth every 6 (six) hours as needed.   levothyroxine 75 MCG tablet Commonly known as:  SYNTHROID, LEVOTHROID Take 75 mcg by mouth daily before  breakfast.   polycarbophil 625 MG tablet Commonly known as:  FIBERCON Take 625 mg by mouth at bedtime.   polyethylene glycol packet Commonly known as:  MIRALAX / GLYCOLAX Take 17 g by mouth daily.   ranitidine 150 MG tablet Commonly known as:  ZANTAC Take 150 mg by mouth at bedtime.   timolol 0.5 % ophthalmic solution Commonly known as:  BETIMOL Place 1 drop into both eyes at bedtime.   traMADol 50 MG tablet Commonly known as:  ULTRAM Take 50 mg by mouth every 8 (eight) hours as needed.   Vitamin D3 1000 units Caps Take 1 capsule by mouth every morning.       Review of Systems   In general she is not complaining of any fever or chills. SKIN does not complain of rashes or itching she does have baseline solar induced changes in a history of right leg cellulitis at times with this is been stable for some time.  Head ears eyes nose mouth and throat does not complain of any visual changes sore throat.  Respiratory does not complaining of any shortness breath or cough.  Cardiac no chest pain continue with some mild lower extremity edema.  GI is not complaining of abdominal pain nausea vomiting diarrhea constipation or dysphagia.  Muscle skeletal not complaining of joint or back pain at this time .  Neurologic complaining of dizziness headache or syncopal-type feelings.  Psychiatric does not complain of depression or anxiety   Immunization History  Administered Date(s) Administered  . Influenza-Unspecified 10/05/2014  . PPD Test 02/14/2015  . Pneumococcal-Unspecified 12/30/1978   Pertinent  Health Maintenance Due  Topic Date Due  . INFLUENZA VACCINE  11/29/2016 (Originally 07/30/2016)  . DEXA SCAN  05/22/2017 (Originally 04/14/1979)  . PNA vac Low Risk Adult (1 of 2 - PCV13) 05/22/2017 (Originally 12/31/1979)   No flowsheet data found. Functional Status Survey:    Vitals:   08/28/16 1618  BP: (!) 107/44  Pulse: 72  Resp: 20  Temp: 97 F (36.1 C)    TempSrc: Oral  Weight: 144 lb 3.2 oz (65.4 kg)   Body mass index is 26.37 kg/m. Physical Exam  In general this is a pleasant elderly female in no distress sitting comfortably in her wheelchair eating  Her skin is warm and dry she does have solar induced changes on her arms and face continues with venous stasis changes of her lower legs bilaterally.--I do not see any evidence of cellulitis  Eyes pupils appear reactive light sclerae and neck are clear visual acuity appears grossly intact.  Oropharynx clear mucous membranes moist.  Chest is clear to auscultation somewhat shallow entry there is no labored breathing.  Abdomen is soft nontender with positive bowel sounds.  Muscle  skeletal is able to move all extremities 4 -ambulates largely in a wheelchair I do not note arthritic changes diffuse but no acute changes again she does walk with a walker at times with restorative.  Neurologic grossly intact her speech is clear no lateralizing findings.  Psych she is alert and grossly oriented conversational a very pleasant individual -always expresses her appreciation for being seen-.  Labs reviewed:  Recent Labs  03/05/16 0800 04/08/16 0600 08/01/2016 1559  NA 135 137 132*  K 4.2 4.3 4.1  CL 96* 101 98*  CO2 30 30 29   GLUCOSE 103* 93 109*  BUN 20 20 21*  CREATININE 0.89 0.82 0.88  CALCIUM 9.6 9.1 9.0    Recent Labs  12/14/15 0815 03/05/16 0800 04/08/16 0600  AST 18 20 15   ALT 11* 17 13*  ALKPHOS 62 67 51  BILITOT 0.4 0.7 0.4  PROT 7.8 8.0 6.7  ALBUMIN 3.9 4.0 3.5    Recent Labs  12/14/15 0815 01/14/16 1609 03/05/16 0800 04/08/16 0600 08/23/2016 1559  WBC 5.6 8.6 5.7 5.1 6.5  NEUTROABS 3.0 4.4 2.8  --   --   HGB 14.3 13.8 14.8 12.8 13.6  HCT 43.2 42.6 45.6 38.9 41.1  MCV 90.8 90.3 90.1 89.6 91.7  PLT 343 357 344 340 340   Lab Results  Component Value Date   TSH 3.935 02/16/2016   Lab Results  Component Value Date   HGBA1C 5.8 (H) 01/30/2015   Lab  Results  Component Value Date   CHOL 105 01/30/2015   HDL 44 01/30/2015   LDLCALC 45 01/30/2015   TRIG 78 01/30/2015   CHOLHDL 2.4 01/30/2015    Significant Diagnostic Results in last 30 days:  No results found.  Assessment/Plan  #1 history of back pain-as noted above she has significant orthopedic issues with this appears relatively well controlled on the tramadol when necessary as well as ibuprofen and Neurontin.  #2 history CHF diastolic this appears stable her edema actually is somewhat improved today on exam she does not want to wear TED hose nonetheless again the edema appears stable  \#3-history hypertension-this appears stable on Norvasc recent blood pressures 107/44-117/77.  #4-hypothyroidism she is now on Synthroid we will check a TSH-TSH back in February 2017 was 3.935.  #5 history of somnolence prompted ER visit earlier this month-again workup appear to be largely negative she was put on Keflex for a possible UTI which she has completed she does not complain of dysuria.  #6 osteoporosis continues on calcium supplementation.  #7 history of GERD-like symptoms she continues on ranitidine and this appears to be stable as well.  [id number]

## 2016-08-29 ENCOUNTER — Other Ambulatory Visit (HOSPITAL_COMMUNITY)
Admission: AD | Admit: 2016-08-29 | Discharge: 2016-08-29 | Disposition: A | Discharge status: Home / Self Care | Payer: 59 | Source: Skilled Nursing Facility | Attending: Internal Medicine | Admitting: Internal Medicine

## 2016-08-29 DIAGNOSIS — I639 Cerebral infarction, unspecified: Secondary | ICD-10-CM | POA: Insufficient documentation

## 2016-08-29 LAB — TSH: TSH: 1.676 u[IU]/mL (ref 0.350–4.500)

## 2016-08-30 DEATH — deceased

## 2016-09-26 ENCOUNTER — Other Ambulatory Visit: Payer: Self-pay | Admitting: *Deleted

## 2016-09-26 MED ORDER — TRAMADOL HCL 50 MG PO TABS: ORAL_TABLET | ORAL | 0 refills | Status: DC

## 2016-09-26 NOTE — Telephone Encounter (Signed)
[facility] #[phone] Fax: [phone]   

## 2016-09-27 ENCOUNTER — Encounter: Payer: Self-pay | Admitting: Internal Medicine

## 2016-09-27 ENCOUNTER — Non-Acute Institutional Stay (SKILLED_NURSING_FACILITY): Payer: 59 | Admitting: Internal Medicine

## 2016-09-27 DIAGNOSIS — L989 Disorder of the skin and subcutaneous tissue, unspecified: Secondary | ICD-10-CM

## 2016-09-27 NOTE — Progress Notes (Signed)
Location:   [facility] Room Number: 142/D Place of Service:  SNF (31) Provider:  Edmon Crape  No primary care provider on file.  Patient Care Team: Mahlon Gammon, MD as Consulting Physician (Geriatric)  Extended Emergency Contact Information Primary Emergency Contact: Vallejo,Nathan Address: [address]          [place], [place] Home Phone: [phone] Mobile Phone: [phone] Relation: Son  Code Status:  DNR Goals of care: Advanced Directive information Advanced Directives 09/27/2016  Does patient have an advance directive? Yes  Type of Advance Directive Out of facility DNR (pink MOST or yellow form)  Does patient want to make changes to advanced directive? No - Patient declined  Copy of advanced directive(s) in chart? Yes  Would patient like information on creating an advanced directive? -  Pre-existing out of facility DNR order (yellow form or pink MOST form) -     Chief Complaint  Patient presents with  . Acute Visit    Spot found on right hand per family member    HPI:  Pt is a 82 y.o. female seen today for an acute visit for    Past Medical History:  Diagnosis Date  . Back pain   . Confusion   . DNR (do not resuscitate)   . GERD (gastroesophageal reflux disease)   . Glaucoma   . HOH (hard of hearing)   . Pelvic fracture (HCC)    snf  . Thyroid disease    Past Surgical History:  Procedure Laterality Date  . ABDOMINAL SURGERY    . CATARACT EXTRACTION    . HEMORRHOID SURGERY      Allergies  Allergen Reactions  . Latex Itching  . Onion Nausea And Vomiting  . Other     SPANDEX ALLERGY  . Penicillins Other (See Comments)    UNKNOWN REACTION  . Sulfa Antibiotics Other (See Comments)    UNKNOWN REACTION      Medication List       Accurate as of 09/27/16 10:13 AM. Always use your most recent med list.          amLODipine 5 MG tablet Commonly known as:  NORVASC Take 1 tablet (5  mg total) by mouth daily.   aspirin 81 MG EC tablet Take 1 tablet (81 mg total) by mouth daily.   calcitonin (salmon) 200 UNIT/ACT nasal spray Commonly known as:  MIACALCIN/FORTICAL Place 1 spray into alternate nostrils daily.   CALCIUM 600 + D PO Take 1 tablet by mouth every morning.   cetaphil cream Apply topical to legs lightly  twice a day   gabapentin 100 MG capsule Commonly known as:  NEURONTIN Take 1 capsule (100 mg total) by mouth 2 (two) times daily.   ibuprofen 200 MG tablet Commonly known as:  ADVIL,MOTRIN Take 200 mg by mouth daily.   ibuprofen 200 MG tablet Commonly known as:  ADVIL,MOTRIN Take 200 mg by mouth every 6 (six) hours as needed.   levothyroxine 75 MCG tablet Commonly known as:  SYNTHROID, LEVOTHROID Take 75 mcg by mouth daily before breakfast.   polycarbophil 625 MG tablet Commonly known as:  FIBERCON Take 625 mg by mouth at bedtime.   polyethylene glycol packet Commonly known as:  MIRALAX / GLYCOLAX Take 17 g by mouth daily.   ranitidine 150 MG tablet Commonly known as:  ZANTAC Take 150 mg by mouth at bedtime.   timolol 0.5 % ophthalmic solution Commonly known as:  BETIMOL Place 1 drop into both eyes at bedtime.   traMADol 50 MG tablet Commonly known as:  ULTRAM Take one tablet by mouth every 8 hours as needed for moderate to severe pain   Vitamin D3 1000 units Caps Take 1 capsule by mouth every morning.       Review of Systems  Immunization History  Administered Date(s) Administered  . Influenza-Unspecified 10/05/2014  . PPD Test 02/14/2015  . Pneumococcal-Unspecified 12/30/1978   Pertinent  Health Maintenance Due  Topic Date Due  . INFLUENZA VACCINE  11/29/2016 (Originally 07/30/2016)  . DEXA SCAN  05/22/2017 (Originally 04/14/1979)  . PNA vac Low Risk Adult (1 of 2 - PCV13) 05/22/2017 (Originally 12/31/1979)   No flowsheet data found. Functional Status Survey:    There were no vitals filed for this visit. There is no  height or weight on file to calculate BMI. Physical Exam  Labs reviewed:  Recent Labs  03/05/16 0800 04/08/16 0600 2016-04-18 1559  NA 135 137 132*  K 4.2 4.3 4.1  CL 96* 101 98*  CO2 30 30 29   GLUCOSE 103* 93 109*  BUN 20 20 21*  CREATININE 0.89 0.82 0.88  CALCIUM 9.6 9.1 9.0    Recent Labs  12/14/15 0815 03/05/16 0800 04/08/16 0600  AST 18 20 15   ALT 11* 17 13*  ALKPHOS 62 67 51  BILITOT 0.4 0.7 0.4  PROT 7.8 8.0 6.7  ALBUMIN 3.9 4.0 3.5    Recent Labs  12/14/15 0815 01/14/16 1609 03/05/16 0800 04/08/16 0600 2016-04-18 1559  WBC 5.6 8.6 5.7 5.1 6.5  NEUTROABS 3.0 4.4 2.8  --   --   HGB 14.3 13.8 14.8 12.8 13.6  HCT 43.2 42.6 45.6 38.9 41.1  MCV 90.8 90.3 90.1 89.6 91.7  PLT 343 357 344 340 340   Lab Results  Component Value Date   TSH 1.676 08/29/2016   Lab Results  Component Value Date   HGBA1C 5.8 (H) 01/30/2015   Lab Results  Component Value Date   CHOL 105 01/30/2015   HDL 44 01/30/2015   LDLCALC 45 01/30/2015   TRIG 78 01/30/2015   CHOLHDL 2.4 01/30/2015    Significant Diagnostic Results in last 30 days:  No results found.  Assessment/Plan There are no diagnoses linked to this encounter.      London SheerLuster, Caedan Sumler C, [place] [phone]   This encounter was created in error - please disregard.

## 2016-09-27 NOTE — Progress Notes (Signed)
Location:   [facility] Room Number: 142/D Place of Service:  SNF (31) Provider:  Edmon CrapeArlo Kylon Philbrook  No primary care provider on file.  Patient Care Team: Mahlon GammonAnjali L Gupta, MD as Consulting Physician Tori Milks([facility])  Extended Emergency Contact Information Primary Emergency Contact: Barona,Nathan Address: [address]          [place], [place] Home Phone: [phone] Mobile Phone: [phone] Relation: Son  Code Status:  DNR Goals of care: Advanced Directive information Advanced Directives 09/27/2016  Does patient have an advance directive? Yes  Type of Advance Directive Out of facility DNR (pink MOST or yellow form)  Does patient want to make changes to advanced directive? No - Patient declined  Copy of advanced directive(s) in chart? Yes  Would patient like information on creating an advanced directive? -  Pre-existing out of facility DNR order (yellow form or pink MOST form) -     Chief Complaint  Patient presents with  . Acute Visit    Spot on right hand per family member    HPI:  Pt is a [age] y.o. female seen today for an acute visit for A lesion on her right hand.  Patient does have numerous solar induced changes on her face and arms especially-she states in this area on the right hand which is proximal third finger she used to have a lesion that apparently was removed by dermatology but she says this seems to have reappeared.  She is not complaining of any tenderness with this drainage or bleeding.  Otherwise she appears to be doing well she had been sent to the ER in late August with altered mental status there is been no reoccurrence and workup in the ER was essentially negative-L5 of the time she got to the ER apparently her mental status had neared baseline.     Past Medical History:  Diagnosis Date  . Back pain   . Confusion   . DNR (do not resuscitate)   . GERD (gastroesophageal reflux disease)   . Glaucoma    . HOH (hard of hearing)   . Pelvic fracture (HCC)    snf  . Thyroid disease    Past Surgical History:  Procedure Laterality Date  . ABDOMINAL SURGERY    . CATARACT EXTRACTION    . HEMORRHOID SURGERY      Allergies  Allergen Reactions  . Latex Itching  . Onion Nausea And Vomiting  . Other     SPANDEX ALLERGY  . Penicillins Other (See Comments)    UNKNOWN REACTION  . Sulfa Antibiotics Other (See Comments)    UNKNOWN REACTION      Medication List       Accurate as of 09/27/16 10:21 AM. Always use your most recent med list.          amLODipine 5 MG tablet Commonly known as:  NORVASC Take 1 tablet (5 mg total) by mouth daily.   aspirin 81 MG EC tablet Take 1 tablet (81 mg total) by mouth daily.   calcitonin (salmon) 200 UNIT/ACT nasal spray Commonly known as:  MIACALCIN/FORTICAL Place 1 spray into alternate nostrils daily.   CALCIUM 600 + D PO Take 1 tablet by mouth every morning.   cetaphil cream Apply topical to legs lightly  twice a day   gabapentin 100 MG capsule Commonly known as:  NEURONTIN Take 1 capsule (100 mg total) by mouth 2 (two) times daily.   ibuprofen 200 MG tablet Commonly  known as:  ADVIL,MOTRIN Take 200 mg by mouth daily.   ibuprofen 200 MG tablet Commonly known as:  ADVIL,MOTRIN Take 200 mg by mouth every 6 (six) hours as needed.   levothyroxine 75 MCG tablet Commonly known as:  SYNTHROID, LEVOTHROID Take 75 mcg by mouth daily before breakfast.   polycarbophil 625 MG tablet Commonly known as:  FIBERCON Take 625 mg by mouth at bedtime.   polyethylene glycol packet Commonly known as:  MIRALAX / GLYCOLAX Take 17 g by mouth daily.   ranitidine 150 MG tablet Commonly known as:  ZANTAC Take 150 mg by mouth at bedtime.   timolol 0.5 % ophthalmic solution Commonly known as:  BETIMOL Place 1 drop into both eyes at bedtime.   traMADol 50 MG tablet Commonly known as:  ULTRAM Take one tablet by mouth every 8 hours as needed for  moderate to severe pain   Vitamin D3 1000 units Caps Take 1 capsule by mouth every morning.       Review of Systems   In general does not complaining of fever or chills.  Skin issue as noted above.  Head ears eyes nose mouth and throat does not complain of any visual changes or sore throat.  Respiratory denies shortness breath or cough.  Cardiac no chest pain.  Muscle skeletal is not complaining of joint pain does have a history of back pain but this appears controlled on current medications including ibuprofen.  Neurologic not complain dizziness headache or numbness.    Immunization History  Administered Date(s) Administered  . Influenza-Unspecified 10/05/2014  . PPD Test 02/14/2015  . Pneumococcal-Unspecified 12/30/1978   Pertinent  Health Maintenance Due  Topic Date Due  . INFLUENZA VACCINE  11/29/2016 (Originally 07/30/2016)  . DEXA SCAN  05/22/2017 (Originally 04/14/1979)  . PNA vac Low Risk Adult (1 of 2 - PCV13) 05/22/2017 (Originally 12/31/1979)   No flowsheet data found. Functional Status Survey:    She is afebrile pulse 76 respirations 20 blood pressure 112/70.    Physical Exam   In general this is a pleasant elderly female in no distress sitting comfortably in her wheelchair   Her skin is warm and dry she does have solar induced changes on her arms and face continues with venous stasis changes of her lower legs bilaterally.-On partial right third finger there is a small area approximately half a centimeter in diameter that has crusted borders -- rounded appearance with some erythematous tissue inside-I do not really see any significant elevation other than the borders-there is not really any significant erythema extending from this it is not really tender to palpation there is no drainage    Eyes pupils appear reactive light sclerae and neck are clear visual acuity appears grossly intact.  Oropharynx clear mucous membranes moist.  Chest is clear to  auscultation somewhat shallow entry there is no labored breathing.     Muscle skeletal is able to move all extremities 4 -ambulates largely in a wheelchair I do not note arthritic changes diffuse but no acute changes again she does walk with a walker at times with restorative.  Neurologic grossly intact her speech is clear no lateralizing findings.  Psych she is alert and grossly oriented conversational a very pleasant individual -a.   Labs reviewed:  Recent Labs  03/05/16 0800 04/08/16 0600 2016-08-20 1559  NA 135 137 132*  K 4.2 4.3 4.1  CL 96* 101 98*  CO2 30 30 29   GLUCOSE 103* 93 109*  BUN 20 20 21*  CREATININE 0.89 0.82 0.88  CALCIUM 9.6 9.1 9.0    Recent Labs  12/14/15 0815 03/05/16 0800 04/08/16 0600  AST 18 20 15   ALT 11* 17 13*  ALKPHOS 62 67 51  BILITOT 0.4 0.7 0.4  PROT 7.8 8.0 6.7  ALBUMIN 3.9 4.0 3.5    Recent Labs  12/14/15 0815 01/14/16 1609 03/05/16 0800 04/08/16 0600 Aug 14, 2016 1559  WBC 5.6 8.6 5.7 5.1 6.5  NEUTROABS 3.0 4.4 2.8  --   --   HGB 14.3 13.8 14.8 12.8 13.6  HCT 43.2 42.6 45.6 38.9 41.1  MCV 90.8 90.3 90.1 89.6 91.7  PLT 343 357 344 340 340   Lab Results  Component Value Date   TSH 1.676 08/29/2016   Lab Results  Component Value Date   HGBA1C 5.8 (H) 01/30/2015   Lab Results  Component Value Date   CHOL 105 01/30/2015   HDL 44 01/30/2015   LDLCALC 45 01/30/2015   TRIG 78 01/30/2015   CHOLHDL 2.4 01/30/2015    Significant Diagnostic Results in last 30 days:  No results found.  Assessment/Plan  #1 right hand lesion-I suspect patient does have some history of skin carcinoma in the past with her extensive sun exposure as well as advanced age-apparently this area was of concern previously-will send her to dermatology for evaluation-I feel she would also benefit from full skin assessment as well. I do not see signs of infection cellulitis here but this will have to be watched  [id number]      London Sheer, [place] [phone]

## 2016-10-03 ENCOUNTER — Non-Acute Institutional Stay (SKILLED_NURSING_FACILITY): Payer: 59 | Admitting: Internal Medicine

## 2016-10-03 ENCOUNTER — Encounter: Payer: Self-pay | Admitting: Internal Medicine

## 2016-10-03 DIAGNOSIS — K219 Gastro-esophageal reflux disease without esophagitis: Secondary | ICD-10-CM | POA: Diagnosis not present

## 2016-10-03 DIAGNOSIS — M5489 Other dorsalgia: Secondary | ICD-10-CM | POA: Diagnosis not present

## 2016-10-03 DIAGNOSIS — E038 Other specified hypothyroidism: Secondary | ICD-10-CM

## 2016-10-03 DIAGNOSIS — L570 Actinic keratosis: Secondary | ICD-10-CM | POA: Diagnosis not present

## 2016-10-03 DIAGNOSIS — I10 Essential (primary) hypertension: Secondary | ICD-10-CM | POA: Diagnosis not present

## 2016-10-03 NOTE — Progress Notes (Signed)
Location:   [facility] Room Number: 142/D Place of Service:  SNF (31) Provider:  Anjali,Gupta  No primary care provider on file.  Patient Care Team: Mahlon Gammon, MD as Consulting Physician Tori Milks)  Extended Emergency Contact Information Primary Emergency Contact: Maultsby,Nathan Address: [address]          [place], [place] Home Phone: [phone] Mobile Phone: [phone] Relation: Son  Code Status:  DNR Goals of care: Advanced Directive information Advanced Directives 10/03/2016  Does patient have an advance directive? Yes  Type of Advance Directive Out of facility DNR (pink MOST or yellow form)  Does patient want to make changes to advanced directive? No - Patient declined  Copy of advanced directive(s) in chart? Yes  Would patient like information on creating an advanced directive? -  Pre-existing out of facility DNR order (yellow form or pink MOST form) -     Chief Complaint  Patient presents with  . Medical Management of Chronic Issues    Routine Visit    HPI:  Pt is a 82 y.o. female seen today for medical management of chronic diseases.  Patient is Long term resident of [facility]. She has had been very stable clinically. She did had one episode few months ago in which she was treated for UTI. Patient also has follow up appointment with Dermatologist for her skin tags as requested by her son. She is very hard of hearing but very pleasant. Denied any acute problems. Her weight has been stable at 144 lbs for past few months. She was started by Neurontin and Ultram PRN for her back pian and she has tolerated that well. She does not seem to be in any discomfort.   Past Medical History:  Diagnosis Date  . Back pain   . Confusion   . DNR (do not resuscitate)   . GERD (gastroesophageal reflux disease)   . Glaucoma   . HOH (hard of hearing)   . Pelvic fracture (HCC)    snf  . Thyroid disease    Past  Surgical History:  Procedure Laterality Date  . ABDOMINAL SURGERY    . CATARACT EXTRACTION    . HEMORRHOID SURGERY      Allergies  Allergen Reactions  . Latex Itching  . Onion Nausea And Vomiting  . Other     SPANDEX ALLERGY  . Penicillins Other (See Comments)    UNKNOWN REACTION  . Sulfa Antibiotics Other (See Comments)    UNKNOWN REACTION      Medication List       Accurate as of 10/03/16  9:54 AM. Always use your most recent med list.          amLODipine 5 MG tablet Commonly known as:  NORVASC Take 1 tablet (5 mg total) by mouth daily.   aspirin 81 MG EC tablet Take 1 tablet (81 mg total) by mouth daily.   calcitonin (salmon) 200 UNIT/ACT nasal spray Commonly known as:  MIACALCIN/FORTICAL Place 1 spray into alternate nostrils daily.   CALCIUM 600 + D PO Take 1 tablet by mouth every morning.   cetaphil cream Apply topical to legs lightly  twice a day   gabapentin 100 MG capsule Commonly known as:  NEURONTIN Take 1 capsule (100 mg total) by mouth 2 (two) times daily.   ibuprofen 200 MG tablet Commonly known as:  ADVIL,MOTRIN Take 200 mg by mouth daily.   ibuprofen 200 MG tablet Commonly known as:  ADVIL,MOTRIN Take 200 mg by mouth every 6 (six) hours as needed.   levothyroxine 75 MCG tablet Commonly known as:  SYNTHROID, LEVOTHROID Take 75 mcg by mouth daily before breakfast.   polycarbophil 625 MG tablet Commonly known as:  FIBERCON Take 625 mg by mouth at bedtime.   polyethylene glycol packet Commonly known as:  MIRALAX / GLYCOLAX Take 17 g by mouth daily.   ranitidine 150 MG tablet Commonly known as:  ZANTAC Take 150 mg by mouth at bedtime.   timolol 0.5 % ophthalmic solution Commonly known as:  BETIMOL Place 1 drop into both eyes at bedtime.   traMADol 50 MG tablet Commonly known as:  ULTRAM Take one tablet by mouth every 8 hours as needed for moderate to severe pain   Vitamin D3 1000 units Caps Take 1 capsule by mouth every  morning.       Review of Systems  Constitutional: Negative for activity change, appetite change, chills, diaphoresis, fatigue, fever and unexpected weight change.  HENT: Positive for hearing loss. Negative for congestion, drooling, ear discharge, ear pain, mouth sores, nosebleeds and postnasal drip.   Respiratory: Negative.   Cardiovascular: Negative.   Gastrointestinal: Negative.   Musculoskeletal: Negative.   Skin:       Some lesions in the hand which don't bother her.    Immunization History  Administered Date(s) Administered  . Influenza-Unspecified 10/05/2014  . PPD Test 02/14/2015  . Pneumococcal-Unspecified 12/30/1978   Pertinent  Health Maintenance Due  Topic Date Due  . INFLUENZA VACCINE  11/29/2016 (Originally 07/30/2016)  . DEXA SCAN  05/22/2017 (Originally 04/14/1979)  . PNA vac Low Risk Adult (1 of 2 - PCV13) 05/22/2017 (Originally 12/31/1979)   No flowsheet data found. Functional Status Survey:    Vitals:   10/03/16 0946  BP: 105/63  Pulse: 84  Resp: (!) 24  Temp: 97.7 F (36.5 C)  TempSrc: Oral  SpO2: 96%  Weight: 144 lb 6.4 oz (65.5 kg)  Height: 5\' 5"  (1.651 m)   Body mass index is 24.03 kg/m. Physical Exam  Constitutional: She appears well-developed and well-nourished.  HENT:  Head: Normocephalic.  Mouth/Throat: Oropharynx is clear and moist.  Cardiovascular: Normal rate, regular rhythm and normal heart sounds.   Pulmonary/Chest: Breath sounds normal. No respiratory distress. She has no wheezes. She has no rales.  Abdominal: Soft. Bowel sounds are normal. She exhibits no distension and no mass. There is no tenderness. There is no rebound and no guarding.  Musculoskeletal: She exhibits no edema or tenderness.  Neurological: She is alert.  Not oriented not sure due to Hearing loss or dementia.  Skin:  Patient has multiple lesions which look like Keratosis. Had chronic Venous changes in both legs.    Labs reviewed:  Recent Labs  03/05/16 0800  04/08/16 0600 Aug 28, 2016 1559  NA 135 137 132*  K 4.2 4.3 4.1  CL 96* 101 98*  CO2 30 30 29   GLUCOSE 103* 93 109*  BUN 20 20 21*  CREATININE 0.89 0.82 0.88  CALCIUM 9.6 9.1 9.0    Recent Labs  12/14/15 0815 03/05/16 0800 04/08/16 0600  AST 18 20 15   ALT 11* 17 13*  ALKPHOS 62 67 51  BILITOT 0.4 0.7 0.4  PROT 7.8 8.0 6.7  ALBUMIN 3.9 4.0 3.5    Recent Labs  12/14/15 0815 01/14/16 1609 03/05/16 0800 04/08/16 0600 August 28, 2016 1559  WBC 5.6 8.6 5.7 5.1 6.5  NEUTROABS 3.0 4.4 2.8  --   --   HGB  14.3 13.8 14.8 12.8 13.6  HCT 43.2 42.6 45.6 38.9 41.1  MCV 90.8 90.3 90.1 89.6 91.7  PLT 343 357 344 340 340   Lab Results  Component Value Date   TSH 1.676 08/29/2016   Lab Results  Component Value Date   HGBA1C 5.8 (H) 01/30/2015   Lab Results  Component Value Date   CHOL 105 01/30/2015   HDL 44 01/30/2015   LDLCALC 45 01/30/2015   TRIG 78 01/30/2015   CHOLHDL 2.4 01/30/2015    Significant Diagnostic Results in last 30 days:  No results found.  Assessment/Plan Essential hypertension  Patients BP has been running low with Systolic almost down to 98. Most of the systolic readings are in 100s. Will discontinue her Norvasc. Monitor BP everyday for 2 weeks. Then reevaluate.  Gastroesophageal reflux Patient is stable on Ranitidine.  Hypothyroidism Last TSH was Normal in 08/17. Repeat TSH in 6 months.   Keratosis Has follow up with Dermatologist as requested by Son.  Chronic Back pain due to Osteoporosis  Stable On Ultram PRN. Also is tolerating Neurontin. Also On calcitonin nasal Spray      Family/ staff Communication:   Labs/tests ordered:

## 2016-10-28 ENCOUNTER — Encounter: Payer: Self-pay | Admitting: Internal Medicine

## 2016-10-28 ENCOUNTER — Non-Acute Institutional Stay (SKILLED_NURSING_FACILITY): Payer: 59 | Admitting: Internal Medicine

## 2016-10-28 DIAGNOSIS — E871 Hypo-osmolality and hyponatremia: Secondary | ICD-10-CM | POA: Diagnosis not present

## 2016-10-28 DIAGNOSIS — L03115 Cellulitis of right lower limb: Secondary | ICD-10-CM

## 2016-10-28 NOTE — Progress Notes (Signed)
Location:    Nursing Home Room Number: 142/D Place of Service:  SNF (31) Provider:  Edmon CrapeArlo Elie Leppo  Mahlon GammonAnjali L Gupta, MD  Patient Care Team: Mahlon GammonAnjali L Gupta, MD as PCP - General (Internal Medicine)  Extended Emergency Contact Information Primary Emergency Contact: Manter,Nathan Address: [address]          [place], [place] Home Phone: [phone] Mobile Phone: [phone] Relation: Son  Code Status:  DNR Goals of care: Advanced Directive information Advanced Directives 10/28/2016  Does patient have an advance directive? Yes  Type of Advance Directive Out of facility DNR (pink MOST or yellow form)  Does patient want to make changes to advanced directive? No - Patient declined  Copy of advanced directive(s) in chart? Yes  Would patient like information on creating an advanced directive? -  Pre-existing out of facility DNR order (yellow form or pink MOST form) -     Chief Complaint  Patient presents with  . Acute Visit    Right Leg Infection    HPI:  Pt is a [age] y.o. female seen today for an acute visit for For erythema and open area on her right lower leg.  Apparently this has been noted by nursing staff and patient is complaining of some discomfort when the area is palpated and there is some surrounding erythema.  Patient does have a history of cellulitis of the right leg with a previous history of hematoma-this did resolve with antibiotics in the past.  She is not complaining of any fever or chills vital signs appear to be stable   Past Medical History:  Diagnosis Date  . Back pain   . Confusion   . DNR (do not resuscitate)   . GERD (gastroesophageal reflux disease)   . Glaucoma   . HOH (hard of hearing)   . Pelvic fracture (HCC)    snf  . Thyroid disease    Past Surgical History:  Procedure Laterality Date  . ABDOMINAL SURGERY    . CATARACT EXTRACTION    . HEMORRHOID SURGERY      Allergies  Allergen Reactions  .  Latex Itching  . Onion Nausea And Vomiting  . Other     SPANDEX ALLERGY  . Penicillins Other (See Comments)    UNKNOWN REACTION  . Sulfa Antibiotics Other (See Comments)    UNKNOWN REACTION      Medication List       Accurate as of 10/28/16  3:00 PM. Always use your most recent med list.          aspirin 81 MG EC tablet Take 1 tablet (81 mg total) by mouth daily.   calcitonin (salmon) 200 UNIT/ACT nasal spray Commonly known as:  MIACALCIN/FORTICAL Place 1 spray into alternate nostrils daily.   CALCIUM 600 + D PO Take 1 tablet by mouth every morning.   cetaphil cream Apply topical to legs lightly  twice a day   gabapentin 100 MG capsule Commonly known as:  NEURONTIN Take 1 capsule (100 mg total) by mouth 2 (two) times daily.   ibuprofen 200 MG tablet Commonly known as:  ADVIL,MOTRIN Take 200 mg by mouth daily.   ibuprofen 200 MG tablet Commonly known as:  ADVIL,MOTRIN Take 200 mg by mouth every 6 (six) hours as needed.   levothyroxine 75 MCG tablet Commonly known as:  SYNTHROID, LEVOTHROID Take 75 mcg by mouth daily before breakfast.   polycarbophil 625 MG tablet Commonly known as:  FIBERCON Take 625  mg by mouth at bedtime.   polyethylene glycol packet Commonly known as:  MIRALAX / GLYCOLAX Take 17 g by mouth daily.   ranitidine 150 MG tablet Commonly known as:  ZANTAC Take 150 mg by mouth at bedtime.   timolol 0.5 % ophthalmic solution Commonly known as:  BETIMOL Place 1 drop into both eyes at bedtime.   traMADol 50 MG tablet Commonly known as:  ULTRAM Take one tablet by mouth every 8 hours as needed for moderate to severe pain   Vitamin D3 1000 units Caps Take 1 capsule by mouth every morning.       Review of Systems   In general does not complaining any fever or chills.  Skin does not clear rashes or itching does have the sore right lower leg.  Does have numerous solar induced changes most prominently her face and arms has venous  stasis changes of her legs bilaterally.  Respirations 19 and shortness breath or cough.  Cardiac no chest pain has minimal lower extremity edema.  GI is not complaining of nausea vomiting diarrhea constipation or abdominal discomfort.  Muscle skeletal is not really complaining of leg pain but does complain of right lower leg pain when the area is palpated.  Neurologic is not complaining of dizziness or headache or syncope.  Psych continues to be in very good spirits pleasant and appropriate    Immunization History  Administered Date(s) Administered  . Influenza-Unspecified 10/05/2014, 09/27/2016  . PPD Test 02/14/2015  . Pneumococcal-Unspecified 12/30/1978, 10/10/2016   Pertinent  Health Maintenance Due  Topic Date Due  . DEXA SCAN  05/22/2017 (Originally 04/14/1979)  . PNA vac Low Risk Adult (2 of 2 - PCV13) 10/10/2017  . INFLUENZA VACCINE  Completed   No flowsheet data found. Functional Status Survey:    Vitals:   10/28/16 1459  BP: 121/72  Pulse: 84  Resp: 16  Temp: 97.8 F (36.6 C)  TempSrc: Oral   There is no height or weight on file to calculate BMI. Physical Exam   In general this is a somewhat frail elderly female in no distress sitting comfortably in her wheelchair.  Her skin is warm and dry again she does have numerous solar induced changes as noted above of her face and arms most prominently.  Has venous stasis changes of her lower legs bilaterally on her right lower leg there are a couple small open areas with erythematous wound bed there is some surrounding erythema and tenderness here-I do not note any bleeding.  Heart is regular rate and rhythm without murmur gallop or rub she has minimal lower extremity edema.  She does have a pedal pulse.  Abdomen is soft nontender with positive bowel sounds.  Musculoskeletal does move all extremities 4 ambulates largely in a wheelchair lower leg on the right skin issues as noted above.  Neurologic is grossly  intact she is alert.  Psych she is pleasant and appropriate is very hard of hearing which makes a cognitive assessment somewhat difficult but she is pleasant and appropriate  Labs reviewed:  Recent Labs  03/05/16 0800 04/08/16 0600 08/17/2016 1559  NA 135 137 132*  K 4.2 4.3 4.1  CL 96* 101 98*  CO2 30 30 29   GLUCOSE 103* 93 109*  BUN 20 20 21*  CREATININE 0.89 0.82 0.88  CALCIUM 9.6 9.1 9.0    Recent Labs  12/14/15 0815 03/05/16 0800 04/08/16 0600  AST 18 20 15   ALT 11* 17 13*  ALKPHOS 62 67 51  BILITOT 0.4 0.7 0.4  PROT 7.8 8.0 6.7  ALBUMIN 3.9 4.0 3.5    Recent Labs  12/14/15 0815 01/14/16 1609 03/05/16 0800 04/08/16 0600 22-Jul-2016 1559  WBC 5.6 8.6 5.7 5.1 6.5  NEUTROABS 3.0 4.4 2.8  --   --   HGB 14.3 13.8 14.8 12.8 13.6  HCT 43.2 42.6 45.6 38.9 41.1  MCV 90.8 90.3 90.1 89.6 91.7  PLT 343 357 344 340 340   Lab Results  Component Value Date   TSH 1.676 08/29/2016   Lab Results  Component Value Date   HGBA1C 5.8 (H) 01/30/2015   Lab Results  Component Value Date   CHOL 105 01/30/2015   HDL 44 01/30/2015   LDLCALC 45 01/30/2015   TRIG 78 01/30/2015   CHOLHDL 2.4 01/30/2015    Significant Diagnostic Results in last 30 days:  No results found.  Assessment/Plan  #1 suspected a right lower leg cellulitis-she does have  open areas that appear to have the beginnings of an infection with surrounding erythema and tenderness Will start doxycycline 100 mg twice a day for 7 days-also will start a probiotic twice a day for 10 days-  Also culture any possible drainage  Wound care also assessed the wound and will apply topical treatment as well  Number 2I do note per chart review she does have a history of mild hyponatremia at times with most recent sodium was 132 on lab done on August-will update this tomorrow also will update a CBC to take a look at her white count  [id number]      London SheerLuster, Sally C, CMA [phone]

## 2016-10-29 ENCOUNTER — Encounter (HOSPITAL_COMMUNITY)
Admission: RE | Admit: 2016-10-29 | Discharge: 2016-10-29 | Disposition: A | Discharge status: Home / Self Care | Payer: 59 | Source: Skilled Nursing Facility | Attending: Internal Medicine | Admitting: Internal Medicine

## 2016-10-29 DIAGNOSIS — I639 Cerebral infarction, unspecified: Secondary | ICD-10-CM | POA: Insufficient documentation

## 2016-10-29 DIAGNOSIS — M810 Age-related osteoporosis without current pathological fracture: Secondary | ICD-10-CM | POA: Diagnosis not present

## 2016-10-29 DIAGNOSIS — K219 Gastro-esophageal reflux disease without esophagitis: Secondary | ICD-10-CM | POA: Insufficient documentation

## 2016-10-29 DIAGNOSIS — E039 Hypothyroidism, unspecified: Secondary | ICD-10-CM | POA: Diagnosis not present

## 2016-10-29 DIAGNOSIS — H409 Unspecified glaucoma: Secondary | ICD-10-CM | POA: Insufficient documentation

## 2016-10-29 LAB — BASIC METABOLIC PANEL
Anion gap: 6 (ref 5–15)
BUN: 18 mg/dL (ref 6–20)
CALCIUM: 9 mg/dL (ref 8.9–10.3)
CO2: 29 mmol/L (ref 22–32)
CREATININE: 0.86 mg/dL (ref 0.44–1.00)
Chloride: 97 mmol/L — ABNORMAL LOW (ref 101–111)
GFR calc non Af Amer: 53 mL/min — ABNORMAL LOW (ref >60)
Glucose, Bld: 93 mg/dL (ref 65–99)
Potassium: 4.1 mmol/L (ref 3.5–5.1)
SODIUM: 132 mmol/L — AB (ref 135–145)

## 2016-10-29 LAB — CBC WITH DIFFERENTIAL/PLATELET
BASOS ABS: 0.1 10*3/uL (ref 0.0–0.1)
BASOS PCT: 1 %
EOS ABS: 0.2 10*3/uL (ref 0.0–0.7)
Eosinophils Relative: 4 %
HCT: 38.9 % (ref 36.0–46.0)
HEMOGLOBIN: 13.2 g/dL (ref 12.0–15.0)
Lymphocytes Relative: 25 %
Lymphs Abs: 1.4 10*3/uL (ref 0.7–4.0)
MCH: 30.5 pg (ref 26.0–34.0)
MCHC: 33.9 g/dL (ref 30.0–36.0)
MCV: 89.8 fL (ref 78.0–100.0)
MONO ABS: 0.7 10*3/uL (ref 0.1–1.0)
MONOS PCT: 13 %
NEUTROS PCT: 57 %
Neutro Abs: 3.2 10*3/uL (ref 1.7–7.7)
Platelets: 342 10*3/uL (ref 150–400)
RBC: 4.33 MIL/uL (ref 3.87–5.11)
RDW: 13.5 % (ref 11.5–15.5)
WBC: 5.6 10*3/uL (ref 4.0–10.5)

## 2016-10-31 ENCOUNTER — Non-Acute Institutional Stay (SKILLED_NURSING_FACILITY): Payer: 59 | Admitting: Internal Medicine

## 2016-10-31 ENCOUNTER — Encounter: Payer: Self-pay | Admitting: Internal Medicine

## 2016-10-31 DIAGNOSIS — I10 Essential (primary) hypertension: Secondary | ICD-10-CM

## 2016-10-31 DIAGNOSIS — M5489 Other dorsalgia: Secondary | ICD-10-CM

## 2016-10-31 DIAGNOSIS — I639 Cerebral infarction, unspecified: Secondary | ICD-10-CM

## 2016-10-31 DIAGNOSIS — M810 Age-related osteoporosis without current pathological fracture: Secondary | ICD-10-CM

## 2016-10-31 NOTE — Progress Notes (Signed)
Location:   [facility] Room Number: 142/D Place of Service:  SNF (31) Provider:  Alexis Frock, MD  Patient Care Team: Mahlon Gammon, MD as PCP - General (Internal Medicine)  Extended Emergency Contact Information Primary Emergency Contact: Wingate,Nathan Address: [address]          [place], [place] Home Phone: [phone] Mobile Phone: [phone] Relation: Son  Code Status:  DNR Goals of care: Advanced Directive information Advanced Directives 10/31/2016  Does patient have an advance directive? Yes  Type of Advance Directive Out of facility DNR (pink MOST or yellow form)  Does patient want to make changes to advanced directive? No - Patient declined  Copy of advanced directive(s) in chart? Yes  Would patient like information on creating an advanced directive? -  Pre-existing out of facility DNR order (yellow form or pink MOST form) -     Chief Complaint  Patient presents with  . Medical Management of Chronic Issues    Routine Visit   Medical management of chronic medical conditions including dementia-GERD-hypothyroidism-chronic back pain-history of cellulitis HPI:  Pt is a 82 y.o. female seen today for medical management of chronic diseases.   She has been quite stable her weight has been stable nursing staff does not report any acute issues other than  cellulitis of her lower legs- actually is completing a course of doxycycline for right lower leg cellulitis-this is complicated somewhat with her history venous stasis changes as well  Erythema does appear to be improved today she says it is less tender as well.  Regards to other medical issues she appears to be stable.  She has no complaints today continues to be bright alert talkative although this is complicated somewhat with her hearing difficulties   Past Medical History:  Diagnosis Date  . Back pain   . Confusion   . DNR (do not  resuscitate)   . GERD (gastroesophageal reflux disease)   . Glaucoma   . HOH (hard of hearing)   . Pelvic fracture (HCC)    snf  . Thyroid disease    Past Surgical History:  Procedure Laterality Date  . ABDOMINAL SURGERY    . CATARACT EXTRACTION    . HEMORRHOID SURGERY      Allergies  Allergen Reactions  . Latex Itching  . Onion Nausea And Vomiting  . Other     SPANDEX ALLERGY  . Penicillins Other (See Comments)    UNKNOWN REACTION  . Sulfa Antibiotics Other (See Comments)    UNKNOWN REACTION      Medication List       Accurate as of 10/31/16  3:42 PM. Always use your most recent med list.          aspirin 81 MG EC tablet Take 1 tablet (81 mg total) by mouth daily.   calcitonin (salmon) 200 UNIT/ACT nasal spray Commonly known as:  MIACALCIN/FORTICAL Place 1 spray into alternate nostrils daily.   CALCIUM 600 + D PO Take 1 tablet by mouth every morning.   cetaphil cream Apply topical to legs lightly  twice a day   collagenase ointment Commonly known as:  SANTYL Apply 1 application topically daily.   doxycycline 100 MG capsule Commonly known as:  VIBRAMYCIN Take 100 mg by mouth 2 (two) times daily. For 7 days   gabapentin 100 MG capsule Commonly known as:  NEURONTIN Take 1 capsule (100 mg total) by mouth 2 (two) times  daily.   ibuprofen 200 MG tablet Commonly known as:  ADVIL,MOTRIN Take 200 mg by mouth daily.   ibuprofen 200 MG tablet Commonly known as:  ADVIL,MOTRIN Take 200 mg by mouth every 6 (six) hours as needed.   levothyroxine 75 MCG tablet Commonly known as:  SYNTHROID, LEVOTHROID Take 75 mcg by mouth daily before breakfast.   polycarbophil 625 MG tablet Commonly known as:  FIBERCON Take 625 mg by mouth at bedtime.   polyethylene glycol packet Commonly known as:  MIRALAX / GLYCOLAX Take 17 g by mouth daily.   ranitidine 150 MG tablet Commonly known as:  ZANTAC Take 150 mg by mouth at bedtime.   RISA-BID PROBIOTIC Tabs Take 1  tablet by mouth twice a day for 10 days   timolol 0.5 % ophthalmic solution Commonly known as:  BETIMOL Place 1 drop into both eyes at bedtime.   traMADol 50 MG tablet Commonly known as:  ULTRAM Take one tablet by mouth every 8 hours as needed for moderate to severe pain   Vitamin D3 1000 units Caps Take 1 capsule by mouth every morning.       Review of Systems   general she is not complaining of any fever or chills.  SKIN does not complain of rashes or itching she does have baseline solar induced changes in a history of right leg cellulitis at times this appears to be improving today she is completing a course of doxycycline  Head ears eyes nose mouth and throat does not complain of any visual changes sore throat.  Respiratory does not complaining of any shortness breath or cough.  Cardiac no chest pain continue with some mild lower extremity edema.  GI is not complaining of abdominal pain nausea vomiting diarrhea constipation or dysphagia.  Muscle skeletal not complaining of joint or back pain at this time again the Avapro for an appears to be helping.  Neurologic complaining of dizziness headache or syncopal-type feelings.  Psychiatric does not complain of depression or anxiety    Immunization History  Administered Date(s) Administered  . Influenza-Unspecified 10/05/2014, 09/27/2016  . PPD Test 02/14/2015  . Pneumococcal-Unspecified 12/30/1978, 10/10/2016   Pertinent  Health Maintenance Due  Topic Date Due  . DEXA SCAN  05/22/2017 (Originally 04/14/1979)  . PNA vac Low Risk Adult (2 of 2 - PCV13) 10/10/2017  . INFLUENZA VACCINE  Completed   No flowsheet data found. Functional Status Survey:    Vitals:   10/31/16 1530  BP: 116/71  Pulse: 81  Resp: (!) 24  Temp: 97.1 F (36.2 C)  TempSrc: Oral  SpO2: 96%  Weight is stable at 144.4 pounds There is no height or weight on file to calculate BMI. Physical Exam General is a pleasant elderly female  in no distress sitting comfortably in a wheelchair.  Her skin is warm and dry she does have solar induced changes on her arms and face continues with venous stasis changes of her lower legs bilaterally. Erythema on her right leg appears to be reduced from previous exam and there is less tenderness  Eyes pupils appear reactive light sclerae and neck are clear visual acuity appears grossly intact.  Oropharynx clear mucous membranes moist.  Chest is clear to auscultation somewhat shallow entry there is no labored breathing.  Abdomen is soft nontender with positive bowel sounds.  Muscle skeletal is able to move all extremities 4 largely of the brain wheelchair I do not note arthritic changes diffuse but no acute changes again she does walk  with a walker at times with restorative.  Neurologic grossly intact her speech is clear no lateralizing findings.  Psych she is alert and grossly oriented conversational a very pleasant individual apparently she does have periods of confusion-somewhat difficult to fully tell level of confusion because she is so hard of hearing.   Labs reviewed:  Recent Labs  04/08/16 0600 08/23/2016 1559 10/29/16 0500  NA 137 132* 132*  K 4.3 4.1 4.1  CL 101 98* 97*  CO2 30 29 29   GLUCOSE 93 109* 93  BUN 20 21* 18  CREATININE 0.82 0.88 0.86  CALCIUM 9.1 9.0 9.0    Recent Labs  12/14/15 0815 03/05/16 0800 04/08/16 0600  AST 18 20 15   ALT 11* 17 13*  ALKPHOS 62 67 51  BILITOT 0.4 0.7 0.4  PROT 7.8 8.0 6.7  ALBUMIN 3.9 4.0 3.5    Recent Labs  01/14/16 1609 03/05/16 0800 04/08/16 0600 08/26/2016 1559 10/29/16 0500  WBC 8.6 5.7 5.1 6.5 5.6  NEUTROABS 4.4 2.8  --   --  3.2  HGB 13.8 14.8 12.8 13.6 13.2  HCT 42.6 45.6 38.9 41.1 38.9  MCV 90.3 90.1 89.6 91.7 89.8  PLT 357 344 340 340 342   Lab Results  Component Value Date   TSH 1.676 08/29/2016   Lab Results  Component Value Date   HGBA1C 5.8 (H) 01/30/2015   Lab Results    Component Value Date   CHOL 105 01/30/2015   HDL 44 01/30/2015   LDLCALC 45 01/30/2015   TRIG 78 01/30/2015   CHOLHDL 2.4 01/30/2015    Significant Diagnostic Results in last 30 days:  No results found.  Assessment/Plan  #1 history of hypertension this appears stable  Recent blood pressures 116/71-127/57-128/65-I see one listed as 90/64 but this appears to be quite rare one would suspect possibly a machine variation.  #2 history of CVA she continues on aspirin she has very minimal residual deficit here actually has walked again with restorative considering her advanced age she is actually doing quite remarkably well.  #3 history hypothyroidism continues on Synthroid TSH in February was within normal limits at 3.935.  #4 history of suspected GERD this appears stable on ranitidine-she did go to the hospital several months ago for chest pain thought to be noncardiac-she was switched from a PPI secondary to her pharmacy recommendation at this point will monitor at this point the ranitidine appears to be effective.  #5 history of pain mainly back pain with history of compression fractures ibuprofen appears to be giving adequate relief at this point. Along with the Neurontin  She does have a history of osteoporosis continues on calcium supplementation.  I also noted a previous history of a pelvic fracture.  History of anemia-I suspect chronic disease hemoglobin actually stable at 13.2 on lab done on 10/29/2016   #7 history of diastolic CHF grade 1-this is stable weights are stable edema appears to be fairly minimal and baseline  no complaints of shortness of breath or chest pain at this point will monitor.  Number 8 history hypothyroidism TSH done most recently was within normal limits plans to recheck this in 5 months.  #9-history of right lower extremity cellulitis-again this appears to be resolving unremarkably she is completing a course of doxycycline there is less erythema  and essentially no tenderness today   [phone]

## 2016-11-06 ENCOUNTER — Non-Acute Institutional Stay (SKILLED_NURSING_FACILITY): Payer: 59 | Admitting: Internal Medicine

## 2016-11-06 ENCOUNTER — Encounter: Payer: Self-pay | Admitting: Internal Medicine

## 2016-11-06 DIAGNOSIS — R4182 Altered mental status, unspecified: Secondary | ICD-10-CM | POA: Diagnosis not present

## 2016-11-06 NOTE — Progress Notes (Signed)
Location:   [facility] Room Number: 142/D Place of Service:  SNF [phone](31) Provider:  Edmon CrapeArlo Andruw Battie  Mahlon GammonAnjali L Gupta, MD  Patient Care Team: Mahlon GammonAnjali L Gupta, MD as PCP - General (Internal Medicine)  Extended Emergency Contact Information Primary Emergency Contact: Koppel,Nathan Address: [address]          [place], [place] Home Phone: [phone] Mobile Phone: [phone] Relation: Son  Code Status:  DNR Goals of care: Advanced Directive information Advanced Directives 11/06/2016  Does patient have an advance directive? Yes  Type of Advance Directive Out of facility DNR (pink MOST or yellow form)  Does patient want to make changes to advanced directive? No - Patient declined  Copy of advanced directive(s) in chart? Yes  Would patient like information on creating an advanced directive? -  Pre-existing out of facility DNR order (yellow form or pink MOST form) -     Chief Complaint  Patient presents with  . Acute Visit  Secondary to unresponsive episode-altered mental status  HPI:  Pt is a [age] y.o. female seen today for an acute visit for follow-up of altered mental status with unresponsiveness.  Patient apparently was in her usual state of health right alert pleasant when apparently she was found to be unresponsive.  After a short period She did actually did start moving some extremities opening her eyes in fact when she heard her son's voice she actually opened her eyes and smiled--since then she is kind of waxed and waned becoming a bit more responsive and then becoming somewhat unresponsive although she is able to grip ones hands an attempt to smile and does open her eyes at times.  She had a similar episode back in August and was sent to the ER where workup was essentially unremarkable she did have an EKG which showed normal sinus rhythm lab work was fairly unremarkable she was thought possibly to have a UTI and completed  antibiotic.  Her vital signs this time continued to be stable her blood sugars stable O2 saturation is in the 90s on room air she does not appear to be in any discomfort-we did order an EKG which again showed normal sinus rhythm.  Currently she is resting in bed comfortably waxing and waning in terms of alertness but she is able to grip bilaterally does open her eyes at times and attempt to smile will try to follow verbal commands at times.      Past Medical History:  Diagnosis Date  . Back pain   . Confusion   . DNR (do not resuscitate)   . GERD (gastroesophageal reflux disease)   . Glaucoma   . HOH (hard of hearing)   . Pelvic fracture (HCC)    snf  . Thyroid disease    Past Surgical History:  Procedure Laterality Date  . ABDOMINAL SURGERY    . CATARACT EXTRACTION    . HEMORRHOID SURGERY      Allergies  Allergen Reactions  . Latex Itching  . Onion Nausea And Vomiting  . Other     SPANDEX ALLERGY  . Penicillins Other (See Comments)    UNKNOWN REACTION  . Sulfa Antibiotics Other (See Comments)    UNKNOWN REACTION      Medication List       Accurate as of 11/06/16  2:21 PM. Always use your most recent med list.          aspirin 81 MG EC tablet Take  1 tablet (81 mg total) by mouth daily.   calcitonin (salmon) 200 UNIT/ACT nasal spray Commonly known as:  MIACALCIN/FORTICAL Place 1 spray into alternate nostrils daily.   CALCIUM 600 + D PO Take 1 tablet by mouth every morning.   cetaphil cream Apply topical to legs lightly  twice a day   collagenase ointment Commonly known as:  SANTYL Apply 1 application topically daily.   gabapentin 100 MG capsule Commonly known as:  NEURONTIN Take 1 capsule (100 mg total) by mouth 2 (two) times daily.   ibuprofen 200 MG tablet Commonly known as:  ADVIL,MOTRIN Take 200 mg by mouth daily.   ibuprofen 200 MG tablet Commonly known as:  ADVIL,MOTRIN Take 200 mg by mouth every 6 (six) hours as needed.     levothyroxine 75 MCG tablet Commonly known as:  SYNTHROID, LEVOTHROID Take 75 mcg by mouth daily before breakfast.   polycarbophil 625 MG tablet Commonly known as:  FIBERCON Take 625 mg by mouth at bedtime.   polyethylene glycol packet Commonly known as:  MIRALAX / GLYCOLAX Take 17 g by mouth daily.   ranitidine 150 MG tablet Commonly known as:  ZANTAC Take 150 mg by mouth at bedtime.   RISA-BID PROBIOTIC Tabs Take 1 tablet by mouth twice a day for 10 days   timolol 0.5 % ophthalmic solution Commonly known as:  BETIMOL Place 1 drop into both eyes at bedtime.   traMADol 50 MG tablet Commonly known as:  ULTRAM Take one tablet by mouth every 8 hours as needed for moderate to severe pain   Vitamin D3 1000 units Caps Take 1 capsule by mouth every morning.       Review of Systems   Essentially unattainable please see history of present illness  Immunization History  Administered Date(s) Administered  . Influenza-Unspecified 10/05/2014, 09/27/2016  . PPD Test 02/14/2015  . Pneumococcal-Unspecified 12/30/1978, 10/10/2016   Pertinent  Health Maintenance Due  Topic Date Due  . DEXA SCAN  05/22/2017 (Originally 04/14/1979)  . PNA vac Low Risk Adult (2 of 2 - PCV13) 10/10/2017  . INFLUENZA VACCINE  Completed   No flowsheet data found. Functional Status Survey:    Vitals:   11/06/16 1407  BP: 128/60  Pulse: 80  Resp: 18  SpO2: 92%   There is no height or weight on file to calculate BMI. Physical Exam   In general this is a frail elderly female initially quite lethargic with some prompting was able to grip my fingers bilaterally and open her eyes and even attempted to smile.  Again her responsiveness waxed and waned during serial exams.  Her skin is warm and dry she is not diaphoretic she has numerous solar induced changes.  Eyes pupils appear equal round reactive to light sclera and conjunctiva are clear she appears able to make some eye contact.  Oropharynx  clear mucous membranes appear fairly moist.  Chest has shallow air entry but I cannot really appreciate overt congestion there is no labored breathing.  Abdomen is soft does not appear to be tender there are positive bowel sounds.  Muscle skeletal does appear able to move all extremities although quite weak especially lower extremities-she does have wrapping over her right lower leg she does have some history of venous stasis changes and recent possible cellulitis which she has completed antibiotic.  Neurologic again limited exam since she has limited responsiveness but I could not really appreciate any true lateralizing findings cranial nerves appear to be grossly intact she is  able to smile.  Psych again quite lethargic so difficult do a full exam but appears able to smile appears to recognize [place] although very weak.     Labs reviewed:  Recent Labs  04/08/16 0600 Sep 07, 2016 1559 10/29/16 0500  NA 137 132* 132*  K 4.3 4.1 4.1  CL 101 98* 97*  CO2 30 29 29   GLUCOSE 93 109* 93  BUN 20 21* 18  CREATININE 0.82 0.88 0.86  CALCIUM 9.1 9.0 9.0    Recent Labs  12/14/15 0815 03/05/16 0800 04/08/16 0600  AST 18 20 15   ALT 11* 17 13*  ALKPHOS 62 67 51  BILITOT 0.4 0.7 0.4  PROT 7.8 8.0 6.7  ALBUMIN 3.9 4.0 3.5    Recent Labs  01/14/16 1609 03/05/16 0800 04/08/16 0600 September 07, 2016 1559 10/29/16 0500  WBC 8.6 5.7 5.1 6.5 5.6  NEUTROABS 4.4 2.8  --   --  3.2  HGB 13.8 14.8 12.8 13.6 13.2  HCT 42.6 45.6 38.9 41.1 38.9  MCV 90.3 90.1 89.6 91.7 89.8  PLT 357 344 340 340 342   Lab Results  Component Value Date   TSH 1.676 08/29/2016   Lab Results  Component Value Date   HGBA1C 5.8 (H) 01/30/2015   Lab Results  Component Value Date   CHOL 105 01/30/2015   HDL 44 01/30/2015   LDLCALC 45 01/30/2015   TRIG 78 01/30/2015   CHOLHDL 2.4 01/30/2015    Significant Diagnostic Results in last 30 days:  No results found.  Assessment/Plan  #1-altered mental status  unresponsiveness-again patient appears to wax and wane in regards to this although appears very weak-I did discuss options with her son at bedside-he really would prefer she not go to the hospital --I did discuss the fact there was difficulty to say if this was a neurologic process or infectious process seen expressed understanding-he really wants as few invasive interventions as possible.  We will order lab work including a CBC CMP and TSH-also will order a chest x-ray 2 view.  Also will try to obtain a urine although her son does not really want her catheterized for this so this may be difficult to obtain in a timely manner.  Her son is aware of her status-and appears to essentially want her kept comfortable-and hospice consult that if she does not improve.  I did reassess her later in the evening and she appeared to be significantly improved she was talking alert-says she still felt weak but was talkative-at her baseline mental status as she was telling me about some surgery she had 40 years ago involving her navel.  Neurologically appear to be intact heart was regular rate and rhythm chest sounds were clear-at this point continue to monitor      [id number]-of note greater than 40 minutes spent assessing patient reassessing patient-assessing her status with nursing staff as well as with her son at bedside-reassessing patient multiple times-andcoordinating and formulating plan of care      London Sheer, [place] [phone]

## 2016-11-07 ENCOUNTER — Encounter: Payer: Self-pay | Admitting: Internal Medicine

## 2016-11-07 ENCOUNTER — Non-Acute Institutional Stay (SKILLED_NURSING_FACILITY): Payer: 59 | Admitting: Internal Medicine

## 2016-11-07 ENCOUNTER — Encounter (HOSPITAL_COMMUNITY)
Admission: RE | Admit: 2016-11-07 | Discharge: 2016-11-07 | Disposition: A | Discharge status: Home / Self Care | Payer: 59 | Source: Skilled Nursing Facility | Attending: Internal Medicine | Admitting: Internal Medicine

## 2016-11-07 DIAGNOSIS — R4189 Other symptoms and signs involving cognitive functions and awareness: Secondary | ICD-10-CM

## 2016-11-07 DIAGNOSIS — K219 Gastro-esophageal reflux disease without esophagitis: Secondary | ICD-10-CM | POA: Diagnosis not present

## 2016-11-07 DIAGNOSIS — I639 Cerebral infarction, unspecified: Secondary | ICD-10-CM | POA: Diagnosis present

## 2016-11-07 DIAGNOSIS — E039 Hypothyroidism, unspecified: Secondary | ICD-10-CM | POA: Insufficient documentation

## 2016-11-07 DIAGNOSIS — H409 Unspecified glaucoma: Secondary | ICD-10-CM | POA: Diagnosis not present

## 2016-11-07 DIAGNOSIS — M810 Age-related osteoporosis without current pathological fracture: Secondary | ICD-10-CM | POA: Insufficient documentation

## 2016-11-07 LAB — COMPREHENSIVE METABOLIC PANEL
ALBUMIN: 3.6 g/dL (ref 3.5–5.0)
ALT: 14 U/L (ref 14–54)
ANION GAP: 10 (ref 5–15)
AST: 23 U/L (ref 15–41)
Alkaline Phosphatase: 59 U/L (ref 38–126)
BILIRUBIN TOTAL: 0.6 mg/dL (ref 0.3–1.2)
BUN: 17 mg/dL (ref 6–20)
CO2: 24 mmol/L (ref 22–32)
Calcium: 9.7 mg/dL (ref 8.9–10.3)
Chloride: 101 mmol/L (ref 101–111)
Creatinine, Ser: 0.94 mg/dL (ref 0.44–1.00)
GFR calc Af Amer: 55 mL/min — ABNORMAL LOW (ref >60)
GFR, EST NON AFRICAN AMERICAN: 47 mL/min — AB (ref >60)
Glucose, Bld: 121 mg/dL — ABNORMAL HIGH (ref 65–99)
POTASSIUM: 4.7 mmol/L (ref 3.5–5.1)
SODIUM: 135 mmol/L (ref 135–145)
TOTAL PROTEIN: 7.3 g/dL (ref 6.5–8.1)

## 2016-11-07 LAB — CBC WITH DIFFERENTIAL/PLATELET
BASOS ABS: 0.1 10*3/uL (ref 0.0–0.1)
BASOS PCT: 2 %
EOS ABS: 0.3 10*3/uL (ref 0.0–0.7)
Eosinophils Relative: 7 %
HEMATOCRIT: 44.3 % (ref 36.0–46.0)
HEMOGLOBIN: 14.8 g/dL (ref 12.0–15.0)
Lymphocytes Relative: 29 %
Lymphs Abs: 1.4 10*3/uL (ref 0.7–4.0)
MCH: 30 pg (ref 26.0–34.0)
MCHC: 33.4 g/dL (ref 30.0–36.0)
MCV: 89.9 fL (ref 78.0–100.0)
MONOS PCT: 8 %
Monocytes Absolute: 0.4 10*3/uL (ref 0.1–1.0)
NEUTROS ABS: 2.7 10*3/uL (ref 1.7–7.7)
NEUTROS PCT: 54 %
Platelets: 224 10*3/uL (ref 150–400)
RBC: 4.93 MIL/uL (ref 3.87–5.11)
RDW: 13.9 % (ref 11.5–15.5)
WBC: 4.9 10*3/uL (ref 4.0–10.5)

## 2016-11-07 LAB — TSH: TSH: 2.607 u[IU]/mL (ref 0.350–4.500)

## 2016-11-07 NOTE — Progress Notes (Signed)
Location:    [facility] Room Number: 142/D Place of Service:  SNF [phone]) Provider:  Edmon Crape  Mahlon Gammon, MD  Patient Care Team: Mahlon Gammon, MD as PCP - General (Internal Medicine)  Extended Emergency Contact Information Primary Emergency Contact: Caruth,Nathan Address: [address]          [place], [place] Home Phone: [phone] Mobile Phone: [phone] Relation: Son  Code Status:  DNR Goals of care: Advanced Directive information Advanced Directives 11/07/2016  Does patient have an advance directive? Yes  Type of Advance Directive Out of facility DNR (pink MOST or yellow form)  Does patient want to make changes to advanced directive? No - Patient declined  Copy of advanced directive(s) in chart? Yes  Would patient like information on creating an advanced directive? -  Pre-existing out of facility DNR order (yellow form or pink MOST form) -     Chief Complaint  Patient presents with  . Acute Visit    F/U   Altered mental status unresponsive episode yesterday HPI:  Pt is a 82 y.o. female seen today for an acute visit for follow-up of episode of unresponsiveness and altered mental status yesterday.  Patient did gradually become more responsive and actually by the time I saw her last night she was close to her baseline.  We did obtain lab work which  I have reviewed and appear unremarkable-chest x-ray also was negative for any acute process her vital signs continued to be stable she is back up alert in her wheelchair today      Past Medical History:  Diagnosis Date  . Back pain   . Confusion   . DNR (do not resuscitate)   . GERD (gastroesophageal reflux disease)   . Glaucoma   . HOH (hard of hearing)   . Pelvic fracture (HCC)    snf  . Thyroid disease    Past Surgical History:  Procedure Laterality Date  . ABDOMINAL SURGERY    . CATARACT EXTRACTION    . HEMORRHOID SURGERY       Allergies  Allergen Reactions  . Latex Itching  . Onion Nausea And Vomiting  . Other     SPANDEX ALLERGY  . Penicillins Other (See Comments)    UNKNOWN REACTION  . Sulfa Antibiotics Other (See Comments)    UNKNOWN REACTION      Medication List       Accurate as of 11/07/16 12:48 PM. Always use your most recent med list.          aspirin 81 MG EC tablet Take 1 tablet (81 mg total) by mouth daily.   calcitonin (salmon) 200 UNIT/ACT nasal spray Commonly known as:  MIACALCIN/FORTICAL Place 1 spray into alternate nostrils daily.   CALCIUM 600 + D PO Take 1 tablet by mouth every morning.   cetaphil cream Apply topical to legs lightly  twice a day   collagenase ointment Commonly known as:  SANTYL Apply 1 application topically daily.   gabapentin 100 MG capsule Commonly known as:  NEURONTIN Take 1 capsule (100 mg total) by mouth 2 (two) times daily.   ibuprofen 200 MG tablet Commonly known as:  ADVIL,MOTRIN Take 200 mg by mouth daily.   ibuprofen 200 MG tablet Commonly known as:  ADVIL,MOTRIN Take 200 mg by mouth every 6 (six) hours as needed.   levothyroxine 75 MCG tablet Commonly known as:  SYNTHROID, LEVOTHROID Take 75 mcg by mouth daily  before breakfast.   polycarbophil 625 MG tablet Commonly known as:  FIBERCON Take 625 mg by mouth at bedtime.   polyethylene glycol packet Commonly known as:  MIRALAX / GLYCOLAX Take 17 g by mouth daily.   ranitidine 150 MG tablet Commonly known as:  ZANTAC Take 150 mg by mouth at bedtime.   RISA-BID PROBIOTIC Tabs Take 1 tablet by mouth twice a day for 10 days   timolol 0.5 % ophthalmic solution Commonly known as:  BETIMOL Place 1 drop into both eyes at bedtime.   traMADol 50 MG tablet Commonly known as:  ULTRAM Take one tablet by mouth every 8 hours as needed for moderate to severe pain   Vitamin D3 1000 units Caps Take 1 capsule by mouth every morning.       Review of Systems  This is somewhat  limited since patient is very hard of hearing.  She says she feels a bit weak but otherwise does not complain of any pain shortness of breath dizziness or headache.  General no complaints of fever chills.  It is eyes nose mouth and throat no complaints of visual changes or sore throat.  Respiratory no shortness breath or cough again chest x-ray was negative.  Cardiac does not complaining of any chest pain has some venous stasis changes lower extremities which is baseline.  GI is not complaining of any abdominal pain nausea vomiting diarrhea constipation.  GU does not complain of dysuria area  Muscle skeletal is not complaining of joint pain just says she feels somewhat weak.  Neurologic no dizziness headache or syncopal complaints.  Psych continues to be pleasant appropriate but very hard of hearing  Immunization History  Administered Date(s) Administered  . Influenza-Unspecified 10/05/2014, 09/27/2016  . PPD Test 02/14/2015  . Pneumococcal-Unspecified 12/30/1978, 10/10/2016   Pertinent  Health Maintenance Due  Topic Date Due  . DEXA SCAN  05/22/2017 (Originally 04/14/1979)  . PNA vac Low Risk Adult (2 of 2 - PCV13) 10/10/2017  . INFLUENZA VACCINE  Completed   No flowsheet data found. Functional Status Survey:    Vitals:   11/07/16 1244  BP: 121/70  Pulse: 78  Resp: 18  Temp: 97.7 F (36.5 C)  TempSrc: Oral    Physical Exam  General is a pleasant elderly female in no distress sitting comfortably in a wheelchair. She is bright alert smiling  Her skin is warm and dry she does have solar induced changes on her arms and face continues with venous stasis changes of her lower legs bilaterally.  lower right leg is wrapped In Kerlex apparently for protection per nursing staff this appears stable with no sign of cellulitis  Eyes pupils appear reactive light sclerae and neck are clear visual acuity appears grossly intact.  Oropharynx clear mucous membranes  moist.  Chest is clear to auscultation somewhat shallow entry there is no labored breathing.  Abdomen is soft nontender with positive bowel sounds.  Muscle skeletal is able to move all extremities 4 --Gen. frailty with arthritic changes    Neurologic grossly intact her speech is clear no lateralizing findings.  Psych she is alert and grossly oriented conversational a very pleasant individua Hard t of hearing but very pleasant   Labs reviewed:  Recent Labs  08/23/2016 1559 10/29/16 0500 11/07/16 0900  NA 132* 132* 135  K 4.1 4.1 4.7  CL 98* 97* 101  CO2 29 29 24   GLUCOSE 109* 93 121*  BUN 21* 18 17  CREATININE 0.88 0.86 0.94  CALCIUM 9.0 9.0 9.7    Recent Labs  03/05/16 0800 04/08/16 0600 11/07/16 0900  AST 20 15 23   ALT 17 13* 14  ALKPHOS 67 51 59  BILITOT 0.7 0.4 0.6  PROT 8.0 6.7 7.3  ALBUMIN 4.0 3.5 3.6    Recent Labs  03/05/16 0800  08/04/2016 1559 10/29/16 0500 11/07/16 0900  WBC 5.7  < > 6.5 5.6 4.9  NEUTROABS 2.8  --   --  3.2 2.7  HGB 14.8  < > 13.6 13.2 14.8  HCT 45.6  < > 41.1 38.9 44.3  MCV 90.1  < > 91.7 89.8 89.9  PLT 344  < > 340 342 224  < > = values in this interval not displayed. Lab Results  Component Value Date   TSH 2.607 11/07/2016   Lab Results  Component Value Date   HGBA1C 5.8 (H) 01/30/2015   Lab Results  Component Value Date   CHOL 105 01/30/2015   HDL 44 01/30/2015   LDLCALC 45 01/30/2015   TRIG 78 01/30/2015   CHOLHDL 2.4 01/30/2015    Significant Diagnostic Results in last 30 days:  No results found.  Assessment/Plan  Status post unresponsiveness altered mental status-Tina Lane is back at her baseline today-again lab work was unremarkable--EKG showed normal sinus rhythm-- chest x-ray was negative we did order a urinalysis and culture although this has been difficult to obtain since her son does not want her catheterized-at this point will monitor but she appears to be again back to  normal  [id number]         London SheerLuster, Sally C, [place] [phone]

## 2016-11-11 ENCOUNTER — Other Ambulatory Visit: Payer: Self-pay | Admitting: *Deleted

## 2016-11-11 MED ORDER — TRAMADOL HCL 50 MG PO TABS
ORAL_TABLET | ORAL | 0 refills | Status: DC
Start: 2016-11-11 — End: 2017-09-22

## 2016-11-11 NOTE — Telephone Encounter (Signed)
[facility] #[phone] Fax: [phone]   

## 2016-12-02 ENCOUNTER — Non-Acute Institutional Stay (SKILLED_NURSING_FACILITY): Payer: 59 | Admitting: Internal Medicine

## 2016-12-02 ENCOUNTER — Encounter: Payer: Self-pay | Admitting: Internal Medicine

## 2016-12-02 DIAGNOSIS — R404 Transient alteration of awareness: Secondary | ICD-10-CM

## 2016-12-02 DIAGNOSIS — M810 Age-related osteoporosis without current pathological fracture: Secondary | ICD-10-CM

## 2016-12-02 DIAGNOSIS — I872 Venous insufficiency (chronic) (peripheral): Secondary | ICD-10-CM | POA: Diagnosis not present

## 2016-12-02 DIAGNOSIS — R4189 Other symptoms and signs involving cognitive functions and awareness: Secondary | ICD-10-CM

## 2016-12-02 DIAGNOSIS — E038 Other specified hypothyroidism: Secondary | ICD-10-CM | POA: Diagnosis not present

## 2016-12-02 DIAGNOSIS — I10 Essential (primary) hypertension: Secondary | ICD-10-CM | POA: Diagnosis not present

## 2016-12-02 NOTE — Progress Notes (Signed)
Location:   [facility] Room Number: 142/D Place of Service:  SNF (31) Provider:  Pollyann SavoyAnjali,Gupta  [name] Gupta, MD  Patient Care Team: Mahlon GammonAnjali L Gupta, MD as PCP - General (Internal Medicine)  Extended Emergency Contact Information Primary Emergency Contact: Branham,Nathan Address: [address]          [place], [place] Home Phone: [phone] Mobile Phone: [phone] Relation: Son  Code Status:  DNR Goals of care: Advanced Directive information Advanced Directives 12/02/2016  Does Patient Have a Medical Advance Directive? Yes  Type of Advance Directive Out of facility DNR (pink MOST or yellow form)  Does patient want to make changes to medical advance directive? No - Patient declined  Copy of Healthcare Power of Attorney in Chart? -  Would patient like information on creating a medical advance directive? -  Pre-existing out of facility DNR order (yellow form or pink MOST form) -     Chief Complaint  Patient presents with  . Medical Management of Chronic Issues    Routine Visit    HPI:  Pt is a [age] y.o. female seen today for medical management of chronic diseases.  Patient has h/o Hypertension, Hypothyroidism, Vit D deficiency and GERD. Patient has been pretty stable. She had one episode again of altered mental status like she lost consciousness. She responded when her son called for her and she became responsive again. Her labs and ECG was normal. Her son did not want anymore evaluation done. This happened on  11/08. And patient has been stable since then with no new episodes. Patient otherwise has been stable Her weight is stable at 143.6 lbs. She is hard of hearing but very pleasant and looks comfortable in her wheel chair.   Past Medical History:  Diagnosis Date  . Back pain   . Confusion   . DNR (do not resuscitate)   . GERD (gastroesophageal reflux disease)   . Glaucoma   . HOH (hard of hearing)   .  Pelvic fracture (HCC)    snf  . Thyroid disease    Past Surgical History:  Procedure Laterality Date  . ABDOMINAL SURGERY    . CATARACT EXTRACTION    . HEMORRHOID SURGERY      Allergies  Allergen Reactions  . Latex Itching  . Onion Nausea And Vomiting  . Other     SPANDEX ALLERGY  . Penicillins Other (See Comments)    UNKNOWN REACTION  . Sulfa Antibiotics Other (See Comments)    UNKNOWN REACTION      Medication List       Accurate as of 12/02/16 11:18 AM. Always use your most recent med list.          aspirin 81 MG EC tablet Take 1 tablet (81 mg total) by mouth daily.   calcitonin (salmon) 200 UNIT/ACT nasal spray Commonly known as:  MIACALCIN/FORTICAL Place 1 spray into alternate nostrils daily.   CALCIUM 600 + D PO Take 1 tablet by mouth every morning.   cetaphil cream Apply topical to legs lightly  twice a day   collagenase ointment Commonly known as:  SANTYL Apply 1 application topically daily.   gabapentin 100 MG capsule Commonly known as:  NEURONTIN Take 1 capsule (100 mg total) by mouth 2 (two) times daily.   ibuprofen 200 MG tablet Commonly known as:  ADVIL,MOTRIN Take 200 mg by mouth daily.   ibuprofen 200 MG tablet Commonly known as:  ADVIL,MOTRIN Take  200 mg by mouth every 6 (six) hours as needed.   levothyroxine 75 MCG tablet Commonly known as:  SYNTHROID, LEVOTHROID Take 75 mcg by mouth daily before breakfast.   polycarbophil 625 MG tablet Commonly known as:  FIBERCON Take 625 mg by mouth at bedtime.   polyethylene glycol packet Commonly known as:  MIRALAX / GLYCOLAX Take 17 g by mouth daily.   ranitidine 150 MG tablet Commonly known as:  ZANTAC Take 150 mg by mouth at bedtime.   timolol 0.5 % ophthalmic solution Commonly known as:  BETIMOL Place 1 drop into both eyes at bedtime.   traMADol 50 MG tablet Commonly known as:  ULTRAM Take one tablet by mouth every 8 hours as needed for moderate to severe pain unrelieved by  ibuprofen   Vitamin D3 1000 units Caps Take 1 capsule by mouth every morning.       Review of Systems  Reason unable to perform ROS: very unrelaible due to her dementia and hard of hearing.    Immunization History  Administered Date(s) Administered  . Influenza-Unspecified 10/05/2014, 09/27/2016  . PPD Test 02/14/2015  . Pneumococcal-Unspecified 12/30/1978, 10/10/2016   Pertinent  Health Maintenance Due  Topic Date Due  . DEXA SCAN  05/22/2017 (Originally 04/14/1979)  . PNA vac Low Risk Adult (2 of 2 - PCV13) 10/10/2017  . INFLUENZA VACCINE  Completed   No flowsheet data found. Functional Status Survey:    Vitals:   12/02/16 1117  BP: (!) 110/52  Pulse: 80  Resp: (!) 22  Temp: 98.2 F (36.8 C)  TempSrc: Oral   There is no height or weight on file to calculate BMI. Physical Exam  Constitutional: She appears well-developed and well-nourished.  HENT:  Head: Normocephalic.  Mouth/Throat: Oropharynx is clear and moist.  Cardiovascular: Normal rate, regular rhythm and normal heart sounds.   Pulmonary/Chest: Effort normal and breath sounds normal. No respiratory distress. She has no wheezes. She has no rales.  Abdominal: Soft. Bowel sounds are normal. She exhibits no distension. There is no tenderness. There is no rebound.  Musculoskeletal: She exhibits edema.  Edema in both legs which are wrapped as d/w nurse patient has 2-3 open areas on her right leg.  Neurological: She is alert.  Psychiatric: She has a normal mood and affect. Her behavior is normal. Judgment and thought content normal.    Labs reviewed:  Recent Labs  2016/06/26 1559 10/29/16 0500 11/07/16 0900  NA 132* 132* 135  K 4.1 4.1 4.7  CL 98* 97* 101  CO2 29 29 24   GLUCOSE 109* 93 121*  BUN 21* 18 17  CREATININE 0.88 0.86 0.94  CALCIUM 9.0 9.0 9.7    Recent Labs  03/05/16 0800 04/08/16 0600 11/07/16 0900  AST 20 15 23   ALT 17 13* 14  ALKPHOS 67 51 59  BILITOT 0.7 0.4 0.6  PROT 8.0 6.7  7.3  ALBUMIN 4.0 3.5 3.6    Recent Labs  03/05/16 0800  2016/06/26 1559 10/29/16 0500 11/07/16 0900  WBC 5.7  < > 6.5 5.6 4.9  NEUTROABS 2.8  --   --  3.2 2.7  HGB 14.8  < > 13.6 13.2 14.8  HCT 45.6  < > 41.1 38.9 44.3  MCV 90.1  < > 91.7 89.8 89.9  PLT 344  < > 340 342 224  < > = values in this interval not displayed. Lab Results  Component Value Date   TSH 2.607 11/07/2016   Lab Results  Component Value  Date   HGBA1C 5.8 (H) 01/30/2015   Lab Results  Component Value Date   CHOL 105 01/30/2015   HDL 44 01/30/2015   LDLCALC 45 01/30/2015   TRIG 78 01/30/2015   CHOLHDL 2.4 01/30/2015    Significant Diagnostic Results in last 30 days:  No results found.  Assessment/Plan  Episode of unresponsiveness With all the labs normal it seems more neurological or cardiac. Patient on aspirin. But agree with the son not to be aggressive at this time. Will continue to provide support.Patient has been stable since then.  Hypertension Patient BP has been normal. Norvasc was stopped few months ago.  GERD. Stable on ranitidine.  Hypothyroidism TSH was normal in 08/17. Continue same dose.  Chronic back pain due to osteoporosis  Stable on Ultram and Neurontin and calcitonin spray Discontinue PRN ibuprofen.  Chronic venous stasis with stage 2 ulcers Continue wrapping the leg to keep edema down. D/w Wound care nurse.  Family/ staff Communication:   Labs/tests ordered:

## 2017-01-06 ENCOUNTER — Encounter: Payer: Self-pay | Admitting: Internal Medicine

## 2017-01-06 ENCOUNTER — Non-Acute Institutional Stay (SKILLED_NURSING_FACILITY): Payer: 59 | Admitting: Internal Medicine

## 2017-01-06 DIAGNOSIS — M5489 Other dorsalgia: Secondary | ICD-10-CM | POA: Diagnosis not present

## 2017-01-06 DIAGNOSIS — I872 Venous insufficiency (chronic) (peripheral): Secondary | ICD-10-CM | POA: Diagnosis not present

## 2017-01-06 DIAGNOSIS — E038 Other specified hypothyroidism: Secondary | ICD-10-CM

## 2017-01-06 DIAGNOSIS — I10 Essential (primary) hypertension: Secondary | ICD-10-CM | POA: Diagnosis not present

## 2017-01-06 NOTE — Progress Notes (Deleted)
Location:   [facility] Room Number: 142/D Place of Service:  SNF [phone](31) Provider:  Edmon CrapeArlo Lassen  Mahlon GammonAnjali L Gupta, MD  Patient Care Team: Mahlon GammonAnjali L Gupta, MD as PCP - General (Internal Medicine)  Extended Emergency Contact Information Primary Emergency Contact: Tomlinson,Nathan Address: [address]          [place], [place] Home Phone: [phone] Mobile Phone: [phone] Relation: Son  Code Status:  DNR Goals of care: Advanced Directive information Advanced Directives 01/06/2017  Does Patient Have a Medical Advance Directive? Yes  Type of Advance Directive Out of facility DNR (pink MOST or yellow form)  Does patient want to make changes to medical advance directive? No - Patient declined  Copy of Healthcare Power of Attorney in Chart? -  Would patient like information on creating a medical advance directive? -  Pre-existing out of facility DNR order (yellow form or pink MOST form) -     Chief Complaint  Patient presents with  . Acute Visit    Right lower leg black    HPI:  Pt is a [age] y.o. female seen today for an acute visit for    Past Medical History:  Diagnosis Date  . Back pain   . Confusion   . DNR (do not resuscitate)   . GERD (gastroesophageal reflux disease)   . Glaucoma   . HOH (hard of hearing)   . Pelvic fracture (HCC)    snf  . Thyroid disease    Past Surgical History:  Procedure Laterality Date  . ABDOMINAL SURGERY    . CATARACT EXTRACTION    . HEMORRHOID SURGERY      Allergies  Allergen Reactions  . Latex Itching  . Onion Nausea And Vomiting  . Other     SPANDEX ALLERGY  . Penicillins Other (See Comments)    UNKNOWN REACTION  . Sulfa Antibiotics Other (See Comments)    UNKNOWN REACTION    Allergies as of 01/06/2017      Reactions   Latex Itching   Onion Nausea And Vomiting   Other    SPANDEX ALLERGY   Penicillins Other (See Comments)   UNKNOWN REACTION   Sulfa Antibiotics  Other (See Comments)   UNKNOWN REACTION      Medication List       Accurate as of 01/06/17  3:06 PM. Always use your most recent med list.          aspirin 81 MG EC tablet Take 1 tablet (81 mg total) by mouth daily.   calcitonin (salmon) 200 UNIT/ACT nasal spray Commonly known as:  MIACALCIN/FORTICAL Place 1 spray into alternate nostrils daily.   CALCIUM 600 + D PO Take 1 tablet by mouth every morning.   cetaphil cream Apply topical to legs lightly  twice a day   collagenase ointment Commonly known as:  SANTYL Apply 1 application topically daily.   gabapentin 100 MG capsule Commonly known as:  NEURONTIN Take 1 capsule (100 mg total) by mouth 2 (two) times daily.   ibuprofen 200 MG tablet Commonly known as:  ADVIL,MOTRIN Take 200 mg by mouth daily.   levothyroxine 75 MCG tablet Commonly known as:  SYNTHROID, LEVOTHROID Take 75 mcg by mouth daily before breakfast.   polycarbophil 625 MG tablet Commonly known as:  FIBERCON Take 625 mg by mouth at bedtime.   polyethylene glycol packet Commonly known as:  MIRALAX / GLYCOLAX Take 17 g by mouth daily.  ranitidine 150 MG tablet Commonly known as:  ZANTAC Take 150 mg by mouth at bedtime.   timolol 0.5 % ophthalmic solution Commonly known as:  BETIMOL Place 1 drop into both eyes at bedtime.   traMADol 50 MG tablet Commonly known as:  ULTRAM Take one tablet by mouth every 8 hours as needed for moderate to severe pain unrelieved by ibuprofen   Vitamin D3 1000 units Caps Take 1 capsule by mouth every morning.       Review of Systems  Immunization History  Administered Date(s) Administered  . DT 12/30/2010  . Influenza, High Dose Seasonal PF 02/05/2013, 12/10/2013, 10/05/2014  . Influenza-Unspecified 11/06/2009, 10/26/2010, 10/05/2014, 09/27/2016  . PPD Test 02/14/2015  . Pneumococcal Polysaccharide-23 10/26/2010  . Pneumococcal-Unspecified 12/30/1978, 10/10/2016  . Td 12/30/2010   Pertinent  Health  Maintenance Due  Topic Date Due  . DEXA SCAN  05/22/2017 (Originally 04/14/1979)  . PNA vac Low Risk Adult (2 of 2 - PCV13) 10/10/2017  . INFLUENZA VACCINE  Completed   No flowsheet data found. Functional Status Survey:    There were no vitals filed for this visit. There is no height or weight on file to calculate BMI. Physical Exam  Labs reviewed:  Recent Labs  08/03/2016 1559 10/29/16 0500 11/07/16 0900  NA 132* 132* 135  K 4.1 4.1 4.7  CL 98* 97* 101  CO2 29 29 24   GLUCOSE 109* 93 121*  BUN 21* 18 17  CREATININE 0.88 0.86 0.94  CALCIUM 9.0 9.0 9.7    Recent Labs  03/05/16 0800 04/08/16 0600 11/07/16 0900  AST 20 15 23   ALT 17 13* 14  ALKPHOS 67 51 59  BILITOT 0.7 0.4 0.6  PROT 8.0 6.7 7.3  ALBUMIN 4.0 3.5 3.6    Recent Labs  03/05/16 0800  08/06/2016 1559 10/29/16 0500 11/07/16 0900  WBC 5.7  < > 6.5 5.6 4.9  NEUTROABS 2.8  --   --  3.2 2.7  HGB 14.8  < > 13.6 13.2 14.8  HCT 45.6  < > 41.1 38.9 44.3  MCV 90.1  < > 91.7 89.8 89.9  PLT 344  < > 340 342 224  < > = values in this interval not displayed. Lab Results  Component Value Date   TSH 2.607 11/07/2016   Lab Results  Component Value Date   HGBA1C 5.8 (H) 01/30/2015   Lab Results  Component Value Date   CHOL 105 01/30/2015   HDL 44 01/30/2015   LDLCALC 45 01/30/2015   TRIG 78 01/30/2015   CHOLHDL 2.4 01/30/2015    Significant Diagnostic Results in last 30 days:  No results found.  Assessment/Plan There are no diagnoses linked to this encounter.      London Sheer, [place] [phone]

## 2017-01-06 NOTE — Progress Notes (Signed)
Location:   [facility] Room Number: 142/D Place of Service:  SNF (31) Provider:  Sabino Dick, MD  Patient Care Team: Mahlon Gammon, MD as PCP - General (Internal Medicine)  Extended Emergency Contact Information Primary Emergency Contact: Souder,Nathan Address: [address]          [place], [place] Home Phone: [phone] Mobile Phone: [phone] Relation: Son  Code Status:  DNR Goals of care: Advanced Directive information Advanced Directives 01/06/2017  Does Patient Have a Medical Advance Directive? Yes  Type of Advance Directive Out of facility DNR (pink MOST or yellow form)  Does patient want to make changes to medical advance directive? No - Patient declined  Copy of Healthcare Power of Attorney in Chart? -  Would patient like information on creating a medical advance directive? -  Pre-existing out of facility DNR order (yellow form or pink MOST form) -    Chief complaint-medical management of chronic medical conditions including chronic back pain-hypothyroidism-hypertension-GERD-chronic venous stasis-   HPI:  Pt is a 83 y.o. female seen today for medical management of chronic diseases.  As noted above.  She continues to be quite stable her weight is stable--she has no complaints today.  Previously she has had periods of unresponsiveness that her transitory in nature-workup has not really reveal any acute reason for this-she has not had one however in a while.  She does have a history of hypertension Norvasc was discontinued secondary to lower readings-blood pressure today is 154/80 we will check this daily for now to see if she needs any adjustment.  She also has a history hypothyroidism she is on Synthroid 75 g a day TSH in November 2017 was within normal range at 2.607.  She does have a history of chronic back pain she is on tramadol and Neurontin as well as ibuprofen which she is on once  a day.  Regards osteoporosis she is on calcitonin spray also on Os-Cal with vitamin D.  Currently she has no acute complaints she is actually visiting with her son who is very supportive.  I do note nursing left a note about a blackened area on her right leg --I did discuss this with the wound care nurse she says the area actually looks improved there is no drainage bleeding or sign of cellulitis here-she feels the area is slowly resolving-although she remains at risk with significant venous stasis changes   Allergies  Allergen Reactions  . Latex Itching  . Onion Nausea And Vomiting  . Other     SPANDEX ALLERGY  . Penicillins Other (See Comments)    UNKNOWN REACTION  . Sulfa Antibiotics Other (See Comments)    UNKNOWN REACTION    Allergies as of 01/06/2017      Reactions   Latex Itching   Onion Nausea And Vomiting   Other    SPANDEX ALLERGY   Penicillins Other (See Comments)   UNKNOWN REACTION   Sulfa Antibiotics Other (See Comments)   UNKNOWN REACTION      Medication List       Accurate as of 01/06/17  4:29 PM. Always use your most recent med list.          aspirin 81 MG EC tablet Take 1 tablet (81 mg total) by mouth daily.   calcitonin (salmon) 200 UNIT/ACT nasal spray Commonly known as:  MIACALCIN/FORTICAL Place 1 spray into alternate nostrils daily.   CALCIUM 600 + D PO  Take 1 tablet by mouth every morning.   cetaphil cream Apply topical to legs lightly  twice a day   collagenase ointment Commonly known as:  SANTYL Apply 1 application topically daily.   gabapentin 100 MG capsule Commonly known as:  NEURONTIN Take 1 capsule (100 mg total) by mouth 2 (two) times daily.   ibuprofen 200 MG tablet Commonly known as:  ADVIL,MOTRIN Take 200 mg by mouth daily.   levothyroxine 75 MCG tablet Commonly known as:  SYNTHROID, LEVOTHROID Take 75 mcg by mouth daily before breakfast.   polycarbophil 625 MG tablet Commonly known as:  FIBERCON Take 625 mg by mouth  at bedtime.   polyethylene glycol packet Commonly known as:  MIRALAX / GLYCOLAX Take 17 g by mouth daily.   ranitidine 150 MG tablet Commonly known as:  ZANTAC Take 150 mg by mouth at bedtime.   timolol 0.5 % ophthalmic solution Commonly known as:  BETIMOL Place 1 drop into both eyes at bedtime.   traMADol 50 MG tablet Commonly known as:  ULTRAM Take one tablet by mouth every 8 hours as needed for moderate to severe pain unrelieved by ibuprofen   Vitamin D3 1000 units Caps Take 1 capsule by mouth every morning.       Review of Systems   This is very limited secondary patient being hard of hearing and appears to have some element of dementia-however she does not have any complaints today again her unresponsive episodes have been quite infrequent and is been a while since she's had one    Immunization History  Administered Date(s) Administered  . DT 12/30/2010  . Influenza, High Dose Seasonal PF 02/05/2013, 12/10/2013, 10/05/2014  . Influenza-Unspecified 11/06/2009, 10/26/2010, 10/05/2014, 09/27/2016  . PPD Test 02/14/2015  . Pneumococcal Polysaccharide-23 10/26/2010  . Pneumococcal-Unspecified 12/30/1978, 10/10/2016  . Td 12/30/2010   Pertinent  Health Maintenance Due  Topic Date Due  . DEXA SCAN  05/22/2017 (Originally 04/14/1979)  . PNA vac Low Risk Adult (2 of 2 - PCV13) 10/10/2017  . INFLUENZA VACCINE  Completed   No flowsheet data found. Functional Status Survey:    Vitals:   01/06/17 1629  BP: 129/84  Pulse: 82  Resp: 20  Temp: 97.8 F (36.6 C)  TempSrc: Oral  Again blood pressures afternoon was 154/80.  Her weight is stable at 145.8 pounds  Physical Exam Constitutional: Frail elderly female who is bright and alert pleasant smiling-sitting comfortably in her wheelchair Skin-she has numerous solar induced changes on her face and arms legs with crusting  HENT:  Head: Normocephalic.  Mouth/Throat: Oropharynx is clear and moist.  Cardiovascular:  Normal rate, regular rhythm and normal heart sounds.   Pulmonary/Chest: Effort normal and breath sounds normal. No respiratory distress. She has no wheezes. She has no rales.  Abdominal: Soft. Bowel sounds are normal. She exhibits no distension. There is no tenderness. There is no rebound.  Musculoskeletal: She exhibits edema. Right greater than left which is chronic  -right shinwrapped as d/w nurse patient has odarkened area on her right leg.  Neurological: She is alert.  Psychiatric: She has a normal mood and affect. Her behavior is normal. Judgment and thought content normal. --Pleasant smiling quite hard of hearing but as usual appreciative of our visit  Labs reviewed:  Recent Labs  08/28/2016 1559 10/29/16 0500 11/07/16 0900  NA 132* 132* 135  K 4.1 4.1 4.7  CL 98* 97* 101  CO2 29 29 24   GLUCOSE 109* 93 121*  BUN 21* 18  17  CREATININE 0.88 0.86 0.94  CALCIUM 9.0 9.0 9.7    Recent Labs  03/05/16 0800 04/08/16 0600 11/07/16 0900  AST 20 15 23   ALT 17 13* 14  ALKPHOS 67 51 59  BILITOT 0.7 0.4 0.6  PROT 8.0 6.7 7.3  ALBUMIN 4.0 3.5 3.6    Recent Labs  03/05/16 0800  08/10/2016 1559 10/29/16 0500 11/07/16 0900  WBC 5.7  < > 6.5 5.6 4.9  NEUTROABS 2.8  --   --  3.2 2.7  HGB 14.8  < > 13.6 13.2 14.8  HCT 45.6  < > 41.1 38.9 44.3  MCV 90.1  < > 91.7 89.8 89.9  PLT 344  < > 340 342 224  < > = values in this interval not displayed. Lab Results  Component Value Date   TSH 2.607 11/07/2016   Lab Results  Component Value Date   HGBA1C 5.8 (H) 01/30/2015   Lab Results  Component Value Date   CHOL 105 01/30/2015   HDL 44 01/30/2015   LDLCALC 45 01/30/2015   TRIG 78 01/30/2015   CHOLHDL 2.4 01/30/2015    Significant Diagnostic Results in last 30 days:  No results found.  Assessment/Plan  #1 hypertension-at times systolic is elevated does not appear to be consistent however will order blood pressure checks daily with a log for review next week secondary to  her advanced age would be hesitant to be real aggressive.  #2 hypothyroidism this appears to be stable on Synthroid recent TSH within normal limits back in November.  #3 chronic back pain at this point appears to be better controlled she does receive tramadol Neurontin she is also on ibuprofen once a day also continues on calcium supplementation.  #4 history of chronic venous stasis as noted above this is followed closely by the wound care nurse she does have a darkened area on her right shin-this is being watched in according to nursing is improving with no sign of cellulitis.  #5-history GERD this appears stable on ranitidine.  #6 history of unresponsive episodes-no clear etiology here labs have been normal-she is on aspirin-her son does not wish to be aggressive with this clinically she appears to be stable.  [id number]

## 2017-02-13 ENCOUNTER — Encounter: Payer: Self-pay | Admitting: Internal Medicine

## 2017-02-13 ENCOUNTER — Non-Acute Institutional Stay (SKILLED_NURSING_FACILITY): Payer: 59 | Admitting: Internal Medicine

## 2017-02-13 DIAGNOSIS — E038 Other specified hypothyroidism: Secondary | ICD-10-CM | POA: Diagnosis not present

## 2017-02-13 DIAGNOSIS — I10 Essential (primary) hypertension: Secondary | ICD-10-CM

## 2017-02-13 DIAGNOSIS — I872 Venous insufficiency (chronic) (peripheral): Secondary | ICD-10-CM | POA: Diagnosis not present

## 2017-02-13 DIAGNOSIS — K219 Gastro-esophageal reflux disease without esophagitis: Secondary | ICD-10-CM

## 2017-02-13 NOTE — Progress Notes (Signed)
Location:   [facility] Room Number: 142/D Place of Service:  SNF (31) Provider:  Pollyann SavoyAnjali,Wyndham Santilli L Endy Easterly, MD  Patient Care Team: Mahlon GammonAnjali L Ladelle Teodoro, MD as PCP - General (Internal Medicine)  Extended Emergency Contact Information Primary Emergency Contact: Ngu,Nathan Address: [address]          [place], [place] Home Phone: [phone] Mobile Phone: [phone] Relation: Son  Code Status:  DNR Goals of care: Advanced Directive information Advanced Directives 02/13/2017  Does Patient Have a Medical Advance Directive? Yes  Type of Advance Directive Out of facility DNR (pink MOST or yellow form)  Does patient want to make changes to medical advance directive? No - Patient declined  Copy of Healthcare Power of Attorney in Chart? -  Would patient like information on creating a medical advance directive? -  Pre-existing out of facility DNR order (yellow form or pink MOST form) -     Chief Complaint  Patient presents with  . Medical Management of Chronic Issues    Routine Visit    HPI:  Pt is a [age] y.o. female seen today for medical management of chronic diseases.    Pt is a [age] y.o. female seen today for medical management of chronic diseases.  Patient has h/o Hypertension, Hypothyroidism, Vit D deficiency and GERD.  Patient has been doing well in Facility. Her weight is Stable at 144 lbs. She denied any new problems. She has been stable since last routine visit.  Past Medical History:  Diagnosis Date  . Back pain   . Confusion   . DNR (do not resuscitate)   . GERD (gastroesophageal reflux disease)   . Glaucoma   . HOH (hard of hearing)   . Pelvic fracture (HCC)    snf  . Thyroid disease    Past Surgical History:  Procedure Laterality Date  . ABDOMINAL SURGERY    . CATARACT EXTRACTION    . HEMORRHOID SURGERY      Allergies  Allergen Reactions  . Latex Itching  . Onion Nausea And Vomiting  .  Other     SPANDEX ALLERGY  . Penicillins Other (See Comments)    UNKNOWN REACTION  . Sulfa Antibiotics Other (See Comments)    UNKNOWN REACTION    Allergies as of 02/13/2017      Reactions   Latex Itching   Onion Nausea And Vomiting   Other    SPANDEX ALLERGY   Penicillins Other (See Comments)   UNKNOWN REACTION   Sulfa Antibiotics Other (See Comments)   UNKNOWN REACTION      Medication List       Accurate as of 02/13/17  2:29 PM. Always use your most recent med list.          aspirin 81 MG EC tablet Take 1 tablet (81 mg total) by mouth daily.   calcitonin (salmon) 200 UNIT/ACT nasal spray Commonly known as:  MIACALCIN/FORTICAL Place 1 spray into alternate nostrils daily.   CALCIUM 600 + D PO Take 1 tablet by mouth every morning.   cetaphil cream Apply topical to legs lightly  twice a day   collagenase ointment Commonly known as:  SANTYL Apply 1 application topically daily.   gabapentin 100 MG capsule Commonly known as:  NEURONTIN Take 1 capsule (100 mg total) by mouth 2 (two) times daily.   ibuprofen 200 MG tablet Commonly known as:  ADVIL,MOTRIN Take 200 mg by mouth daily.   levothyroxine  75 MCG tablet Commonly known as:  SYNTHROID, LEVOTHROID Take 75 mcg by mouth daily before breakfast.   polycarbophil 625 MG tablet Commonly known as:  FIBERCON Take 625 mg by mouth at bedtime.   polyethylene glycol packet Commonly known as:  MIRALAX / GLYCOLAX Take 17 g by mouth daily.   ranitidine 150 MG tablet Commonly known as:  ZANTAC Take 150 mg by mouth at bedtime.   timolol 0.5 % ophthalmic solution Commonly known as:  BETIMOL Place 1 drop into both eyes at bedtime.   traMADol 50 MG tablet Commonly known as:  ULTRAM Take one tablet by mouth every 8 hours as needed for moderate to severe pain unrelieved by ibuprofen   Vitamin D3 1000 units Caps Take 1 capsule by mouth every morning.       Review of Systems  Review of Systems  Constitutional:  Negative for activity change, appetite change, chills, diaphoresis, fatigue and fever.  HENT: Negative for mouth sores, postnasal drip, rhinorrhea, sinus pain and sore throat.   Respiratory: Negative for apnea, cough, chest tightness, shortness of breath and wheezing.   Cardiovascular: Negative for chest pain, palpitations and leg swelling.  Gastrointestinal: Negative for abdominal distention, abdominal pain, constipation, diarrhea, nausea and vomiting.  Genitourinary: Negative for dysuria and frequency.  Musculoskeletal: Negative for arthralgias, joint swelling and myalgias.  Skin: Negative for rash.  Neurological: Negative for dizziness, syncope, weakness, light-headedness and numbness.  Psychiatric/Behavioral: Negative for behavioral problems, confusion and sleep disturbance.     Immunization History  Administered Date(s) Administered  . DT 12/30/2010  . Influenza, High Dose Seasonal PF 02/05/2013, 12/10/2013, 10/05/2014  . Influenza-Unspecified 11/06/2009, 10/26/2010, 10/05/2014, 09/27/2016  . PPD Test 02/14/2015  . Pneumococcal Polysaccharide-23 10/26/2010  . Pneumococcal-Unspecified 12/30/1978, 10/10/2016  . Td 12/30/2010   Pertinent  Health Maintenance Due  Topic Date Due  . DEXA SCAN  05/22/2017 (Originally 04/14/1979)  . PNA vac Low Risk Adult (2 of 2 - PCV13) 10/10/2017  . INFLUENZA VACCINE  Completed   No flowsheet data found. Functional Status Survey:    Vitals:   02/13/17 1412  BP: (!) 139/96  Pulse: 87  Resp: 20  Temp: 98.4 F (36.9 C)  TempSrc: Oral  SpO2: 98%   There is no height or weight on file to calculate BMI. Physical Exam  Constitutional: She appears well-developed and well-nourished.  HENT:  Head: Normocephalic.  Mouth/Throat: Oropharynx is clear and moist.  Eyes: Pupils are equal, round, and reactive to light.  Neck: Neck supple.  Cardiovascular: Normal rate, regular rhythm and normal heart sounds.   No murmur heard. Pulmonary/Chest: Effort  normal and breath sounds normal. No respiratory distress. She has no wheezes.  Abdominal: Soft. Bowel sounds are normal. She exhibits no distension. There is no tenderness. There is no rebound.  Musculoskeletal:  Mild Edema B/L  Neurological: She is alert.  Skin: Skin is warm.  Her right leg has small wound with clear margins and surface due to Venous stasis. Left leg is clear with chronic Stasis changes.  Psychiatric: She has a normal mood and affect. Her behavior is normal.    Labs reviewed:  Recent Labs  08/29/2016 1559 10/29/16 0500 11/07/16 0900  NA 132* 132* 135  K 4.1 4.1 4.7  CL 98* 97* 101  CO2 29 29 24   GLUCOSE 109* 93 121*  BUN 21* 18 17  CREATININE 0.88 0.86 0.94  CALCIUM 9.0 9.0 9.7    Recent Labs  03/05/16 0800 04/08/16 0600 11/07/16 0900  AST  20 15 23   ALT 17 13* 14  ALKPHOS 67 51 59  BILITOT 0.7 0.4 0.6  PROT 8.0 6.7 7.3  ALBUMIN 4.0 3.5 3.6    Recent Labs  03/05/16 0800  08/26/2016 1559 10/29/16 0500 11/07/16 0900  WBC 5.7  < > 6.5 5.6 4.9  NEUTROABS 2.8  --   --  3.2 2.7  HGB 14.8  < > 13.6 13.2 14.8  HCT 45.6  < > 41.1 38.9 44.3  MCV 90.1  < > 91.7 89.8 89.9  PLT 344  < > 340 342 224  < > = values in this interval not displayed. Lab Results  Component Value Date   TSH 2.607 11/07/2016   Lab Results  Component Value Date   HGBA1C 5.8 (H) 01/30/2015   Lab Results  Component Value Date   CHOL 105 01/30/2015   HDL 44 01/30/2015   LDLCALC 45 01/30/2015   TRIG 78 01/30/2015   CHOLHDL 2.4 01/30/2015    Significant Diagnostic Results in last 30 days:  No results found.  Assessment/Plan  Hypertension Patient BP has been slightly elevated. Her Norvasc was stopped few months ago. But with her episodes of unresponsives and her age and frailty will continue to monitor and not be too aggressive with her BP control  GERD. Stable on ranitidine.  Hypothyroidism TSH was normal in 11/17. Continue same dose.   Chronic back pain due to  osteoporosis  Stable on Ultram and Neurontin and calcitonin spray   Chronic venous stasis with stage 2 ulcers  She now just need dry dressing D/w Wound care nurse.  Episode of unresponsiveness Nothing New recently. With all the labs normal it seems more neurological or cardiac. Patient on aspirin.  Son Does not want to be aggressive. Will continue to provide support.  Family/ staff Communication:   Labs/tests ordered:  CMP, CBC with Diff

## 2017-02-14 ENCOUNTER — Encounter (HOSPITAL_COMMUNITY)
Admission: RE | Admit: 2017-02-14 | Discharge: 2017-02-14 | Disposition: A | Discharge status: Home / Self Care | Payer: 59 | Source: Skilled Nursing Facility | Attending: Internal Medicine | Admitting: Internal Medicine

## 2017-02-14 DIAGNOSIS — M810 Age-related osteoporosis without current pathological fracture: Secondary | ICD-10-CM | POA: Diagnosis present

## 2017-02-14 DIAGNOSIS — Z8781 Personal history of (healed) traumatic fracture: Secondary | ICD-10-CM | POA: Insufficient documentation

## 2017-02-14 DIAGNOSIS — I639 Cerebral infarction, unspecified: Secondary | ICD-10-CM | POA: Diagnosis present

## 2017-02-14 DIAGNOSIS — I872 Venous insufficiency (chronic) (peripheral): Secondary | ICD-10-CM | POA: Insufficient documentation

## 2017-02-14 DIAGNOSIS — M549 Dorsalgia, unspecified: Secondary | ICD-10-CM | POA: Insufficient documentation

## 2017-02-14 LAB — COMPREHENSIVE METABOLIC PANEL
ALBUMIN: 3.3 g/dL — AB (ref 3.5–5.0)
ALK PHOS: 48 U/L (ref 38–126)
ALT: 10 U/L — ABNORMAL LOW (ref 14–54)
ANION GAP: 7 (ref 5–15)
AST: 14 U/L — AB (ref 15–41)
BUN: 20 mg/dL (ref 6–20)
CO2: 29 mmol/L (ref 22–32)
Calcium: 9.1 mg/dL (ref 8.9–10.3)
Chloride: 98 mmol/L — ABNORMAL LOW (ref 101–111)
Creatinine, Ser: 0.74 mg/dL (ref 0.44–1.00)
GFR calc Af Amer: >60 mL/min (ref >60)
GFR calc non Af Amer: >60 mL/min (ref >60)
GLUCOSE: 90 mg/dL (ref 65–99)
POTASSIUM: 4 mmol/L (ref 3.5–5.1)
SODIUM: 134 mmol/L — AB (ref 135–145)
Total Bilirubin: 0.4 mg/dL (ref 0.3–1.2)
Total Protein: 6.5 g/dL (ref 6.5–8.1)

## 2017-02-14 LAB — CBC WITH DIFFERENTIAL/PLATELET
BASOS ABS: 0.1 10*3/uL (ref 0.0–0.1)
BASOS PCT: 2 %
EOS ABS: 0.3 10*3/uL (ref 0.0–0.7)
Eosinophils Relative: 7 %
HCT: 39.8 % (ref 36.0–46.0)
HEMOGLOBIN: 13.2 g/dL (ref 12.0–15.0)
Lymphocytes Relative: 35 %
Lymphs Abs: 1.7 10*3/uL (ref 0.7–4.0)
MCH: 29.7 pg (ref 26.0–34.0)
MCHC: 33.2 g/dL (ref 30.0–36.0)
MCV: 89.4 fL (ref 78.0–100.0)
Monocytes Absolute: 0.5 10*3/uL (ref 0.1–1.0)
Monocytes Relative: 11 %
NEUTROS ABS: 2.1 10*3/uL (ref 1.7–7.7)
NEUTROS PCT: 45 %
Platelets: 325 10*3/uL (ref 150–400)
RBC: 4.45 MIL/uL (ref 3.87–5.11)
RDW: 14.2 % (ref 11.5–15.5)
WBC: 4.8 10*3/uL (ref 4.0–10.5)

## 2017-04-07 ENCOUNTER — Encounter: Payer: Self-pay | Admitting: Internal Medicine

## 2017-04-07 ENCOUNTER — Non-Acute Institutional Stay (SKILLED_NURSING_FACILITY): Payer: 59 | Admitting: Internal Medicine

## 2017-04-07 DIAGNOSIS — K219 Gastro-esophageal reflux disease without esophagitis: Secondary | ICD-10-CM

## 2017-04-07 DIAGNOSIS — I10 Essential (primary) hypertension: Secondary | ICD-10-CM

## 2017-04-07 DIAGNOSIS — I872 Venous insufficiency (chronic) (peripheral): Secondary | ICD-10-CM | POA: Diagnosis not present

## 2017-04-07 DIAGNOSIS — M5489 Other dorsalgia: Secondary | ICD-10-CM | POA: Diagnosis not present

## 2017-04-07 DIAGNOSIS — E038 Other specified hypothyroidism: Secondary | ICD-10-CM

## 2017-04-07 NOTE — Progress Notes (Signed)
Location:   [facility] Room Number: 142/D Place of Service:  SNF (31) Provider:  Sabino Dick, MD  Patient Care Team: Mahlon Gammon, MD as PCP - General (Internal Medicine)  Extended Emergency Contact Information Primary Emergency Contact: Trudell,Nathan Address: [address]          [place], [place] Home Phone: [phone] Mobile Phone: [phone] Relation: Son  Code Status:  DNR Goals of care: Advanced Directive information Advanced Directives 04/07/2017  Does Patient Have a Medical Advance Directive? Yes  Type of Advance Directive Out of facility DNR (pink MOST or yellow form)  Does patient want to make changes to medical advance directive? No - Patient declined  Copy of Healthcare Power of Attorney in Chart? -  Would patient like information on creating a medical advance directive? -  Pre-existing out of facility DNR order (yellow form or pink MOST form) -     Chief Complaint  Patient presents with  . Medical Management of Chronic Issues    Routine Visit   Medical management of chronic medical conditions including hypertension-hypothyroidism-GERD-history of osteoarthritic pain.  Chronic venous stasis- HPI:  Pt is a 83 y.o. female seen today for medical management of chronic diseases. She continues to be fairly remarkably stable despite her advanced age.  She doesn't really have any complaints today she does have a history of venous stasis wounds most prominently over right leg these are currently managed by wound care and thought to be stable she does have some covering of her lower right leg.  Brother medical issues appear to be stable she does have a history of hypertension but she is not on any medication Norvasc was DC'd previously secondary to some lower blood pressures.  She several months ago did have an episode of unresponsiveness is thought possibly something neurologic or cardiac  but this was quite transitory.  There been no further instances.  Currently she is sitting in her wheelchair comfortably vital signs appear to be stable she has no complaints area  Her weight appears to be stable 144 pounds     Past Medical History:  Diagnosis Date  . Back pain   . Confusion   . DNR (do not resuscitate)   . GERD (gastroesophageal reflux disease)   . Glaucoma   . HOH (hard of hearing)   . Pelvic fracture (HCC)    snf  . Thyroid disease    Past Surgical History:  Procedure Laterality Date  . ABDOMINAL SURGERY    . CATARACT EXTRACTION    . HEMORRHOID SURGERY      Allergies  Allergen Reactions  . Latex Itching  . Onion Nausea And Vomiting  . Other     SPANDEX ALLERGY  . Penicillins Other (See Comments)    UNKNOWN REACTION  . Sulfa Antibiotics Other (See Comments)    UNKNOWN REACTION    Allergies as of 04/07/2017      Reactions   Latex Itching   Onion Nausea And Vomiting   Other    SPANDEX ALLERGY   Penicillins Other (See Comments)   UNKNOWN REACTION   Sulfa Antibiotics Other (See Comments)   UNKNOWN REACTION      Medication List       Accurate as of 04/07/17 12:56 PM. Always use your most recent med list.          aspirin 81 MG EC tablet Take 1 tablet (81 mg total) by mouth  daily.   calcitonin (salmon) 200 UNIT/ACT nasal spray Commonly known as:  MIACALCIN/FORTICAL Place 1 spray into alternate nostrils daily.   CALCIUM 600 + D PO Take 1 tablet by mouth every morning.   cetaphil cream Apply topical to legs lightly  twice a day   collagenase ointment Commonly known as:  SANTYL Apply 1 application topically daily.   gabapentin 100 MG capsule Commonly known as:  NEURONTIN Take 1 capsule (100 mg total) by mouth 2 (two) times daily.   ibuprofen 200 MG tablet Commonly known as:  ADVIL,MOTRIN Take 200 mg by mouth daily.   levothyroxine 75 MCG tablet Commonly known as:  SYNTHROID, LEVOTHROID Take 75 mcg by mouth daily before  breakfast.   polycarbophil 625 MG tablet Commonly known as:  FIBERCON Take 625 mg by mouth at bedtime.   polyethylene glycol packet Commonly known as:  MIRALAX / GLYCOLAX Take 17 g by mouth daily.   ranitidine 150 MG tablet Commonly known as:  ZANTAC Take 150 mg by mouth at bedtime.   timolol 0.5 % ophthalmic solution Commonly known as:  BETIMOL Place 1 drop into both eyes at bedtime.   traMADol 50 MG tablet Commonly known as:  ULTRAM Take one tablet by mouth every 8 hours as needed for moderate to severe pain unrelieved by ibuprofen   Vitamin D3 1000 units Caps Take 1 capsule by mouth every morning.       Review of Systems   This is limited secondary to patient being very hard of hearing but she does not have any complaints today nursing does not report any concerns  Immunization History  Administered Date(s) Administered  . DT 12/30/2010  . Influenza, High Dose Seasonal PF 02/05/2013, 12/10/2013, 10/05/2014  . Influenza-Unspecified 11/06/2009, 10/26/2010, 10/05/2014, 09/27/2016  . PPD Test 02/14/2015  . Pneumococcal Polysaccharide-23 10/26/2010  . Pneumococcal-Unspecified 12/30/1978, 10/10/2016  . Td 12/30/2010   Pertinent  Health Maintenance Due  Topic Date Due  . DEXA SCAN  05/22/2017 (Originally 04[birth year]14/1979)  . INFLUENZA VACCINE  07/30/2017  . PNA vac Low Risk Adult (2 of 2 - PCV13) 10/10/2017   No flowsheet data found. Functional Status Survey:    Vitals:   04/06/17 1250  BP: (!) 141/78  Pulse: 79  Resp: 20  Temp: 98 F (36.7 C)  TempSrc: Oral  SpO2: 98%  Weight: 143 lb (64.9 kg)  Height:  (1.651 m)  Recent blood pressures 141/78-112/61-131/69 Physical Exam Constitutional: Frail elderly female who is bright and alert pleasant smiling-sitting comfortably in her wheelchair Skin-she has numerous solar induced changes on her face and arms legs with crusting and scaling these appear relatively unchanged HENT:  Sclerae nonicteric clear visual  acuity appears grossly intact possibly a slight left eye proptosis Head: Normocephalic.  Mouth/Throat: Oropharynx is clear and moist.  Cardiovascular: Normal rate, regular rhythmand normal heart sounds.  Pulmonary/Chest: Effort normaland breath sounds normal. No respiratory distress. She has no wheezes. She has no rales.  Abdominal: Soft. Bowel sounds are normal. She exhibits no distension. There is no tenderness. There is no rebound.  Musculoskeletal: She exhibits edema. Right greater than left which is chronic  -right lower leg is wrapped-this is followed by wound care with a history of venous stasis wounds that per nursing are stable  Neurological: She is alert. No lateralizing findings her speech is clear  Psychiatric: She has a normal mood and affect. Her behavior is normal. Judgmentand thought contentnormal. --Pleasant smiling quite hard of hearing but as usual appreciative of  our visit Labs reviewed:  Recent Labs  10/29/16 0500 11/07/16 0900 02/14/17 0700  NA 132* 135 134*  K 4.1 4.7 4.0  CL 97* 101 98*  CO2 GLUCOSE 93 121* 90  BUN CREATININE 0.86 0.94 0.74  CALCIUM 9.0 9.7 9.1    Recent Labs  04/08/16 0600 11/07/16 0900 02/14/17 0700  AST 15 23 14*  ALT 13* 14 10*  ALKPHOS 51 59 48  BILITOT 0.4 0.6 0.4  PROT 6.7 7.3 6.5  ALBUMIN 3.5 3.6 3.3*    Recent Labs  10/29/16 0500 11/07/16 0900 02/14/17 0700  WBC 5.6 4.9 4.8  NEUTROABS 3.2 2.7 2.1  HGB 13.2 14.8 13.2  HCT 38.9 44.3 39.8  MCV 89.8 89.9 89.4  PLT 342 224 325   Lab Results  Component Value Date   TSH 2.607 11/07/2016   Lab Results  Component Value Date   HGBA1C 5.8 (H) 01/30/2015   Lab Results  Component Value Date   CHOL 105 01/30/2015   HDL 44 01/30/2015   LDLCALC 45 01/30/2015   TRIG 78 01/30/2015   CHOLHDL 2.4 01/30/2015    Significant Diagnostic Results in last 30 days:  No results found.  Assessment/Plan  #1 hypertension-this appears relatively well  controlled as noted above occasionally some slight systolic elevations but considering her comorbidities and advanced age would be quite conservative here.  She is not on any medications.  Thyroidism this appears stable on Synthroid TSH was 2.607 on lab done November 2017.  #3 history of chronic back pain with a history of thoracic vertebral and pelvic fracture this appears to have stabilized she is now receiving Neurontin she is also on ibuprofen once a day as well as tramadol every 8 hours as needed-at one point this was a significant issue but appears to have stabilized.  #4 history of chronic venous stasis wounds-as noted above this is followed by wound care thought to be stable.  #5 history of GERD this is stable on Zantac.  #6 history of unresponsive episodes this has not occurred in some time again unknown etiology but she quickly returns to baseline  [id number]

## 2017-06-03 ENCOUNTER — Non-Acute Institutional Stay (SKILLED_NURSING_FACILITY): Payer: 59 | Admitting: Internal Medicine

## 2017-06-03 ENCOUNTER — Encounter: Payer: Self-pay | Admitting: Internal Medicine

## 2017-06-03 DIAGNOSIS — M810 Age-related osteoporosis without current pathological fracture: Secondary | ICD-10-CM

## 2017-06-03 DIAGNOSIS — K219 Gastro-esophageal reflux disease without esophagitis: Secondary | ICD-10-CM | POA: Diagnosis not present

## 2017-06-03 DIAGNOSIS — I10 Essential (primary) hypertension: Secondary | ICD-10-CM | POA: Diagnosis not present

## 2017-06-03 DIAGNOSIS — E038 Other specified hypothyroidism: Secondary | ICD-10-CM | POA: Diagnosis not present

## 2017-06-03 DIAGNOSIS — I872 Venous insufficiency (chronic) (peripheral): Secondary | ICD-10-CM | POA: Diagnosis not present

## 2017-06-03 NOTE — Progress Notes (Signed)
Location:   [facility] Room Number: 142/D Place of Service:  SNF (31) Provider:  Lenon CurtAnjali,Makynzi Eastland  Tina Lane, Tina BreechAnjali L, MD  Patient Care Team: Mahlon GammonGupta, Tina Rosas L, MD as PCP - General (Internal Medicine)  Extended Emergency Contact Information Primary Emergency Contact: Lane,Tina Address: [address]          [place], [place] Home Phone: [phone] Mobile Phone: [phone] Relation: Son  Code Status:  DNR Goals of care: Advanced Directive information Advanced Directives 06/03/2017  Does Patient Have a Medical Advance Directive? Yes  Type of Advance Directive Out of facility DNR (pink MOST or yellow form)  Does patient want to make changes to medical advance directive? No - Patient declined  Copy of Healthcare Power of Attorney in Chart? -  Would patient like information on creating a medical advance directive? -  Pre-existing out of facility DNR order (yellow form or pink MOST form) -     Chief Complaint  Patient presents with  . Medical Management of Chronic Issues    Routine Visit    HPI:  Pt is a [age] y.o. female seen today for medical management of chronic diseases.   . Patient has h/o Hypertension, Hypothyroidism, Vit D deficiency and GERD.  Patient has been doing well in Facility. Her weight is Slightly down to 140lbs. She denied any new problems. She has been stable since last routine visit. Her appetite is good and has no new nursing issues.  Past Medical History:  Diagnosis Date  . Back pain   . Confusion   . DNR (do not resuscitate)   . GERD (gastroesophageal reflux disease)   . Glaucoma   . HOH (hard of hearing)   . Pelvic fracture (HCC)    snf  . Thyroid disease    Past Surgical History:  Procedure Laterality Date  . ABDOMINAL SURGERY    . CATARACT EXTRACTION    . HEMORRHOID SURGERY      Allergies  Allergen Reactions  . Latex Itching  . Onion Nausea And Vomiting  . Other     SPANDEX  ALLERGY  . Penicillins Other (See Comments)    UNKNOWN REACTION  . Sulfa Antibiotics Other (See Comments)    UNKNOWN REACTION    Outpatient Encounter Prescriptions as of 06/03/2017  Medication Sig  . acetaminophen (TYLENOL) 500 MG tablet Take 500 mg by mouth daily.  Marland Kitchen. aspirin EC 81 MG EC tablet Take 1 tablet (81 mg total) by mouth daily.  . calcitonin, salmon, (MIACALCIN/FORTICAL) 200 UNIT/ACT nasal spray Place 1 spray into alternate nostrils daily.   . Calcium Carbonate-Vitamin D (CALCIUM 600 + D PO) Take 1 tablet by mouth every morning.   . cetaphil (CETAPHIL) cream Apply topical to legs lightly  twice a day  . Cholecalciferol (VITAMIN D3) 1000 UNITS CAPS Take 1 capsule by mouth every morning.   . gabapentin (NEURONTIN) 100 MG capsule Take 1 capsule (100 mg total) by mouth 2 (two) times daily.  Marland Kitchen. levothyroxine (SYNTHROID, LEVOTHROID) 75 MCG tablet Take 75 mcg by mouth daily before breakfast.  . polycarbophil (FIBERCON) 625 MG tablet Take 625 mg by mouth at bedtime.  . polyethylene glycol (MIRALAX / GLYCOLAX) packet Take 17 g by mouth daily.   . ranitidine (ZANTAC) 150 MG tablet Take 150 mg by mouth at bedtime.  . timolol (BETIMOL) 0.5 % ophthalmic solution Place 1 drop into both eyes at bedtime.  . traMADol (ULTRAM) 50 MG tablet Take one tablet  by mouth every 8 hours as needed for moderate to severe pain unrelieved by ibuprofen  . [DISCONTINUED] collagenase (SANTYL) ointment Apply 1 application topically daily.  . [DISCONTINUED] ibuprofen (ADVIL,MOTRIN) 200 MG tablet Take 200 mg by mouth daily.   No facility-administered encounter medications on file as of 06/03/2017.     Review of Systems  Review of Systems  Constitutional: Negative for activity change, appetite change, chills, diaphoresis, fatigue and fever.  HENT: Negative for mouth sores, postnasal drip, rhinorrhea, sinus pain and sore throat.   Respiratory: Negative for apnea, cough, chest tightness, shortness of breath and  wheezing.   Cardiovascular: Negative for chest pain, palpitations and leg swelling.  Gastrointestinal: Negative for abdominal distention, abdominal pain, constipation, diarrhea, nausea and vomiting.  Genitourinary: Negative for dysuria and frequency.  Musculoskeletal: Negative for arthralgias, joint swelling and myalgias.  Skin: Negative for rash.  Neurological: Negative for dizziness, syncope, weakness, light-headedness and numbness.  Psychiatric/Behavioral: Negative for behavioral problems, confusion and sleep disturbance.     Immunization History  Administered Date(s) Administered  . DT 12/30/2010  . Influenza Split 11/06/2009, 10/26/2010  . Influenza, High Dose Seasonal PF 02/05/2013, 12/10/2013, 10/05/2014  . Influenza, Seasonal, Injecte, Preservative Fre 10/05/2014  . Influenza-Unspecified 11/06/2009, 10/26/2010, 02/05/2013, 12/10/2013, 10/05/2014, 09/27/2016  . PPD Test 02/14/2015  . Pneumococcal Polysaccharide-23 10/26/2010  . Pneumococcal-Unspecified 12/30/1978, 10/10/2016  . Td 12/30/2010   Pertinent  Health Maintenance Due  Topic Date Due  . DEXA SCAN  11/29/2017 (Originally 04[birth year]14/1979)  . INFLUENZA VACCINE  07/30/2017  . PNA vac Low Risk Adult (2 of 2 - PCV13) 10/10/2017   No flowsheet data found. Functional Status Survey:    Vitals:   06/03/17 1422  BP: 118/67  Pulse: 84  Resp: 20  Temp: 97.2 F (36.2 C)  SpO2: 94%   There is no height or weight on file to calculate BMI. Physical Exam   Constitutional: She appears well-developed and well-nourished.  HENT:  Head: Normocephalic.  Mouth/Throat: Oropharynx is clear and moist.  Eyes: Pupils are equal, round, and reactive to light.  Neck: Neck supple.  Cardiovascular: Normal rate, regular rhythm and normal heart sounds.   No murmur heard. Pulmonary/Chest: Effort normal and breath sounds normal. No respiratory distress. She has no wheezes.  Abdominal: Soft. Bowel sounds are normal. She exhibits no  distension. There is no tenderness. There is no rebound.  Musculoskeletal:  Mild Edema B/Lane  Neurological: She is alert.  Skin: Skin is warm.  Labs reviewed:  Recent Labs  10/29/16 0500 11/07/16 0900 02/14/17 0700  NA 132* 135 134*  K 4.1 4.7 4.0  CL 97* 101 98*  CO2 29 24 29   GLUCOSE 93 121* 90  BUN 18 17 20   CREATININE 0.86 0.94 0.74  CALCIUM 9.0 9.7 9.1    Recent Labs  11/07/16 0900 02/14/17 0700  AST 23 14*  ALT 14 10*  ALKPHOS 59 48  BILITOT 0.6 0.4  PROT 7.3 6.5  ALBUMIN 3.6 3.3*    Recent Labs  10/29/16 0500 11/07/16 0900 02/14/17 0700  WBC 5.6 4.9 4.8  NEUTROABS 3.2 2.7 2.1  HGB 13.2 14.8 13.2  HCT 38.9 44.3 39.8  MCV 89.8 89.9 89.4  PLT 342 224 325   Lab Results  Component Value Date   TSH 2.607 11/07/2016   Lab Results  Component Value Date   HGBA1C 5.8 (H) 01/30/2015   Lab Results  Component Value Date   CHOL 105 01/30/2015   HDL 44 01/30/2015   LDLCALC 45  01/30/2015   TRIG 78 01/30/2015   CHOLHDL 2.4 01/30/2015    Significant Diagnostic Results in last 30 days:  No results found.  Assessment/Plan  Hypertension Patient BP stable. Her Norvasc was stopped few months ago.  GERD. Stable on ranitidine.  Hypothyroidism TSH was normal in 11/17. Continue same dose. Will repeat TSH  Chronic back pain due to osteoporosis  Stable on Ultram and Neurontin and calcitonin spray   Chronic venous stasis  Ulcer healed doing well.  Episode of unresponsiveness No more episodes recently  Patient on aspirin.  Son Does not want to be aggressive. Will continue to provide support.  Family/ staff Communication:   Labs/tests ordered:  CMP, TSH,CBC

## 2017-06-04 ENCOUNTER — Encounter (HOSPITAL_COMMUNITY)
Admission: RE | Admit: 2017-06-04 | Discharge: 2017-06-04 | Disposition: A | Discharge status: Home / Self Care | Payer: 59 | Source: Skilled Nursing Facility | Attending: Internal Medicine | Admitting: Internal Medicine

## 2017-06-04 DIAGNOSIS — Z8781 Personal history of (healed) traumatic fracture: Secondary | ICD-10-CM | POA: Insufficient documentation

## 2017-06-04 DIAGNOSIS — M810 Age-related osteoporosis without current pathological fracture: Secondary | ICD-10-CM | POA: Insufficient documentation

## 2017-06-04 DIAGNOSIS — I639 Cerebral infarction, unspecified: Secondary | ICD-10-CM | POA: Diagnosis present

## 2017-06-04 DIAGNOSIS — I872 Venous insufficiency (chronic) (peripheral): Secondary | ICD-10-CM | POA: Diagnosis present

## 2017-06-04 DIAGNOSIS — M549 Dorsalgia, unspecified: Secondary | ICD-10-CM | POA: Insufficient documentation

## 2017-06-04 LAB — COMPREHENSIVE METABOLIC PANEL
ALT: 12 U/L — AB (ref 14–54)
AST: 14 U/L — ABNORMAL LOW (ref 15–41)
Albumin: 3.4 g/dL — ABNORMAL LOW (ref 3.5–5.0)
Alkaline Phosphatase: 50 U/L (ref 38–126)
Anion gap: 7 (ref 5–15)
BILIRUBIN TOTAL: 0.5 mg/dL (ref 0.3–1.2)
BUN: 18 mg/dL (ref 6–20)
CO2: 29 mmol/L (ref 22–32)
CREATININE: 0.77 mg/dL (ref 0.44–1.00)
Calcium: 9.3 mg/dL (ref 8.9–10.3)
Chloride: 96 mmol/L — ABNORMAL LOW (ref 101–111)
Glucose, Bld: 95 mg/dL (ref 65–99)
POTASSIUM: 4.1 mmol/L (ref 3.5–5.1)
Sodium: 132 mmol/L — ABNORMAL LOW (ref 135–145)
TOTAL PROTEIN: 6.6 g/dL (ref 6.5–8.1)

## 2017-06-04 LAB — CBC
HCT: 40.3 % (ref 36.0–46.0)
Hemoglobin: 13.2 g/dL (ref 12.0–15.0)
MCH: 29.2 pg (ref 26.0–34.0)
MCHC: 32.8 g/dL (ref 30.0–36.0)
MCV: 89.2 fL (ref 78.0–100.0)
PLATELETS: 315 10*3/uL (ref 150–400)
RBC: 4.52 MIL/uL (ref 3.87–5.11)
RDW: 14.4 % (ref 11.5–15.5)
WBC: 4.7 10*3/uL (ref 4.0–10.5)

## 2017-06-04 LAB — TSH: TSH: 0.986 u[IU]/mL (ref 0.350–4.500)

## 2017-06-26 ENCOUNTER — Non-Acute Institutional Stay (SKILLED_NURSING_FACILITY): Payer: 59 | Admitting: Internal Medicine

## 2017-06-26 ENCOUNTER — Encounter: Payer: Self-pay | Admitting: Internal Medicine

## 2017-06-26 DIAGNOSIS — K219 Gastro-esophageal reflux disease without esophagitis: Secondary | ICD-10-CM | POA: Diagnosis not present

## 2017-06-26 DIAGNOSIS — E038 Other specified hypothyroidism: Secondary | ICD-10-CM

## 2017-06-26 DIAGNOSIS — M5489 Other dorsalgia: Secondary | ICD-10-CM | POA: Diagnosis not present

## 2017-06-26 DIAGNOSIS — I10 Essential (primary) hypertension: Secondary | ICD-10-CM | POA: Diagnosis not present

## 2017-06-26 DIAGNOSIS — M810 Age-related osteoporosis without current pathological fracture: Secondary | ICD-10-CM | POA: Diagnosis not present

## 2017-06-26 NOTE — Progress Notes (Signed)
Location:   [facility] Room Number: 142/D Place of Service:  SNF [phone](31) Provider:  Sabino DickArlo Lassen  Gupta, [name], MD  Patient Care Team: Mahlon GammonGupta, [name], MD as PCP - General (Internal Medicine)  Extended Emergency Contact Information Primary Emergency Contact: Gelles,Nathan Address: [address]          [place], [place] Home Phone: [phone] Mobile Phone: [phone] Relation: Son  Code Status:  DNR Goals of care: Advanced Directive information Advanced Directives 06/26/2017  Does Patient Have a Medical Advance Directive? Yes  Type of Advance Directive Out of facility DNR (pink MOST or yellow form)  Does patient want to make changes to medical advance directive? No - Patient declined  Copy of Healthcare Power of Attorney in Chart? -  Would patient like information on creating a medical advance directive? -  Pre-existing out of facility DNR order (yellow form or pink MOST form) -     Chief Complaint  Patient presents with  . Medical Management of Chronic Issues    Routine Visit  Medical management of chronic medical issues including hypertension-hypothyroidism-GERD-chronic back pain with osteoporosis-venous stasis changes-vitamin D deficiency  HPI:  Pt is a 83 y.o. female seen today for medical management of chronic medical issues as noted above-she continues to be quite stable her weight is relatively stable at around 140 pounds couple months ago it was 144  She appears to be eating and drinking fairly well-she is sitting in her wheelchair today continues to be very pleasant and in good spirits-she is very hard of hearing.  Months ago she did have an episode of unresponsiveness thought possibly to be cardiac versus neurologic there are been no further episodes she has been under baseline now for a considerable amount of time.  She also does have some history of venous stasis with wounds in the past but this appears  to have stabilized as well I do not note any erythema or concerning changes of her legs other than venous stasis.  She does have a history hypothyroidism on Synthroid TSH was 0.986 on lab done earlier this month.  She also has a history of hypertension she is not currently on any medication Norvasc was discontinued sometime ago nonetheless blood pressures appear to be stable most recently 130/68-130/70.  In regards to chronic back pain and osteoporosis this was an issue earlier but appears to have stabilized she is on calcium supplementation she is also on tramadol as well a For pain and this appears to be helping significantly.  In regards to vitamin D deficiency she is on supplementation--vitamin D level February was adequate at 50.3   Past Medical History:  Diagnosis Date  . Back pain   . Confusion   . DNR (do not resuscitate)   . GERD (gastroesophageal reflux disease)   . Glaucoma   . HOH (hard of hearing)   . Pelvic fracture (HCC)    snf  . Thyroid disease    Past Surgical History:  Procedure Laterality Date  . ABDOMINAL SURGERY    . CATARACT EXTRACTION    . HEMORRHOID SURGERY      Allergies  Allergen Reactions  . Latex Itching  . Onion Nausea And Vomiting  . Other     SPANDEX ALLERGY  . Penicillins Other (See Comments)    UNKNOWN REACTION  . Sulfa Antibiotics Other (See Comments)    UNKNOWN REACTION    Outpatient Encounter Prescriptions as of 06/26/2017  Medication Sig  . acetaminophen (TYLENOL) 500 MG tablet Take 500 mg by mouth daily.  Marland Kitchen aspirin EC 81 MG EC tablet Take 1 tablet (81 mg total) by mouth daily.  . calcitonin, salmon, (MIACALCIN/FORTICAL) 200 UNIT/ACT nasal spray Place 1 spray into alternate nostrils daily.   . Calcium Carbonate-Vitamin D (CALCIUM 600 + D PO) Take 1 tablet by mouth every morning.   . cetaphil (CETAPHIL) cream Apply topical to legs lightly  twice a day  . Cholecalciferol (VITAMIN D3) 1000 UNITS CAPS Take 1 capsule by mouth every  morning.   . gabapentin (NEURONTIN) 100 MG capsule Take 1 capsule (100 mg total) by mouth 2 (two) times daily.  Marland Kitchen levothyroxine (SYNTHROID, LEVOTHROID) 75 MCG tablet Take 75 mcg by mouth daily before breakfast.  . polycarbophil (FIBERCON) 625 MG tablet Take 625 mg by mouth at bedtime.  . polyethylene glycol (MIRALAX / GLYCOLAX) packet Take 17 g by mouth daily.   . ranitidine (ZANTAC) 150 MG tablet Take 150 mg by mouth at bedtime.  . timolol (BETIMOL) 0.5 % ophthalmic solution Place 1 drop into both eyes at bedtime.  . traMADol (ULTRAM) 50 MG tablet Take one tablet by mouth every 8 hours as needed for moderate to severe pain unrelieved by ibuprofen   No facility-administered encounter medications on file as of 06/26/2017.     Review of Systems  This is limited because patient is very hard of hearing also [facility] nursing staff.  General no complaints of fever chills.  Skin does have numerous solar induced changes appear relatively at baseline does not complaining of any itching or rashes.  Head ears eyes nose mouth and throat does not clear visual changes or difficulty swallowing.  Respiratory denies cough or shortness of breath.  Cardiac does not clinic chest pain has chronic venous stasis changes edema.  GI is not complaining of any abdominal discomfort nausea vomiting diarrhea constipation.  GU does not complain of dysuria.  Muscle skeletal at one point had significant back pain but this appears stable on current medications.  Neurologic is not complaining of dizziness headache or syncope.   Psych continues to be very pleasant no behaviors have been noted by staff a very cheerful person     Physical exam.  Temperature is 97.1 pulse 84 respirations 18 blood pressure 130/61  In general this is a frail [place] female continues to be very pleasant alert sitting comfortably in her wheelchair.  Her skin is warm and dry she has numerous solar induced changes on her face arms and  legs with crusting scaling this appears relatively baseline.  Oropharynx is clear mucous membranes moist.  Eyes visual acuity appears grossly intact.  Chest is clear to auscultation with somewhat shallow air entry there is no labored breathing.  Heart is regular rate and rhythm without murmur gallop or rub she has baseline lower extremity edema although this appears somewhat improved from when I last saw her she does have venous stasis changes bilaterally lower legs.  Abdomen is soft nontender with positive bowel sounds.  Musculoskeletal moves all extremities 4 ambulates wheelchair has lower extremity weakness but this is not new  Neurologic continues to be alert I don't see any lateralizing findings her speech is clear.  Psych she appears largely alert and oriented somewhat difficult to fully tell since she is very hard of hearing her behavior is normal is pleasant smiling  Immunization History  Administered Date(s) Administered  . DT 12/30/2010  . Influenza Split 11/06/2009, 10/26/2010  .  Influenza, High Dose Seasonal PF 02/05/2013, 12/10/2013, 10/05/2014  . Influenza, Seasonal, Injecte, Preservative Fre 10/05/2014  . Influenza-Unspecified 11/06/2009, 10/26/2010, 02/05/2013, 12/10/2013, 10/05/2014, 09/27/2016  . PPD Test 02/14/2015  . Pneumococcal Polysaccharide-23 10/26/2010  . Pneumococcal-Unspecified 12/30/1978, 10/10/2016  . Td 12/30/2010   Pertinent  Health Maintenance Due  Topic Date Due  . DEXA SCAN  11/29/2017 (Originally 04/14/1979)  . INFLUENZA VACCINE  07/30/2017  . PNA vac Low Risk Adult (2 of 2 - PCV13) 10/10/2017   No flowsheet data found. Functional Status Survey:    Vitals:   06/26/17 1357  BP: 130/68  Pulse: 84  Resp: 18  Temp: 97.1 F (36.2 C)  TempSrc: Oral  Weight: 140 lb (63.5 kg)  Height: 5\' 5"  (1.651 m)   Body mass index is 23.3 kg/m. Physical Exam As noted above Labs reviewed:  Recent Labs  11/07/16 0900 02/14/17 0700 06/04/17 0707   NA 135 134* 132*  K 4.7 4.0 4.1  CL 101 98* 96*  CO2 24 29 29   GLUCOSE 121* 90 95  BUN 17 20 18   CREATININE 0.94 0.74 0.77  CALCIUM 9.7 9.1 9.3    Recent Labs  11/07/16 0900 02/14/17 0700 06/04/17 0707  AST 23 14* 14*  ALT 14 10* 12*  ALKPHOS 59 48 50  BILITOT 0.6 0.4 0.5  PROT 7.3 6.5 6.6  ALBUMIN 3.6 3.3* 3.4*    Recent Labs  10/29/16 0500 11/07/16 0900 02/14/17 0700 06/04/17 0707  WBC 5.6 4.9 4.8 4.7  NEUTROABS 3.2 2.7 2.1  --   HGB 13.2 14.8 13.2 13.2  HCT 38.9 44.3 39.8 40.3  MCV 89.8 89.9 89.4 89.2  PLT 342 224 325 315   Lab Results  Component Value Date   TSH 0.986 06/04/2017   Lab Results  Component Value Date   HGBA1C 5.8 (H) 01/30/2015   Lab Results  Component Value Date   CHOL 105 01/30/2015   HDL 44 01/30/2015   LDLCALC 45 01/30/2015   TRIG 78 01/30/2015   CHOLHDL 2.4 01/30/2015    Significant Diagnostic Results in last 30 days:  No results found.  Assessment/Plan  #1 hypertension this appears stable currently on no medications.  #2 hypothyroidism TSH earlier this month 0.986 will monitor periodically she is on Synthroid.  #3 history chronic back pain osteoporosis continues on calcium she is also on Neurontin for pain as well as tramadol this appears to be effective.  #4-history of vitamin D deficiency she is on supplementation again level was adequate earlier this year will monitor periodically.  #5 history of GERD continues on ranitidine this appears to be stable.  #6 history of venous stasis changes this appears stable as well does have some history of wounds in the past but this appears stabilized recently.  #7 history of mild hyponatremia this is been relatively baseline at 132 will monitor periodically  #8 history of unresponsive episode again there been no further episodes she is on aspirin-son  does not desire aggressive workup which is certainly understandable.  [id number]

## 2017-07-22 ENCOUNTER — Encounter: Payer: Self-pay | Admitting: Internal Medicine

## 2017-07-22 ENCOUNTER — Non-Acute Institutional Stay (SKILLED_NURSING_FACILITY): Payer: 59 | Admitting: Internal Medicine

## 2017-07-22 DIAGNOSIS — K219 Gastro-esophageal reflux disease without esophagitis: Secondary | ICD-10-CM

## 2017-07-22 DIAGNOSIS — I10 Essential (primary) hypertension: Secondary | ICD-10-CM | POA: Diagnosis not present

## 2017-07-22 DIAGNOSIS — E038 Other specified hypothyroidism: Secondary | ICD-10-CM

## 2017-07-22 DIAGNOSIS — I872 Venous insufficiency (chronic) (peripheral): Secondary | ICD-10-CM | POA: Diagnosis not present

## 2017-07-22 NOTE — Progress Notes (Signed)
Location:    [facility] Room Number: 142/D Place of Service:  SNF (31) Provider:  Lenon Curt, Freddie Breech, MD  Patient Care Team: Mahlon Gammon, MD as PCP - General (Internal Medicine)  Extended Emergency Contact Information Primary Emergency Contact: Parsley,Nathan Address: [address]          [place], [place] Home Phone: [phone] Mobile Phone: [phone] Relation: Son  Code Status: DNR   Goals of care: Advanced Directive information Advanced Directives 07/22/2017  Does Patient Have a Medical Advance Directive? Yes  Type of Advance Directive Out of facility DNR (pink MOST or yellow form)  Does patient want to make changes to medical advance directive? No - Patient declined  Copy of Healthcare Power of Attorney in Chart? -  Would patient like information on creating a medical advance directive? -  Pre-existing out of facility DNR order (yellow form or pink MOST form) -     Chief Complaint  Patient presents with  . Medical Management of Chronic Issues    Routine Visit    HPI:  Pt is a 83 y.o. female seen today for medical management of chronic diseases.   Patient has h/o Hypertension, Hypothyroidism, Vit D deficiency and GERD.and Chronic back pain.  Patient is doing well in facility. Has had no New Nursing issues. Today she was able to walk with the walker and support. She is very pleasant. And says she can't see and hear good but that is because' I am old'. Her weight stays stable b/w 140-145 lbs. Her appetite os good. Her back pain per patient is there but Bearable.    Past Medical History:  Diagnosis Date  . Back pain   . Confusion   . DNR (do not resuscitate)   . GERD (gastroesophageal reflux disease)   . Glaucoma   . HOH (hard of hearing)   . Pelvic fracture (HCC)    snf  . Thyroid disease    Past Surgical History:  Procedure Laterality Date  . ABDOMINAL SURGERY    . CATARACT  EXTRACTION    . HEMORRHOID SURGERY      Allergies  Allergen Reactions  . Latex Itching  . Onion Nausea And Vomiting  . Other     SPANDEX ALLERGY  . Penicillins Other (See Comments)    UNKNOWN REACTION  . Sulfa Antibiotics Other (See Comments)    UNKNOWN REACTION    Outpatient Encounter Prescriptions as of 07/22/2017  Medication Sig  . acetaminophen (TYLENOL) 500 MG tablet Take 500 mg by mouth daily.  Marland Kitchen aspirin EC 81 MG EC tablet Take 1 tablet (81 mg total) by mouth daily.  . calcitonin, salmon, (MIACALCIN/FORTICAL) 200 UNIT/ACT nasal spray Place 1 spray into alternate nostrils daily.   . Calcium Carbonate-Vitamin D (CALCIUM 600 + D PO) Take 1 tablet by mouth every morning.   . cetaphil (CETAPHIL) cream Apply topical to legs lightly  twice a day  . Cholecalciferol (VITAMIN D3) 1000 UNITS CAPS Take 1 capsule by mouth every morning.   . gabapentin (NEURONTIN) 100 MG capsule Take 1 capsule (100 mg total) by mouth 2 (two) times daily.  Marland Kitchen levothyroxine (SYNTHROID, LEVOTHROID) 75 MCG tablet Take 75 mcg by mouth daily before breakfast.  . polycarbophil (FIBERCON) 625 MG tablet Take 625 mg by mouth at bedtime.  . polyethylene glycol (MIRALAX / GLYCOLAX) packet Take 17 g by mouth daily.   . ranitidine (ZANTAC) 150 MG tablet Take 150  mg by mouth at bedtime.  . timolol (BETIMOL) 0.5 % ophthalmic solution Place 1 drop into both eyes at bedtime.  . traMADol (ULTRAM) 50 MG tablet Take one tablet by mouth every 8 hours as needed for moderate to severe pain unrelieved by ibuprofen   No facility-administered encounter medications on file as of 07/22/2017.      Review of Systems  Review of Systems  Constitutional: Negative for activity change, appetite change, chills, diaphoresis, fatigue and fever.  HENT: Negative for mouth sores, postnasal drip, rhinorrhea, sinus pain and sore throat.   Respiratory: Negative for apnea, cough, chest tightness, shortness of breath and wheezing.   Cardiovascular:  Negative for chest pain, palpitations and leg swelling.  Gastrointestinal: Negative for abdominal distention, abdominal pain, constipation, diarrhea, nausea and vomiting.  Genitourinary: Negative for dysuria and frequency.  Musculoskeletal: Negative for arthralgias, joint swelling and myalgias.  Skin: Negative for rash.  Neurological: Negative for dizziness, syncope, weakness, light-headedness and numbness.  Psychiatric/Behavioral: Negative for behavioral problems, confusion and sleep disturbance.     Immunization History  Administered Date(s) Administered  . DT 12/30/2010  . Influenza Split 11/06/2009, 10/26/2010  . Influenza, High Dose Seasonal PF 02/05/2013, 12/10/2013, 10/05/2014  . Influenza, Seasonal, Injecte, Preservative Fre 10/05/2014  . Influenza-Unspecified 11/06/2009, 10/26/2010, 02/05/2013, 12/10/2013, 10/05/2014, 09/27/2016  . PPD Test 02/14/2015  . Pneumococcal Polysaccharide-23 10/26/2010  . Pneumococcal-Unspecified 12/30/1978, 10/10/2016  . Td 12/30/2010   Pertinent  Health Maintenance Due  Topic Date Due  . DEXA SCAN  11/29/2017 (Originally 04/14/1979)  . INFLUENZA VACCINE  07/30/2017  . PNA vac Low Risk Adult (2 of 2 - PCV13) 10/10/2017   No flowsheet data found. Functional Status Survey:    Vitals:   07/22/17 1023  BP: 130/72  Pulse: 86  Resp: 20  Temp: 98.2 F (36.8 C)  TempSrc: Oral  SpO2: 96%  Weight: 142 lb 6.4 oz (64.6 kg)  Height: 5\' 5"  (1.651 m)   Body mass index is 23.7 kg/m. Physical Exam   Constitutional: She appears well-developedand well-nourished.  HENT:  Head: Normocephalic.  Mouth/Throat: Oropharynx is clear and moist.  Eyes: Pupils are equal, round, and reactive to light.  Neck: Neck supple.  Cardiovascular: Normal rate, regular rhythmand normal heart sounds.  No murmurheard. Pulmonary/Chest: Effort normaland breath sounds normal. Did have some rales in both Bases  Abdominal: Soft. Bowel sounds are normal. She exhibits  no distension. There is no tenderness. There is no rebound.  Musculoskeletal:  Mild Edema B/L Has chronic venous stasis changes in both LE., Neurological: She is alert.  Skin: Skin is warm.  Labs reviewed:  Recent Labs  11/07/16 0900 02/14/17 0700 06/04/17 0707  NA 135 134* 132*  K 4.7 4.0 4.1  CL 101 98* 96*  CO2 24 29 29   GLUCOSE 121* 90 95  BUN 17 20 18   CREATININE 0.94 0.74 0.77  CALCIUM 9.7 9.1 9.3    Recent Labs  11/07/16 0900 02/14/17 0700 06/04/17 0707  AST 23 14* 14*  ALT 14 10* 12*  ALKPHOS 59 48 50  BILITOT 0.6 0.4 0.5  PROT 7.3 6.5 6.6  ALBUMIN 3.6 3.3* 3.4*    Recent Labs  10/29/16 0500 11/07/16 0900 02/14/17 0700 06/04/17 0707  WBC 5.6 4.9 4.8 4.7  NEUTROABS 3.2 2.7 2.1  --   HGB 13.2 14.8 13.2 13.2  HCT 38.9 44.3 39.8 40.3  MCV 89.8 89.9 89.4 89.2  PLT 342 224 325 315   Lab Results  Component Value Date  TSH 0.986 06/04/2017   Lab Results  Component Value Date   HGBA1C 5.8 (H) 01/30/2015   Lab Results  Component Value Date   CHOL 105 01/30/2015   HDL 44 01/30/2015   LDLCALC 45 01/30/2015   TRIG 78 01/30/2015   CHOLHDL 2.4 01/30/2015    Significant Diagnostic Results in last 30 days:  No results found.  Assessment/Plan  Hypertension Patient BP stable.  HerNorvasc was stopped few months ago due to episode of Syncope  GERD. Stable on ranitidine.  Hypothyroidism TSH was normal in 06/18 Continue same dose.   Chronic back pain due to osteoporosis  Stable on Ultram and Neurontin and calcitonin spray  Mild Hyponatremia Will repeat BMP. Chronic venous stasis  Ulcer healed doing well.  Episode of unresponsiveness No more episodes recently  Patient on aspirin. Son Doesnot want to be aggressive.Will continue to provide support.   Family/ staff Communication:   Labs/tests ordered:  BMP  Total time spent in this patient care encounter was 25_ minutes; greater than 50% of the visit spent counseling  patient, reviewing records , Labs and coordinating care for problems addressed at this encounter.

## 2017-07-23 ENCOUNTER — Encounter (HOSPITAL_COMMUNITY)
Admission: RE | Admit: 2017-07-23 | Discharge: 2017-07-23 | Disposition: A | Discharge status: Home / Self Care | Payer: 59 | Source: Skilled Nursing Facility | Attending: Internal Medicine | Admitting: Internal Medicine

## 2017-07-23 DIAGNOSIS — E039 Hypothyroidism, unspecified: Secondary | ICD-10-CM | POA: Insufficient documentation

## 2017-07-23 DIAGNOSIS — I639 Cerebral infarction, unspecified: Secondary | ICD-10-CM | POA: Insufficient documentation

## 2017-07-23 DIAGNOSIS — M549 Dorsalgia, unspecified: Secondary | ICD-10-CM | POA: Insufficient documentation

## 2017-07-23 DIAGNOSIS — Z8781 Personal history of (healed) traumatic fracture: Secondary | ICD-10-CM | POA: Insufficient documentation

## 2017-07-23 DIAGNOSIS — M810 Age-related osteoporosis without current pathological fracture: Secondary | ICD-10-CM | POA: Diagnosis present

## 2017-07-23 DIAGNOSIS — I872 Venous insufficiency (chronic) (peripheral): Secondary | ICD-10-CM | POA: Insufficient documentation

## 2017-07-23 LAB — BASIC METABOLIC PANEL
ANION GAP: 7 (ref 5–15)
BUN: 19 mg/dL (ref 6–20)
CHLORIDE: 94 mmol/L — AB (ref 101–111)
CO2: 31 mmol/L (ref 22–32)
Calcium: 9.3 mg/dL (ref 8.9–10.3)
Creatinine, Ser: 0.84 mg/dL (ref 0.44–1.00)
GFR calc non Af Amer: 54 mL/min — ABNORMAL LOW (ref >60)
GLUCOSE: 92 mg/dL (ref 65–99)
Potassium: 4.2 mmol/L (ref 3.5–5.1)
Sodium: 132 mmol/L — ABNORMAL LOW (ref 135–145)

## 2017-08-08 ENCOUNTER — Encounter (HOSPITAL_COMMUNITY)
Admission: AD | Admit: 2017-08-08 | Discharge: 2017-08-08 | Disposition: A | Discharge status: Home / Self Care | Payer: 59 | Source: Skilled Nursing Facility | Attending: Internal Medicine | Admitting: Internal Medicine

## 2017-08-08 ENCOUNTER — Encounter: Payer: Self-pay | Admitting: Internal Medicine

## 2017-08-08 ENCOUNTER — Non-Acute Institutional Stay (SKILLED_NURSING_FACILITY): Payer: 59 | Admitting: Internal Medicine

## 2017-08-08 DIAGNOSIS — L03213 Periorbital cellulitis: Secondary | ICD-10-CM | POA: Diagnosis not present

## 2017-08-08 DIAGNOSIS — R69 Illness, unspecified: Secondary | ICD-10-CM | POA: Insufficient documentation

## 2017-08-08 DIAGNOSIS — H109 Unspecified conjunctivitis: Secondary | ICD-10-CM

## 2017-08-08 NOTE — Progress Notes (Signed)
Location:   [facility] Room Number: 142/D Place of Service:  SNF [phone]) Provider:  Sabino Dick, MD  Patient Care Team: Mahlon Gammon, MD as PCP - General (Internal Medicine)  Extended Emergency Contact Information Primary Emergency Contact: Coulston,Nathan Address: [address]          [place], [place] Home Phone: [phone] Mobile Phone: [phone] Relation: Son  Code Status:  DNR Goals of care: Advanced Directive information Advanced Directives 08/08/2017  Does Patient Have a Medical Advance Directive? Yes  Type of Advance Directive Out of facility DNR (pink MOST or yellow form)  Does patient want to make changes to medical advance directive? No - Patient declined  Copy of Healthcare Power of Attorney in Chart? -  Would patient like information on creating a medical advance directive? -  Pre-existing out of facility DNR order (yellow form or pink MOST form) -     Chief Complaint  Patient presents with  . Acute Visit    Conjunctivitis    HPI:  Pt is a 83 y.o. female seen today for an acute visit forConcerns of an erythematous left eye with some erythema around the orbital area.  Patient is not really complaining of any visual changes or acute discomfort.  Vital signs are stable she is afebrile   Patient has h/o Hypertension, Hypothyroidism, Vit D deficiency and GERD.and Chronic back pain.  She continues to do quite well especially considering her advanced age-   Past Medical History:  Diagnosis Date  . Back pain   . Confusion   . DNR (do not resuscitate)   . GERD (gastroesophageal reflux disease)   . Glaucoma   . HOH (hard of hearing)   . Pelvic fracture (HCC)    snf  . Thyroid disease    Past Surgical History:  Procedure Laterality Date  . ABDOMINAL SURGERY    . CATARACT EXTRACTION    . HEMORRHOID SURGERY      Allergies  Allergen Reactions  . Latex Itching  . Onion  Nausea And Vomiting  . Other     SPANDEX ALLERGY  . Penicillins Other (See Comments)    UNKNOWN REACTION  . Sulfa Antibiotics Other (See Comments)    UNKNOWN REACTION    Outpatient Encounter Prescriptions as of 08/08/2017  Medication Sig  . acetaminophen (TYLENOL) 500 MG tablet Take 500 mg by mouth daily.  Marland Kitchen aspirin EC 81 MG EC tablet Take 1 tablet (81 mg total) by mouth daily.  . calcitonin, salmon, (MIACALCIN/FORTICAL) 200 UNIT/ACT nasal spray Place 1 spray into alternate nostrils daily.   . Calcium Carbonate-Vitamin D (CALCIUM 600 + D PO) Take 1 tablet by mouth every morning.   . cetaphil (CETAPHIL) cream Apply topical to legs lightly  twice a day  . Cholecalciferol (VITAMIN D3) 1000 UNITS CAPS Take 1 capsule by mouth every morning.   . gabapentin (NEURONTIN) 100 MG capsule Take 1 capsule (100 mg total) by mouth 2 (two) times daily.  Marland Kitchen levothyroxine (SYNTHROID, LEVOTHROID) 75 MCG tablet Take 75 mcg by mouth daily before breakfast.  . polycarbophil (FIBERCON) 625 MG tablet Take 625 mg by mouth at bedtime.  . polyethylene glycol (MIRALAX / GLYCOLAX) packet Take 17 g by mouth daily.   . ranitidine (ZANTAC) 150 MG tablet Take 150 mg by mouth at bedtime.  . timolol (BETIMOL) 0.5 % ophthalmic solution Place 1 drop into both eyes at bedtime.  . traMADol Janean Sark)  50 MG tablet Take one tablet by mouth every 8 hours as needed for moderate to severe pain unrelieved by ibuprofen   No facility-administered encounter medications on file as of 08/08/2017.     Review of Systems   This is limited secondary to patient being very hard of hearing-per nursing she is not complaining of any discomfort or acute visual changes appears to be fairly well she has done quite well with supportive care nursing does not report any issues other than the inflamed appearing right eye  And   orbital area  Immunization History  Administered Date(s) Administered  . DT 12/30/2010  . Influenza Split 11/06/2009,  10/26/2010  . Influenza, High Dose Seasonal PF 02/05/2013, 12/10/2013, 10/05/2014  . Influenza, Seasonal, Injecte, Preservative Fre 10/05/2014  . Influenza-Unspecified 11/06/2009, 10/26/2010, 02/05/2013, 12/10/2013, 10/05/2014, 09/27/2016  . PPD Test 02/14/2015  . Pneumococcal Polysaccharide-23 10/26/2010  . Pneumococcal-Unspecified 12/30/1978, 10/10/2016  . Td 12/30/2010   Pertinent  Health Maintenance Due  Topic Date Due  . INFLUENZA VACCINE  11/29/2017 (Originally 07/30/2017)  . DEXA SCAN  11/29/2017 (Originally 04/14/1979)  . PNA vac Low Risk Adult (2 of 2 - PCV13) 10/10/2017   No flowsheet data found. Functional Status Survey:    Vitals:   08/08/17 1326  BP: 138/68  Pulse: 81  Resp: 18  Temp: 98.5 F (36.9 C)  TempSrc: Oral  SpO2: 96%    Physical Exam  In general this is a frail [place] female continues to be very pleasant alert sitting comfortably in her wheelchair.  Her skin is warm and dry she has numerous solar induced changes on her face arms and legs with crusting scaling this appears relatively baseline.  Oropharynx is clear mucous membranes moist.  Eyes conjunctiva appear to be clear of the right eye visual acuity type-left eye does have some erythema of the conjunctivae possibly some mild drainage on the lower eyelid but this is quite minimal at this point she also appears to have some mild erythema around the left eye in the orbital area   Pupils do appear reactive bilaterally and visual acuity appears to be intact  Chest is clear to auscultation with somewhat shallow air entry there is no labored breathing.  Heart is regular rate and rhythm without murmur gallop or rub she has fairly minimal lower extremity edema this appears improved from exams several months ago-she does have venous stasis changes   Abdomen is soft nontender with positive bowel sounds.  Musculoskeletal moves all extremities 4 ambulates wheelchair has lower extremity weakness but  this is not new  Neurologic continues to be alert with no overt lateralizing findings her speech is clear.  Psych she appears largely alert and oriented somewhat difficult to fully tell since she is very hard of hearing her behavior is normal is pleasant smiling which is her baseline     Immunization History     Labs reviewed:  Recent Labs  02/14/17 0700 06/04/17 0707 07/23/17 0747  NA 134* 132* 132*  K 4.0 4.1 4.2  CL 98* 96* 94*  CO2 29 29 31   GLUCOSE 90 95 92  BUN 20 18 19   CREATININE 0.74 0.77 0.84  CALCIUM 9.1 9.3 9.3    Recent Labs  11/07/16 0900 02/14/17 0700 06/04/17 0707  AST 23 14* 14*  ALT 14 10* 12*  ALKPHOS 59 48 50  BILITOT 0.6 0.4 0.5  PROT 7.3 6.5 6.6  ALBUMIN 3.6 3.3* 3.4*    Recent Labs  10/29/16 0500 11/07/16 0900  02/14/17 0700 06/04/17 0707  WBC 5.6 4.9 4.8 4.7  NEUTROABS 3.2 2.7 2.1  --   HGB 13.2 14.8 13.2 13.2  HCT 38.9 44.3 39.8 40.3  MCV 89.8 89.9 89.4 89.2  PLT 342 224 325 315   Lab Results  Component Value Date   TSH 0.986 06/04/2017   Lab Results  Component Value Date   HGBA1C 5.8 (H) 01/30/2015   Lab Results  Component Value Date   CHOL 105 01/30/2015   HDL 44 01/30/2015   LDLCALC 45 01/30/2015   TRIG 78 01/30/2015   CHOLHDL 2.4 01/30/2015    Significant Diagnostic Results in last 30 days:  No results found.  Assessment/Plan  #1 conjunctivitis with possible mild left orbital cellulitis new problem--Will start doxycycline 100 mg twice a day for 7 days for the cellulitis-for conjunctivitis will start Cipro ophthalmologic solution 2 drops 3 times a day for 7 days.  Monitor for any changes and culture any increased exudate.  Clinically she appears stable but will have to be watched.  [id number]

## 2017-08-12 LAB — EYE CULTURE
Culture: NORMAL
Gram Stain: NONE SEEN

## 2017-08-27 ENCOUNTER — Encounter: Payer: Self-pay | Admitting: Internal Medicine

## 2017-08-27 ENCOUNTER — Non-Acute Institutional Stay (SKILLED_NURSING_FACILITY): Payer: 59 | Admitting: Internal Medicine

## 2017-08-27 DIAGNOSIS — M5489 Other dorsalgia: Secondary | ICD-10-CM

## 2017-08-27 DIAGNOSIS — M810 Age-related osteoporosis without current pathological fracture: Secondary | ICD-10-CM

## 2017-08-27 DIAGNOSIS — I872 Venous insufficiency (chronic) (peripheral): Secondary | ICD-10-CM | POA: Diagnosis not present

## 2017-08-27 DIAGNOSIS — E038 Other specified hypothyroidism: Secondary | ICD-10-CM | POA: Diagnosis not present

## 2017-08-27 DIAGNOSIS — K219 Gastro-esophageal reflux disease without esophagitis: Secondary | ICD-10-CM

## 2017-08-27 DIAGNOSIS — I10 Essential (primary) hypertension: Secondary | ICD-10-CM

## 2017-08-27 NOTE — Progress Notes (Signed)
Location:    [facility] Room Number: 142/D Place of Service:  SNF (31) Provider:  Sabino DickArlo,Slevin Gunby  Gupta, [name], MD  Patient Care Team: Mahlon GammonGupta, [name], MD as PCP - General (Internal Medicine)  Extended Emergency Contact Information Primary Emergency Contact: Truett,Nathan Address: [address]          [place], [place] Home Phone: [phone] Mobile Phone: [phone] Relation: Son  Code Status:  DNR Goals of care: Advanced Directive information Advanced Directives 08/27/2017  Does Patient Have a Medical Advance Directive? Yes  Type of Advance Directive Out of facility DNR (pink MOST or yellow form)  Does patient want to make changes to medical advance directive? No - Patient declined  Copy of Healthcare Power of Attorney in Chart? -  Would patient like information on creating a medical advance directive? -  Pre-existing out of facility DNR order (yellow form or pink MOST form) -     Chief Complaint  Patient presents with  . Medical Management of Chronic Issues    Routine Visit   Medical management of chronic medical conditions including chronic back pain with osteoporosis-hypertension-GERD-hypothyroidism-mild hyponatremia-   HPI:  Pt is a 83 y.o. female seen today for medical management of chronic diseases. As noted above.  Considering patient's advanced age she is doing remarkably well and relatively stable she was recently treated for conjunctivitis which appears to resolved unremarkably.  She also has a history of significant low back pain at this point appears controlled on tramadol Neurontin 8 calcitonin spray with history of osteoporosis.  She also has a history of hypertension but her Norvasc was discontinued after syncopal episode there been no recent episodes here this appears to be stable blood pressure appears to be stable most recently 105/63-130/68--.  Weight appears to be stable in the low 140s  currently 141.6.  Regards hypothyroidism she is on Synthroid TSH was within normal range on June 2018 lab at 0.986.  She also has mild chronic hyponatremia with sodium 132 on lab done on 07/23/2017 this is stable with previous level.  Currently she is resting in bed comfortably has no acute complaints nursing staff does not report any recent acute issues    Past Medical History:  Diagnosis Date  . Back pain   . Confusion   . DNR (do not resuscitate)   . GERD (gastroesophageal reflux disease)   . Glaucoma   . HOH (hard of hearing)   . Pelvic fracture (HCC)    snf  . Thyroid disease    Past Surgical History:  Procedure Laterality Date  . ABDOMINAL SURGERY    . CATARACT EXTRACTION    . HEMORRHOID SURGERY      Allergies  Allergen Reactions  . Latex Itching  . Onion Nausea And Vomiting  . Other     SPANDEX ALLERGY  . Penicillins Other (See Comments)    UNKNOWN REACTION  . Sulfa Antibiotics Other (See Comments)    UNKNOWN REACTION    Outpatient Encounter Prescriptions as of 08/27/2017  Medication Sig  . acetaminophen (TYLENOL) 500 MG tablet Take 500 mg by mouth daily.  Marland Kitchen. aspirin EC 81 MG EC tablet Take 1 tablet (81 mg total) by mouth daily.  . calcitonin, salmon, (MIACALCIN/FORTICAL) 200 UNIT/ACT nasal spray Place 1 spray into alternate nostrils daily.   . Calcium Carbonate-Vitamin D (CALCIUM 600 + D PO) Take 1 tablet by mouth every morning.   . cetaphil (CETAPHIL) cream Apply topical  to legs lightly  twice a day  . Cholecalciferol (VITAMIN D3) 1000 UNITS CAPS Take 1 capsule by mouth every morning.   . gabapentin (NEURONTIN) 100 MG capsule Take 1 capsule (100 mg total) by mouth 2 (two) times daily.  Marland Kitchen levothyroxine (SYNTHROID, LEVOTHROID) 75 MCG tablet Take 75 mcg by mouth daily before breakfast.  . polycarbophil (FIBERCON) 625 MG tablet Take 625 mg by mouth at bedtime.  . polyethylene glycol (MIRALAX / GLYCOLAX) packet Take 17 g by mouth daily.   . ranitidine (ZANTAC)  150 MG tablet Take 150 mg by mouth at bedtime.  . timolol (BETIMOL) 0.5 % ophthalmic solution Place 1 drop into both eyes at bedtime.  . traMADol (ULTRAM) 50 MG tablet Take one tablet by mouth every 8 hours as needed for moderate to severe pain unrelieved by ibuprofen   No facility-administered encounter medications on file as of 08/27/2017.      Review of Systems  This is limited since patient is very hard of hearing.  But is not complaining of any pain at this time or shortness of breath fever or chills does not complain of eye discomfort has been treated for conjunctivitis nursing staff does not report any recent acute concerns apparently her appetite is fairly steady weight is steady   Immunization History  Administered Date(s) Administered  . DT 12/30/2010  . Influenza Split 11/06/2009, 10/26/2010  . Influenza, High Dose Seasonal PF 02/05/2013, 12/10/2013, 10/05/2014  . Influenza, Seasonal, Injecte, Preservative Fre 10/05/2014  . Influenza-Unspecified 11/06/2009, 10/26/2010, 02/05/2013, 12/10/2013, 10/05/2014, 09/27/2016  . PPD Test 02/14/2015  . Pneumococcal Polysaccharide-23 10/26/2010  . Pneumococcal-Unspecified 12/30/1978, 10/10/2016  . Td 12/30/2010   Pertinent  Health Maintenance Due  Topic Date Due  . INFLUENZA VACCINE  11/29/2017 (Originally 07/30/2017)  . DEXA SCAN  11/29/2017 (Originally 04/14/1979)  . PNA vac Low Risk Adult (2 of 2 - PCV13) 10/10/2017   No flowsheet data found. Functional Status Survey:    Vitals:   08/27/17 1332  BP: 105/63  Pulse: 77  Resp: 20  Temp: 98.4 F (36.9 C)  TempSrc: Oral  SpO2: 96%  Weight: 141 lb (64 kg)  Height: 5\' 5"  (1.651 m)   Body mass index is 23.46 kg/m. Physical Exam   In general this is a somewhat frail elderly female in no distress resting comfortably in bed.  Her skin is warm and dry she does have numerous solar induced changes of her arms and legs and face.  She does have venous stasis changes of her lower  legs but I do not see signs of cellulitis at this point.  Eyes pupils appear reactive light sclera and conjunctivae are clear visual acuity appears grossly intact.  Oropharynx is clear mucous membranes moist.  Heart is regular rate and rhythm without murmur gallop or rub she really does not have much edema this evening -- her legs are elevated in bed which I suspect is helping  Abdomen is soft nontender with positive bowel sounds.  Musculoskeletal limited exam since he is in bed but is able to move all extremities 4 with lower extremity weakness which is not new.  Neurologic is grossly intact her speech is clear no lateralizing findings.  Psych difficult to fully assess secondary to being very hard of hearing but she can follow simple verbal commands without difficulty she is pleasant smiling and appreciative of the visit which is her baseline.    Labs reviewed:  Recent Labs  02/14/17 0700 06/04/17 0707 07/23/17 0747  NA  134* 132* 132*  K 4.0 4.1 4.2  CL 98* 96* 94*  CO2 29 29 31   GLUCOSE 90 95 92  BUN 20 18 19   CREATININE 0.74 0.77 0.84  CALCIUM 9.1 9.3 9.3    Recent Labs  11/07/16 0900 02/14/17 0700 06/04/17 0707  AST 23 14* 14*  ALT 14 10* 12*  ALKPHOS 59 48 50  BILITOT 0.6 0.4 0.5  PROT 7.3 6.5 6.6  ALBUMIN 3.6 3.3* 3.4*    Recent Labs  10/29/16 0500 11/07/16 0900 02/14/17 0700 06/04/17 0707  WBC 5.6 4.9 4.8 4.7  NEUTROABS 3.2 2.7 2.1  --   HGB 13.2 14.8 13.2 13.2  HCT 38.9 44.3 39.8 40.3  MCV 89.8 89.9 89.4 89.2  PLT 342 224 325 315   Lab Results  Component Value Date   TSH 0.986 06/04/2017   Lab Results  Component Value Date   HGBA1C 5.8 (H) 01/30/2015   Lab Results  Component Value Date   CHOL 105 01/30/2015   HDL 44 01/30/2015   LDLCALC 45 01/30/2015   TRIG 78 01/30/2015   CHOLHDL 2.4 01/30/2015    Significant Diagnostic Results in last 30 days:  No results found.  Assessment/Plan  #1-history of chronic low back pain again  this appears controlled with the tramadol Neurontin and is on calcitonin spray for osteoporosis she largely ambulates in a wheelchair at times will apparently uses a walker.  #2-hypertension this is stable despite being off her Norvasc.  Norvasc was DC'd secondary to concerns this may be contributing to syncope or been no further syncopal episodes to my knowledge.  #3-history of hypothyroidism as noted above TSH is within normal limits per June lab will monitor periodically.  #4-history of GERD this is stable on ranitidine.  #5 hyponatremia this is mild and appears at baseline at 132 will monitor this periodically as well.  #6 history of vitamin D deficiency she is on supplementation-will update a vitamin D level.  #7 history of anemia this appears stable hemoglobin and actually was 13.2 on lab done in early June since it has been almost 3 months will update this for now updated values. \ \8 History of venous stasis this appears stable actually edema is quite minimal tonight again her legs are elevated bed she has had some history of wounds but apparently these have largely resolved.  [id number]    :

## 2017-08-28 ENCOUNTER — Encounter (HOSPITAL_COMMUNITY)
Admission: RE | Admit: 2017-08-28 | Discharge: 2017-08-28 | Disposition: A | Discharge status: Home / Self Care | Payer: 59 | Source: Skilled Nursing Facility | Attending: Internal Medicine | Admitting: Internal Medicine

## 2017-08-28 DIAGNOSIS — Z8781 Personal history of (healed) traumatic fracture: Secondary | ICD-10-CM | POA: Insufficient documentation

## 2017-08-28 DIAGNOSIS — E039 Hypothyroidism, unspecified: Secondary | ICD-10-CM | POA: Diagnosis present

## 2017-08-28 DIAGNOSIS — I872 Venous insufficiency (chronic) (peripheral): Secondary | ICD-10-CM | POA: Diagnosis present

## 2017-08-28 DIAGNOSIS — M810 Age-related osteoporosis without current pathological fracture: Secondary | ICD-10-CM | POA: Insufficient documentation

## 2017-08-28 DIAGNOSIS — I639 Cerebral infarction, unspecified: Secondary | ICD-10-CM | POA: Diagnosis present

## 2017-08-28 DIAGNOSIS — M549 Dorsalgia, unspecified: Secondary | ICD-10-CM | POA: Diagnosis present

## 2017-08-28 LAB — CBC WITH DIFFERENTIAL/PLATELET
Basophils Absolute: 0.1 10*3/uL (ref 0.0–0.1)
Basophils Relative: 2 %
EOS PCT: 6 %
Eosinophils Absolute: 0.3 10*3/uL (ref 0.0–0.7)
HEMATOCRIT: 39.4 % (ref 36.0–46.0)
HEMOGLOBIN: 13.2 g/dL (ref 12.0–15.0)
LYMPHS ABS: 1.5 10*3/uL (ref 0.7–4.0)
Lymphocytes Relative: 32 %
MCH: 30.2 pg (ref 26.0–34.0)
MCHC: 33.5 g/dL (ref 30.0–36.0)
MCV: 90.2 fL (ref 78.0–100.0)
Monocytes Absolute: 0.5 10*3/uL (ref 0.1–1.0)
Monocytes Relative: 11 %
NEUTROS ABS: 2.4 10*3/uL (ref 1.7–7.7)
NEUTROS PCT: 49 %
Platelets: 276 10*3/uL (ref 150–400)
RBC: 4.37 MIL/uL (ref 3.87–5.11)
RDW: 14.1 % (ref 11.5–15.5)
WBC: 4.8 10*3/uL (ref 4.0–10.5)

## 2017-08-29 LAB — VITAMIN D 25 HYDROXY (VIT D DEFICIENCY, FRACTURES): VIT D 25 HYDROXY: 55.2 ng/mL (ref 30.0–100.0)

## 2017-09-03 ENCOUNTER — Non-Acute Institutional Stay (SKILLED_NURSING_FACILITY): Payer: 59 | Admitting: Internal Medicine

## 2017-09-03 DIAGNOSIS — D638 Anemia in other chronic diseases classified elsewhere: Secondary | ICD-10-CM | POA: Diagnosis not present

## 2017-09-03 DIAGNOSIS — H109 Unspecified conjunctivitis: Secondary | ICD-10-CM

## 2017-09-03 DIAGNOSIS — I10 Essential (primary) hypertension: Secondary | ICD-10-CM | POA: Diagnosis not present

## 2017-09-03 DIAGNOSIS — E559 Vitamin D deficiency, unspecified: Secondary | ICD-10-CM

## 2017-09-03 NOTE — Progress Notes (Signed)
This is an acute visit.  Level care skilled.    Facility [facility].  Chief complaint-acute visit follow-up conjunctivitis.  History of present illness.  Patient is a very pleasant 83 year old female who I saw about a month ago for suspected conjunctivitis and some orbital cellulitis---of her left eye --she completed a course of doxycycline for the cellulitis which appears to be improved-erythema apparently improved somewhat with the Cipro drops that were prescribed however today nursing staff reports her conjunctiva is erythematous and she actually is complaining of decreased vision in that eye.  we did do a culture of any drainage with did not really grow out anything other than normal skin flora  This is somewhat difficult to fully tell since patient is very hard of hearing.  She is not really complaining of pain in the eye.    Past Medical History:  Diagnosis Date  . Back pain   . Confusion   . DNR (do not resuscitate)   . GERD (gastroesophageal reflux disease)   . Glaucoma   . HOH (hard of hearing)   . Pelvic fracture (HCC)    snf  . Thyroid disease         Past Surgical History:  Procedure Laterality Date  . ABDOMINAL SURGERY    . CATARACT EXTRACTION    . HEMORRHOID SURGERY           Allergies  Allergen Reactions  . Latex Itching  . Onion Nausea And Vomiting  . Other     SPANDEX ALLERGY  . Penicillins Other (See Comments)    UNKNOWN REACTION  . Sulfa Antibiotics Other (See Comments)    UNKNOWN REACTION     Medication Sig  . acetaminophen (TYLENOL) 500 MG tablet Take 500 mg by mouth daily.  Marland Kitchen. aspirin EC 81 MG EC tablet Take 1 tablet (81 mg total) by mouth daily.  . calcitonin, salmon, (MIACALCIN/FORTICAL) 200 UNIT/ACT nasal spray Place 1 spray into alternate nostrils daily.   . Calcium Carbonate-Vitamin D (CALCIUM 600 + D PO) Take 1 tablet by mouth every morning.   . cetaphil (CETAPHIL) cream Apply topical to legs  lightly  twice a day  . Cholecalciferol (VITAMIN D3) 1000 UNITS CAPS Take 1 capsule by mouth every morning.   . gabapentin (NEURONTIN) 100 MG capsule Take 1 capsule (100 mg total) by mouth 2 (two) times daily.  Marland Kitchen. levothyroxine (SYNTHROID, LEVOTHROID) 75 MCG tablet Take 75 mcg by mouth daily before breakfast.  . polycarbophil (FIBERCON) 625 MG tablet Take 625 mg by mouth at bedtime.  . polyethylene glycol (MIRALAX / GLYCOLAX) packet Take 17 g by mouth daily.   . ranitidine (ZANTAC) 150 MG tablet Take 150 mg by mouth at bedtime.  . timolol (BETIMOL) 0.5 % ophthalmic solution Place 1 drop into both eyes at bedtime.  . traMADol (ULTRAM) 50 MG tablet Take one tablet by mouth every 8 hours as needed for moderate to severe pain unrelieved by ibuprofen   No facility-administered encounter medications on file as of 08/27/2017.      Review of Systems  This is limited since patient is very hard of hearing.  She is not really complaining of any fever or chills sore throat or acute eye discomfort but says she is having difficulty seeing out of her left eye-        Immunization History  Administered Date(s) Administered  . DT 12/30/2010  . Influenza Split 11/06/2009, 10/26/2010  . Influenza, High Dose Seasonal PF 02/05/2013, 12/10/2013, 10/05/2014  .  Influenza, Seasonal, Injecte, Preservative Fre 10/05/2014  . Influenza-Unspecified 11/06/2009, 10/26/2010, 02/05/2013, 12/10/2013, 10/05/2014, 09/27/2016  . PPD Test 02/14/2015  . Pneumococcal Polysaccharide-23 10/26/2010  . Pneumococcal-Unspecified 12/30/1978, 10/10/2016  . Td 12/30/2010       Pertinent  Health Maintenance Due  Topic Date Due  . INFLUENZA VACCINE  11/29/2017 (Originally 07/30/2017)  . DEXA SCAN  11/29/2017 (Originally 04/14/1979)  . PNA vac Low Risk Adult (2 of 2 - PCV13) 10/10/2017   No flowsheet data found. Functional Status Survey:    Vitals:                 Temperature is 97.6 pulse 85 respirations 18  blood pressure 147/74   Physical Exam   In general this is a frail elderly female in no distress resting comfortably in bed.  Her skin is warm and dry she does have numerous solar induced changes of her arms and legs and face.  She does have venous stasis changes of her lower legs but I do not see signs of cellulitis at this point.He does have a history of wounds on these legs but these appear to be healed  Eyes-she does again have erythema of her left eye conjunctiva with possibly a small amount of clear drainage-visual acuity was somewhat difficult to assess secondary to her hearing difficulties-but it does appear that vision in the left eye is decreased compared to the right-she could identify the number of fingers I was holding up with her right eye but had difficulty with the left t    Heart is regular rate and rhythm without murmur gallop or rub  She has scant lower extremity edema  Abdomen is soft nontender with positive bowel sounds.  Musculoskeletal limited exam since he is in bed but is able to move all extremities 4 with lower extremity weakness which is not new.  . Neurologic is grossly intact her speech is clear appears able to move all extremities at baseline with no lateralizing findings cranial nerves appear to be intact   Labs reviewed:  08/28/2017-vitamin D level   WBC 4.8 hemoglobin 13.2 platelets 276  RecentLabs(withinlast365days)   Recent Labs  02/14/17 0700 06/04/17 0707 07/23/17 0747  NA 134* 132* 132*  K 4.0 4.1 4.2  CL 98* 96* 94*  CO2 29 29 31   GLUCOSE 90 95 92  BUN 20 18 19   CREATININE 0.74 0.77 0.84  CALCIUM 9.1 9.3 9.3      RecentLabs(withinlast365days)   Recent Labs  11/07/16 0900 02/14/17 0700 06/04/17 0707  AST 23 14* 14*  ALT 14 10* 12*  ALKPHOS 59 48 50  BILITOT 0.6 0.4 0.5  PROT 7.3 6.5 6.6  ALBUMIN 3.6 3.3* 3.4*      RecentLabs(withinlast365days)   Recent Labs  10/29/16 0500  11/07/16 0900 02/14/17 0700 06/04/17 0707  WBC 5.6 4.9 4.8 4.7  NEUTROABS 3.2 2.7 2.1  --   HGB 13.2 14.8 13.2 13.2  HCT 38.9 44.3 39.8 40.3  MCV 89.8 89.9 89.4 89.2  PLT 342 224 325 315     RecentLabs       Lab Results  Component Value Date   TSH 0.986 06/04/2017      Assessment and plan.  1 recurrent conjunctivitis-apparently this did improve somewhat on the Cipro but has recurred-actually culture did not really show any significant bacteria-will do a trial course of Patanol eyedrops-also will order an ophthalmology consult-I did discuss this with her son and he is okay with that he would like the  consult to be done in [place] if at all possible and I believe this will be able to be arranged at some point.  At this point does not appear to be in any distress but is concerned with the reoccurrence as well as possible change in vision.  #2 vitamin D deficiency she ishasn supplementation recent lab was reassuring this is been stable.  #3History anemia-this appears to be stable hemoglobin on 08/28/2017 actually is 13.2 which is what it was 3 months ago as well  #4-hypertension-she is not on any hypertensive medicines at one point was on Norvasc but this was DC'd secondary to concerns of syncope-systolic is mildly elevated today but this does not appear to persistent previous blood pressures 125/76-130/68 at this point will monitor considering her advanced age would be  conservative in initiating additional medication   [id number]-of note greater than 25 minutes spent assessing patient discussing her status with her son at bedside-and coordinating plan of care

## 2017-09-08 ENCOUNTER — Encounter: Payer: Self-pay | Admitting: Internal Medicine

## 2017-09-08 ENCOUNTER — Non-Acute Institutional Stay (SKILLED_NURSING_FACILITY): Payer: 59 | Admitting: Internal Medicine

## 2017-09-08 DIAGNOSIS — B079 Viral wart, unspecified: Secondary | ICD-10-CM

## 2017-09-08 DIAGNOSIS — H578 Other specified disorders of eye and adnexa: Secondary | ICD-10-CM | POA: Diagnosis not present

## 2017-09-08 DIAGNOSIS — H5789 Other specified disorders of eye and adnexa: Secondary | ICD-10-CM

## 2017-09-08 NOTE — Progress Notes (Signed)
Location:   [facility] Room Number: 142/D Place of Service:  SNF (31) Provider:  Lenon Curt, Freddie Breech, MD  Patient Care Team: Mahlon Gammon, MD as PCP - General (Internal Medicine)  Extended Emergency Contact Information Primary Emergency Contact: Deguire,Nathan Address: [address]          [place], [place] Home Phone: [phone] Mobile Phone: [phone] Relation: Son  Code Status:  DNR Goals of care: Advanced Directive information Advanced Directives 09/08/2017  Does Patient Have a Medical Advance Directive? Yes  Type of Advance Directive Out of facility DNR (pink MOST or yellow form)  Does patient want to make changes to medical advance directive? No - Patient declined  Copy of Healthcare Power of Attorney in Chart? -  Would patient like information on creating a medical advance directive? -  Pre-existing out of facility DNR order (yellow form or pink MOST form) -     Chief Complaint  Patient presents with  . Acute Visit    Growth on right of forehead and Left eye redness    HPI:  Pt is a 82 y.o. female seen today for an acute visit for Growth on Right side of her Forehead and Persistent redness of  Her left Eye  Patient has h/o Hypertension, Hypothyroidism, Vit D deficiency and GERD.and Chronic back pain She is long term resident of facility. She is very hard of hearing and is unable to give me any history. But per Nurses they had noticed growth on right side of her Forehead. Patient says it just popped up.  Nurses Deny any injury or trauma. Patient denies any pain or itching in that area. Her Left eye is also red. She was given some Patanol and antibiotics but there has not been any improvement. Patient says her vision is Blurry. She denies any pain. But has clear discharge from the eyes.  Past Medical History:  Diagnosis Date  . Back pain   . Confusion   . DNR (do not resuscitate)   . GERD  (gastroesophageal reflux disease)   . Glaucoma   . HOH (hard of hearing)   . Pelvic fracture (HCC)    snf  . Thyroid disease    Past Surgical History:  Procedure Laterality Date  . ABDOMINAL SURGERY    . CATARACT EXTRACTION    . HEMORRHOID SURGERY      Allergies  Allergen Reactions  . Latex Itching  . Onion Nausea And Vomiting  . Other     SPANDEX ALLERGY  . Penicillins Other (See Comments)    UNKNOWN REACTION  . Sulfa Antibiotics Other (See Comments)    UNKNOWN REACTION    Outpatient Encounter Prescriptions as of 09/08/2017  Medication Sig  . acetaminophen (TYLENOL) 500 MG tablet Take 500 mg by mouth daily.  Marland Kitchen aspirin EC 81 MG EC tablet Take 1 tablet (81 mg total) by mouth daily.  . calcitonin, salmon, (MIACALCIN/FORTICAL) 200 UNIT/ACT nasal spray Place 1 spray into alternate nostrils daily.   . Calcium Carbonate-Vitamin D (CALCIUM 600 + D PO) Take 1 tablet by mouth every morning.   . cetaphil (CETAPHIL) cream Apply topical to legs lightly  twice a day  . Cholecalciferol (VITAMIN D3) 1000 UNITS CAPS Take 1 capsule by mouth every morning.   . gabapentin (NEURONTIN) 100 MG capsule Take 1 capsule (100 mg total) by mouth 2 (two) times daily.  Marland Kitchen levothyroxine (SYNTHROID, LEVOTHROID) 75 MCG tablet Take 75 mcg  by mouth daily before breakfast.  . olopatadine (PATANOL) 0.1 % ophthalmic solution Place 1 drop into the left eye 2 (two) times daily.  . polycarbophil (FIBERCON) 625 MG tablet Take 625 mg by mouth at bedtime.  . polyethylene glycol (MIRALAX / GLYCOLAX) packet Take 17 g by mouth daily.   . ranitidine (ZANTAC) 150 MG tablet Take 150 mg by mouth at bedtime.  . timolol (BETIMOL) 0.5 % ophthalmic solution Place 1 drop into both eyes at bedtime.  . traMADol (ULTRAM) 50 MG tablet Take one tablet by mouth every 8 hours as needed for moderate to severe pain unrelieved by ibuprofen   No facility-administered encounter medications on file as of 09/08/2017.      Review of Systems    Review of Systems  Constitutional: Negative for activity change, appetite change, chills, diaphoresis, fatigue and fever.  HENT: Negative for mouth sores, postnasal drip, rhinorrhea, sinus pain and sore throat.   Respiratory: Negative for apnea, cough, chest tightness, shortness of breath and wheezing.   Cardiovascular: Negative for chest pain, palpitations and leg swelling.  Gastrointestinal: Negative for abdominal distention, abdominal pain, constipation, diarrhea, nausea and vomiting.  Genitourinary: Negative for dysuria and frequency.  Musculoskeletal: Negative for arthralgias, joint swelling and myalgias.  Skin: Negative for rash.  Neurological: Negative for dizziness, syncope, weakness, light-headedness and numbness.  Psychiatric/Behavioral: Negative for behavioral problems, confusion and sleep disturbance.     Immunization History  Administered Date(s) Administered  . DT 12/30/2010  . Influenza Split 11/06/2009, 10/26/2010  . Influenza, High Dose Seasonal PF 02/05/2013, 12/10/2013, 10/05/2014  . Influenza, Seasonal, Injecte, Preservative Fre 10/05/2014  . Influenza-Unspecified 11/06/2009, 10/26/2010, 02/05/2013, 12/10/2013, 10/05/2014, 09/27/2016  . PPD Test 02/14/2015  . Pneumococcal Polysaccharide-23 10/26/2010  . Pneumococcal-Unspecified 12/30/1978, 10/10/2016  . Td 12/30/2010   Pertinent  Health Maintenance Due  Topic Date Due  . INFLUENZA VACCINE  11/29/2017 (Originally 07/30/2017)  . DEXA SCAN  11/29/2017 (Originally 04/14/1979)  . PNA vac Low Risk Adult (2 of 2 - PCV13) 10/10/2017   No flowsheet data found. Functional Status Survey:    Vitals:   09/08/17 1052  BP: (!) 112/55  Pulse: 66  Resp: 20  Temp: 98.6 F (37 C)  TempSrc: Oral  SpO2: 95%   There is no height or weight on file to calculate BMI. Physical Exam  Constitutional: She appears well-developed.  HENT:  Head: Normocephalic.  Has wart like growth on her Right side of Head. With no swelling or  tenderness  Eyes:  Left Eye is red. Vision is Blurry.  Neck: Neck supple.  Cardiovascular: Normal rate and normal heart sounds.   Pulmonary/Chest: Effort normal and breath sounds normal. No respiratory distress. She has no wheezes.  Abdominal: Soft. Bowel sounds are normal. She exhibits no distension. There is no tenderness.  Musculoskeletal: She exhibits no edema.  Neurological: She is alert.  Skin: Skin is warm and dry.    Labs reviewed:  Recent Labs  02/14/17 0700 06/04/17 0707 07/23/17 0747  NA 134* 132* 132*  K 4.0 4.1 4.2  CL 98* 96* 94*  CO2 29 29 31   GLUCOSE 90 95 92  BUN 20 18 19   CREATININE 0.74 0.77 0.84  CALCIUM 9.1 9.3 9.3    Recent Labs  11/07/16 0900 02/14/17 0700 06/04/17 0707  AST 23 14* 14*  ALT 14 10* 12*  ALKPHOS 59 48 50  BILITOT 0.6 0.4 0.5  PROT 7.3 6.5 6.6  ALBUMIN 3.6 3.3* 3.4*    Recent Labs  11/07/16 0900 02/14/17 0700 06/04/17 0707 08/28/17 0719  WBC 4.9 4.8 4.7 4.8  NEUTROABS 2.7 2.1  --  2.4  HGB 14.8 13.2 13.2 13.2  HCT 44.3 39.8 40.3 39.4  MCV 89.9 89.4 89.2 90.2  PLT 224 325 315 276   Lab Results  Component Value Date   TSH 0.986 06/04/2017   Lab Results  Component Value Date   HGBA1C 5.8 (H) 01/30/2015   Lab Results  Component Value Date   CHOL 105 01/30/2015   HDL 44 01/30/2015   LDLCALC 45 01/30/2015   TRIG 78 01/30/2015   CHOLHDL 2.4 01/30/2015    Significant Diagnostic Results in last 30 days:  No results found.  Assessment/Plan  Red eye Will start her on Ocuflox for 1 week. Talked to ophthalmologist and they will see her tomorrow to rule out any Acute process.  Wart like growth of her Forehead Will refer to Dermatologist. D/W patient   Family/ staff Communication:   Labs/tests ordered:

## 2017-09-11 ENCOUNTER — Non-Acute Institutional Stay (SKILLED_NURSING_FACILITY): Payer: 59 | Admitting: Internal Medicine

## 2017-09-11 ENCOUNTER — Encounter (HOSPITAL_COMMUNITY)
Admission: RE | Admit: 2017-09-11 | Discharge: 2017-09-11 | Disposition: A | Discharge status: Home / Self Care | Payer: 59 | Source: Skilled Nursing Facility | Attending: Internal Medicine | Admitting: Internal Medicine

## 2017-09-11 ENCOUNTER — Encounter: Payer: Self-pay | Admitting: Internal Medicine

## 2017-09-11 DIAGNOSIS — I872 Venous insufficiency (chronic) (peripheral): Secondary | ICD-10-CM | POA: Diagnosis present

## 2017-09-11 DIAGNOSIS — R5383 Other fatigue: Secondary | ICD-10-CM

## 2017-09-11 DIAGNOSIS — Z8781 Personal history of (healed) traumatic fracture: Secondary | ICD-10-CM | POA: Diagnosis present

## 2017-09-11 DIAGNOSIS — I639 Cerebral infarction, unspecified: Secondary | ICD-10-CM | POA: Diagnosis not present

## 2017-09-11 DIAGNOSIS — M810 Age-related osteoporosis without current pathological fracture: Secondary | ICD-10-CM | POA: Insufficient documentation

## 2017-09-11 DIAGNOSIS — M549 Dorsalgia, unspecified: Secondary | ICD-10-CM | POA: Insufficient documentation

## 2017-09-11 DIAGNOSIS — R4182 Altered mental status, unspecified: Secondary | ICD-10-CM

## 2017-09-11 LAB — CBC WITH DIFFERENTIAL/PLATELET
BASOS ABS: 0.1 10*3/uL (ref 0.0–0.1)
Basophils Relative: 1 %
EOS ABS: 0.1 10*3/uL (ref 0.0–0.7)
EOS PCT: 1 %
HEMATOCRIT: 44.4 % (ref 36.0–46.0)
Hemoglobin: 14.8 g/dL (ref 12.0–15.0)
Lymphocytes Relative: 21 %
Lymphs Abs: 1.8 10*3/uL (ref 0.7–4.0)
MCH: 30 pg (ref 26.0–34.0)
MCHC: 33.3 g/dL (ref 30.0–36.0)
MCV: 89.9 fL (ref 78.0–100.0)
MONO ABS: 0.9 10*3/uL (ref 0.1–1.0)
MONOS PCT: 11 %
Neutro Abs: 5.7 10*3/uL (ref 1.7–7.7)
Neutrophils Relative %: 66 %
PLATELETS: 385 10*3/uL (ref 150–400)
RBC: 4.94 MIL/uL (ref 3.87–5.11)
RDW: 14.3 % (ref 11.5–15.5)
WBC: 8.6 10*3/uL (ref 4.0–10.5)

## 2017-09-11 LAB — COMPREHENSIVE METABOLIC PANEL
ALBUMIN: 4.1 g/dL (ref 3.5–5.0)
ALT: 13 U/L — ABNORMAL LOW (ref 14–54)
ANION GAP: 10 (ref 5–15)
AST: 21 U/L (ref 15–41)
Alkaline Phosphatase: 61 U/L (ref 38–126)
BILIRUBIN TOTAL: 0.7 mg/dL (ref 0.3–1.2)
BUN: 18 mg/dL (ref 6–20)
CHLORIDE: 92 mmol/L — AB (ref 101–111)
CO2: 29 mmol/L (ref 22–32)
Calcium: 9.6 mg/dL (ref 8.9–10.3)
Creatinine, Ser: 0.83 mg/dL (ref 0.44–1.00)
GFR calc Af Amer: >60 mL/min (ref >60)
GFR calc non Af Amer: 55 mL/min — ABNORMAL LOW (ref >60)
GLUCOSE: 108 mg/dL — AB (ref 65–99)
POTASSIUM: 4 mmol/L (ref 3.5–5.1)
SODIUM: 131 mmol/L — AB (ref 135–145)
TOTAL PROTEIN: 7.9 g/dL (ref 6.5–8.1)

## 2017-09-11 LAB — URINALYSIS, ROUTINE W REFLEX MICROSCOPIC
BILIRUBIN URINE: NEGATIVE
Glucose, UA: NEGATIVE mg/dL
HGB URINE DIPSTICK: NEGATIVE
Ketones, ur: NEGATIVE mg/dL
Leukocytes, UA: NEGATIVE
Nitrite: NEGATIVE
PH: 7 (ref 5.0–8.0)
Protein, ur: NEGATIVE mg/dL
Specific Gravity, Urine: 1.014 (ref 1.005–1.030)

## 2017-09-11 LAB — TSH: TSH: 0.44 u[IU]/mL (ref 0.350–4.500)

## 2017-09-11 NOTE — Progress Notes (Signed)
Location:   [facility] Room Number: 142/D Place of Service:  SNF [phone]) Provider:  Sabino Dick, MD  Patient Care Team: Mahlon Gammon, MD as PCP - General (Internal Medicine)  Extended Emergency Contact Information Primary Emergency Contact: Spilde,Nathan Address: [address]          [place], [place] Home Phone: [phone] Mobile Phone: [phone] Relation: Son  Code Status:  DNR Goals of care: Advanced Directive information Advanced Directives 09/11/2017  Does Patient Have a Medical Advance Directive? Yes  Type of Advance Directive Out of facility DNR (pink MOST or yellow form)  Does patient want to make changes to medical advance directive? No - Patient declined  Copy of Healthcare Power of Attorney in Chart? -  Would patient like information on creating a medical advance directive? -  Pre-existing out of facility DNR order (yellow form or pink MOST form) -     Chief Complaint  Patient presents with  . Acute Visit    Lethargy    HPI:  Pt is a 83 y.o. female seen today for an acute visit forLethargy.  Apparently she was in her usual state of health earlier today was bright and alert but apparently around noon became quite lethargic and minimally responsive.  Vital signs were taken which actually showed stability.  With encouragement she was able to eat some lunch however.  I did evaluate her initially and although she was quite somnolent she was easily arousable and did have a fairly benign physical examination.  She does have a history of unresponsive episodes actually was sent to the ER at one point --this was back in 08/03/2016-workup in the ER at that time did not really show any acute process EKG showed normal sinus rhythm she was thought possibly a UTI and did complete an antibiotic.  She had a somewhat similar episode in November 2017 were actually when she heard the sound of her  sons voice  she perked up and appeared to go back to her baseline--again her labs and EKG were normal.  She is also been treated for suspected conjunctivitis of her left eye  she has significant erythema but has been put on a antibiotic ointment steroid combination and this appears to be helping her eye appears to be clearer today   She has a few other medical issues including chronic back pain with osteoporosis which appears well controlled hypertension appears to be stable also a history of GERD which has been controlled on proton pump inhibitor she does have a history of hypothyroidism recent TSH was within normal limits but we will recheck this with her change in mental status she is on Synthroid    Past Medical History:  Diagnosis Date  . Back pain   . Confusion   . DNR (do not resuscitate)   . GERD (gastroesophageal reflux disease)   . Glaucoma   . HOH (hard of hearing)   . Pelvic fracture (HCC)    snf  . Thyroid disease    Past Surgical History:  Procedure Laterality Date  . ABDOMINAL SURGERY    . CATARACT EXTRACTION    . HEMORRHOID SURGERY      Allergies  Allergen Reactions  . Latex Itching  . Onion Nausea And Vomiting  . Other     SPANDEX ALLERGY  . Penicillins Other (See Comments)    UNKNOWN REACTION  . Sulfa Antibiotics Other (See Comments)  UNKNOWN REACTION    Outpatient Encounter Prescriptions as of 09/11/2017  Medication Sig  . acetaminophen (TYLENOL) 500 MG tablet Take 500 mg by mouth daily.  Marland Kitchen aspirin EC 81 MG EC tablet Take 1 tablet (81 mg total) by mouth daily.  . calcitonin, salmon, (MIACALCIN/FORTICAL) 200 UNIT/ACT nasal spray Place 1 spray into alternate nostrils daily.   . Calcium Carbonate-Vitamin D (CALCIUM 600 + D PO) Take 1 tablet by mouth every morning.   . cetaphil (CETAPHIL) cream Apply topical to legs lightly  twice a day  . Cholecalciferol (VITAMIN D3) 1000 UNITS CAPS Take 1 capsule by mouth every morning.   . gabapentin (NEURONTIN) 100  MG capsule Take 1 capsule (100 mg total) by mouth 2 (two) times daily.  Marland Kitchen levothyroxine (SYNTHROID, LEVOTHROID) 75 MCG tablet Take 75 mcg by mouth daily before breakfast.  . olopatadine (PATANOL) 0.1 % ophthalmic solution Place 1 drop into the left eye 2 (two) times daily.  . polycarbophil (FIBERCON) 625 MG tablet Take 625 mg by mouth at bedtime.  . polyethylene glycol (MIRALAX / GLYCOLAX) packet Take 17 g by mouth daily.   . ranitidine (ZANTAC) 150 MG tablet Take 150 mg by mouth at bedtime.  . timolol (BETIMOL) 0.5 % ophthalmic solution Place 1 drop into both eyes at bedtime.  Marland Kitchen tobramycin (TOBREX) 0.3 % ophthalmic solution Place 1 drop into both eyes every 3 (three) hours.  . traMADol (ULTRAM) 50 MG tablet Take one tablet by mouth every 8 hours as needed for moderate to severe pain unrelieved by ibuprofen   No facility-administered encounter medications on file as of 09/11/2017.     Review of Systems   This is limited secondary to patient being hard of hearing-but she is not complaining of shortness of breath nursing staff has not really noted cough-she does not complain of abdominal discomfort there's been no nausea or vomiting again she just been lethargic  Immunization History  Administered Date(s) Administered  . DT 12/30/2010  . Influenza Split 11/06/2009, 10/26/2010  . Influenza, High Dose Seasonal PF 02/05/2013, 12/10/2013, 10/05/2014  . Influenza, Seasonal, Injecte, Preservative Fre 10/05/2014  . Influenza-Unspecified 11/06/2009, 10/26/2010, 02/05/2013, 12/10/2013, 10/05/2014, 09/27/2016  . PPD Test 02/14/2015  . Pneumococcal Polysaccharide-23 10/26/2010  . Pneumococcal-Unspecified 12/30/1978, 10/10/2016  . Td 12/30/2010   Pertinent  Health Maintenance Due  Topic Date Due  . INFLUENZA VACCINE  11/29/2017 (Originally 07/30/2017)  . DEXA SCAN  11/29/2017 (Originally 04/14/1979)  . PNA vac Low Risk Adult (2 of 2 - PCV13) 10/10/2017   No flowsheet data found. Functional Status  Survey:    Vitals:   09/11/17 1450  BP: (!) 140/51  Pulse: 87  Resp: 18  Temp: 98.2 F (36.8 C)  TempSrc: Oral  SpO2: 93%  Of note CBG was 116  Physical Exam   In general this is a pleasant elderly female somewhat lethargic and sleepy but easily arousable when I initially saw her she did make eye contact and smiles.  Her skin is warm and dry she does have a wartlike area on her right for head as noted previously by Dr. Chales Abrahams in dermatology consult is pending-she also has numerous solar induced changes on her arms and legs as well as face.  Eyes pupils appear equal round reactive light sclera and conjunctiva appear to be relatively clear bilaterally day he appears grossly intact.  Oropharynx is clear mucous membranes moist.  Chest is clear to auscultation could not really appreciate any labored breathing rhonchi rales or wheezes.  Heart is regular rate and rhythm without murmur gallop or rub she has trace lower extremity edema with history of venous stasis changes.  Abdomen is soft does not appear to be tender there are positive bowel sounds.  Musculoskeletal is able to move all extremities 4 with general frailty is able to move lower extremities at baseline strength and able to raise Legs against gravity  Neurologic.  Cranial nerves are grossly intact her speech is clear does not speak a whole lot with a history of being very hard of hearing  Strength appears preserved in all 4 extremities is able to raise her arms and shrug her shoulders speech is clear again limited with her hearing difficulty  Psych as an again talked her she was more alert appears to be at her baseline orientation again this was limited with her hearing difficulties but she was pleasant smiling on initial exam appear to Drift off at times sleeping but easily arousable again.  I did reevaluate her 2 later in the day second time she appeared to be more consistently awake again pleasant smiling and the  third time I saw her she was with her son-and appear to be essentially at her baseline was right alert smiling--talking at baseline physical exam was relatively unchanged.           Labs reviewed:  Recent Labs  02/14/17 0700 06/04/17 0707 07/23/17 0747  NA 134* 132* 132*  K 4.0 4.1 4.2  CL 98* 96* 94*  CO2 29 29 31   GLUCOSE 90 95 92  BUN 20 18 19   CREATININE 0.74 0.77 0.84  CALCIUM 9.1 9.3 9.3    Recent Labs  11/07/16 0900 02/14/17 0700 06/04/17 0707  AST 23 14* 14*  ALT 14 10* 12*  ALKPHOS 59 48 50  BILITOT 0.6 0.4 0.5  PROT 7.3 6.5 6.6  ALBUMIN 3.6 3.3* 3.4*    Recent Labs  11/07/16 0900 02/14/17 0700 06/04/17 0707 08/28/17 0719  WBC 4.9 4.8 4.7 4.8  NEUTROABS 2.7 2.1  --  2.4  HGB 14.8 13.2 13.2 13.2  HCT 44.3 39.8 40.3 39.4  MCV 89.9 89.4 89.2 90.2  PLT 224 325 315 276   Lab Results  Component Value Date   TSH 0.986 06/04/2017   Lab Results  Component Value Date   HGBA1C 5.8 (H) 01/30/2015   Lab Results  Component Value Date   CHOL 105 01/30/2015   HDL 44 01/30/2015   LDLCALC 45 01/30/2015   TRIG 78 01/30/2015   CHOLHDL 2.4 01/30/2015    Significant Diagnostic Results in last 30 days:  No results found.  Assessment/Plan  #1-lethargy and unresponsiveness-after initial evaluation I did order labs including a TSH-CBC with differential-CMP-also ordered a urinalysis and culture as well as a chest x-ray.  Also order vital signs pulse ox with neural checks every 4 hours.  Later in the day we did receive labs which were reassuring white count was normal at 8.6 hemoglobin stable at 14.8.  TSH was lower end of normal range at 0.44.  Sodium was 131 potassium 4 BUN 18 creatinine 0.83-CO2 level was 29-liver function tests were essentially within normal limits.  Urinalysis appear to be quite benign but will await culture results.  Again on last reevaluation patient appeared to be near her baseline-will await the chest x-ray and continue to  monitor vital signs pulse ox neural status closely.  There is a possibility this may have been a short duration TIA episode-I did discuss this with her  son as well-she is on aspirin again at this point will monitor   [id number]-of note greater than 40 minutes spent assessing patient-reassessing patient-discussing her status with staff-reviewing her chart-reviewing her labs including updated labs-and coordinating a plan of care as well as discussing her status with her son at bedside-of note greater than 50% of time spent coordinating plan of care with input as noted above

## 2017-09-13 LAB — URINE CULTURE

## 2017-09-16 ENCOUNTER — Encounter: Payer: Self-pay | Admitting: Internal Medicine

## 2017-09-16 ENCOUNTER — Non-Acute Institutional Stay (SKILLED_NURSING_FACILITY): Payer: 59 | Admitting: Internal Medicine

## 2017-09-16 DIAGNOSIS — R410 Disorientation, unspecified: Secondary | ICD-10-CM

## 2017-09-16 DIAGNOSIS — I10 Essential (primary) hypertension: Secondary | ICD-10-CM

## 2017-09-16 DIAGNOSIS — E038 Other specified hypothyroidism: Secondary | ICD-10-CM | POA: Diagnosis not present

## 2017-09-16 DIAGNOSIS — Z7189 Other specified counseling: Secondary | ICD-10-CM

## 2017-09-16 NOTE — Progress Notes (Signed)
Location:   [facility] Room Number: 142/D Place of Service:  SNF (31) Provider:  Lenon Curt, Freddie Breech, MD  Patient Care Team: Mahlon Gammon, MD as PCP - General (Internal Medicine)  Extended Emergency Contact Information Primary Emergency Contact: Gamino,Nathan Address: [address]          [place], [place] Home Phone: [phone] Mobile Phone: [phone] Relation: Son  Code Status:  DNR Goals of care: Advanced Directive information Advanced Directives 09/16/2017  Does Patient Have a Medical Advance Directive? Yes  Type of Advance Directive Out of facility DNR (pink MOST or yellow form)  Does patient want to make changes to medical advance directive? No - Patient declined  Copy of Healthcare Power of Attorney in Chart? -  Would patient like information on creating a medical advance directive? -  Pre-existing out of facility DNR order (yellow form or pink MOST form) -     Chief Complaint  Patient presents with  . Acute Visit    Increased Confusion    HPI:  Pt is a 83 y.o. female seen today for an acute visit for Increased confusion.  Patient has h/o Hypertension, Hypothyroidism, Vit D deficiency and GERD.and Chronic back pain. Patient per nurses has been more confused for past few days. She was seen by PA few days ago and had Blood work done and also Chest Xray.and Urine Culture. Everything came back normal. She just had mild hyponatremia. Patient is in her wheelchair today. She denies any complain to me. Is actually more talkative today. She keeps on describing me about her eye infection and how eye doctor wanted her to put eye drops in her eye. She has had no fever or chills. No Cough or Chest pain or SOB.  Past Medical History:  Diagnosis Date  . Back pain   . Confusion   . DNR (do not resuscitate)   . GERD (gastroesophageal reflux disease)   . Glaucoma   . HOH (hard of hearing)   . Pelvic  fracture (HCC)    snf  . Thyroid disease    Past Surgical History:  Procedure Laterality Date  . ABDOMINAL SURGERY    . CATARACT EXTRACTION    . HEMORRHOID SURGERY      Allergies  Allergen Reactions  . Latex Itching  . Onion Nausea And Vomiting  . Other     SPANDEX ALLERGY  . Penicillins Other (See Comments)    UNKNOWN REACTION  . Sulfa Antibiotics Other (See Comments)    UNKNOWN REACTION    Outpatient Encounter Prescriptions as of 09/16/2017  Medication Sig  . acetaminophen (TYLENOL) 500 MG tablet Take 500 mg by mouth daily.  Marland Kitchen aspirin EC 81 MG EC tablet Take 1 tablet (81 mg total) by mouth daily.  . calcitonin, salmon, (MIACALCIN/FORTICAL) 200 UNIT/ACT nasal spray Place 1 spray into alternate nostrils daily.   . Calcium Carbonate-Vitamin D (CALCIUM 600 + D PO) Take 1 tablet by mouth every morning.   . cetaphil (CETAPHIL) cream Apply topical to legs lightly  twice a day  . Cholecalciferol (VITAMIN D3) 1000 UNITS CAPS Take 1 capsule by mouth every morning.   . gabapentin (NEURONTIN) 100 MG capsule Take 1 capsule (100 mg total) by mouth 2 (two) times daily.  Marland Kitchen levothyroxine (SYNTHROID, LEVOTHROID) 75 MCG tablet Take 75 mcg by mouth daily before breakfast.  . polycarbophil (FIBERCON) 625 MG tablet Take 625 mg by mouth at bedtime.  Marland Kitchen  polyethylene glycol (MIRALAX / GLYCOLAX) packet Take 17 g by mouth daily.   . ranitidine (ZANTAC) 150 MG tablet Take 150 mg by mouth at bedtime.  . timolol (BETIMOL) 0.5 % ophthalmic solution Place 1 drop into both eyes at bedtime.  Marland Kitchen tobramycin (TOBREX) 0.3 % ophthalmic solution Place 1 drop into both eyes 2 (two) times daily.   . traMADol (ULTRAM) 50 MG tablet Take one tablet by mouth every 8 hours as needed for moderate to severe pain unrelieved by ibuprofen  . [DISCONTINUED] olopatadine (PATANOL) 0.1 % ophthalmic solution Place 1 drop into the left eye 2 (two) times daily.   No facility-administered encounter medications on file as of 09/16/2017.       Review of Systems  Unable to perform ROS: Other (Hard of hearing and confused)    Immunization History  Administered Date(s) Administered  . DT 12/30/2010  . Influenza Split 11/06/2009, 10/26/2010  . Influenza, High Dose Seasonal PF 02/05/2013, 12/10/2013, 10/05/2014  . Influenza, Seasonal, Injecte, Preservative Fre 10/05/2014  . Influenza-Unspecified 11/06/2009, 10/26/2010, 02/05/2013, 12/10/2013, 10/05/2014, 09/27/2016  . PPD Test 02/14/2015  . Pneumococcal Polysaccharide-23 10/26/2010  . Pneumococcal-Unspecified 12/30/1978, 10/10/2016  . Td 12/30/2010   Pertinent  Health Maintenance Due  Topic Date Due  . INFLUENZA VACCINE  11/29/2017 (Originally 07/30/2017)  . DEXA SCAN  11/29/2017 (Originally 04/14/1979)  . PNA vac Low Risk Adult (2 of 2 - PCV13) 10/10/2017   No flowsheet data found. Functional Status Survey:    Vitals:   09/16/17 1653  BP: (!) 146/71  Pulse: 77  Resp: (!) 22  Temp: (!) 97.2 F (36.2 C)  SpO2: 93%   There is no height or weight on file to calculate BMI. Physical Exam  Constitutional: She appears well-developed.  HENT:  Head: Normocephalic.  Mouth/Throat: Oropharynx is clear and moist.  No More redness in eye  Neck: Neck supple.  Cardiovascular: Normal rate and normal heart sounds.   Pulmonary/Chest: Effort normal and breath sounds normal. No respiratory distress. She has no wheezes. She has no rales.  Abdominal: Soft. Bowel sounds are normal. She exhibits no distension. There is no tenderness. There is no rebound.  Musculoskeletal: She exhibits no edema.  Neurological: She is alert.  No Focal Deficits. Can follow commands and was naming objects for me.  Skin: Skin is warm and dry.  Psychiatric: She has a normal mood and affect. Her behavior is normal.    Labs reviewed:  Recent Labs  06/04/17 0707 07/23/17 0747 09/11/17 1515  NA 132* 132* 131*  K 4.1 4.2 4.0  CL 96* 94* 92*  CO2 GLUCOSE 95 92 108*  BUN CREATININE 0.77 0.84 0.83  CALCIUM 9.3 9.3 9.6    Recent Labs  02/14/17 0700 06/04/17 0707 09/11/17 1515  AST 14* 14* 21  ALT 10* 12* 13*  ALKPHOS 48 50 61  BILITOT 0.4 0.5 0.7  PROT 6.5 6.6 7.9  ALBUMIN 3.3* 3.4* 4.1    Recent Labs  02/14/17 0700 06/04/17 0707 08/28/17 0719 09/11/17 1515  WBC 4.8 4.7 4.8 8.6  NEUTROABS 2.1  --  2.4 5.7  HGB 13.2 13.2 13.2 14.8  HCT 39.8 40.3 39.4 44.4  MCV 89.4 89.2 90.2 89.9  PLT 325 315 276 385   Lab Results  Component Value Date   TSH 0.440 09/11/2017   Lab Results  Component Value Date   HGBA1C 5.8 (H) 01/30/2015   Lab Results  Component Value Date  CHOL 105 01/30/2015   HDL 44 01/30/2015   LDLCALC 45 01/30/2015   TRIG 78 01/30/2015   CHOLHDL 2.4 01/30/2015    Significant Diagnostic Results in last 30 days:  No results found.  Assessment/Plan  Increased Confusion In my visit I don't see any change but D/W Nurses and Her son and they think she has been more confused . Son Even mention she was hallucinating about certain things. At this time infectious etiology is ruled out. Will repeat UA again I have reviewed her meds and will hold Neurontin and Ultram for Now.thoiugh she has been on that for long time.  Repeat BMP to make sure her Sodium is stable. At this time it can be that she has had CVA effecting her mental status. I d/w the son and he at this time does not want any aggressive work up including CT scan.She is already on Aspirin. I have suggested him to sign MOST form . But he does not want Hospitalization right now. He wants her Mom to be comfortable.    Family/ staff Communication:   Labs/tests ordered:  BMP, UA  Total time spent in this patient care encounter was 35_ minutes; greater than 50% of the visit spent counseling patient and end of life care discussion with Son., reviewing records , Labs and coordinating care for problems addressed at this encounter.

## 2017-09-17 ENCOUNTER — Encounter (HOSPITAL_COMMUNITY)
Admission: RE | Admit: 2017-09-17 | Discharge: 2017-09-17 | Disposition: A | Discharge status: Home / Self Care | Payer: 59 | Source: Skilled Nursing Facility | Attending: Internal Medicine | Admitting: Internal Medicine

## 2017-09-17 DIAGNOSIS — Z8781 Personal history of (healed) traumatic fracture: Secondary | ICD-10-CM | POA: Insufficient documentation

## 2017-09-17 DIAGNOSIS — M549 Dorsalgia, unspecified: Secondary | ICD-10-CM | POA: Insufficient documentation

## 2017-09-17 DIAGNOSIS — I872 Venous insufficiency (chronic) (peripheral): Secondary | ICD-10-CM | POA: Insufficient documentation

## 2017-09-17 DIAGNOSIS — M810 Age-related osteoporosis without current pathological fracture: Secondary | ICD-10-CM | POA: Insufficient documentation

## 2017-09-17 DIAGNOSIS — I639 Cerebral infarction, unspecified: Secondary | ICD-10-CM | POA: Diagnosis present

## 2017-09-17 LAB — BASIC METABOLIC PANEL
ANION GAP: 8 (ref 5–15)
BUN: 17 mg/dL (ref 6–20)
CO2: 30 mmol/L (ref 22–32)
Calcium: 9.8 mg/dL (ref 8.9–10.3)
Chloride: 95 mmol/L — ABNORMAL LOW (ref 101–111)
Creatinine, Ser: 0.81 mg/dL (ref 0.44–1.00)
GFR calc Af Amer: >60 mL/min (ref >60)
GFR, EST NON AFRICAN AMERICAN: 56 mL/min — AB (ref >60)
Glucose, Bld: 98 mg/dL (ref 65–99)
POTASSIUM: 3.7 mmol/L (ref 3.5–5.1)
SODIUM: 133 mmol/L — AB (ref 135–145)

## 2017-09-20 LAB — URINE CULTURE

## 2017-09-22 ENCOUNTER — Non-Acute Institutional Stay (SKILLED_NURSING_FACILITY): Payer: 59 | Admitting: Internal Medicine

## 2017-09-22 ENCOUNTER — Encounter: Payer: Self-pay | Admitting: Internal Medicine

## 2017-09-22 DIAGNOSIS — I10 Essential (primary) hypertension: Secondary | ICD-10-CM | POA: Diagnosis not present

## 2017-09-22 DIAGNOSIS — K219 Gastro-esophageal reflux disease without esophagitis: Secondary | ICD-10-CM

## 2017-09-22 DIAGNOSIS — E038 Other specified hypothyroidism: Secondary | ICD-10-CM | POA: Diagnosis not present

## 2017-09-22 DIAGNOSIS — W19XXXA Unspecified fall, initial encounter: Secondary | ICD-10-CM

## 2017-09-22 NOTE — Progress Notes (Deleted)
Location:   [facility] Room Number: 142/D Place of Service:  SNF (31) Provider:  Lenon Curt, Freddie Breech, MD  Patient Care Team: Mahlon Gammon, MD as PCP - General (Internal Medicine)  Extended Emergency Contact Information Primary Emergency Contact: Spradley,Nathan Address: [address]          [place], [place] Home Phone: [phone] Mobile Phone: [phone] Relation: Son  Code Status:  DNR Goals of care: Advanced Directive information Advanced Directives 09/22/2017  Does Patient Have a Medical Advance Directive? Yes  Type of Advance Directive Out of facility DNR (pink MOST or yellow form)  Does patient want to make changes to medical advance directive? No - Patient declined  Copy of Healthcare Power of Attorney in Chart? -  Would patient like information on creating a medical advance directive? -  Pre-existing out of facility DNR order (yellow form or pink MOST form) -     Chief Complaint  Patient presents with  . Acute Visit    F/U Fall    HPI:  Pt is a 83 y.o. female seen today for an acute visit for    Past Medical History:  Diagnosis Date  . Back pain   . Confusion   . DNR (do not resuscitate)   . GERD (gastroesophageal reflux disease)   . Glaucoma   . HOH (hard of hearing)   . Pelvic fracture (HCC)    snf  . Thyroid disease    Past Surgical History:  Procedure Laterality Date  . ABDOMINAL SURGERY    . CATARACT EXTRACTION    . HEMORRHOID SURGERY      Allergies  Allergen Reactions  . Latex Itching  . Onion Nausea And Vomiting  . Other     SPANDEX ALLERGY  . Penicillins Other (See Comments)    UNKNOWN REACTION  . Sulfa Antibiotics Other (See Comments)    UNKNOWN REACTION    Outpatient Encounter Prescriptions as of 09/22/2017  Medication Sig  . acetaminophen (TYLENOL) 500 MG tablet Take 500 mg by mouth daily.  Marland Kitchen aspirin EC 81 MG EC tablet Take 1 tablet (81 mg total) by  mouth daily.  . calcitonin, salmon, (MIACALCIN/FORTICAL) 200 UNIT/ACT nasal spray Place 1 spray into alternate nostrils daily.   . Calcium Carbonate-Vitamin D (CALCIUM 600 + D PO) Take 1 tablet by mouth every morning.   . cetaphil (CETAPHIL) cream Apply topical to legs lightly  twice a day  . Cholecalciferol (VITAMIN D3) 1000 UNITS CAPS Take 1 capsule by mouth every morning.   Marland Kitchen levothyroxine (SYNTHROID, LEVOTHROID) 75 MCG tablet Take 75 mcg by mouth daily before breakfast.  . polycarbophil (FIBERCON) 625 MG tablet Take 625 mg by mouth at bedtime.  . polyethylene glycol (MIRALAX / GLYCOLAX) packet Take 17 g by mouth daily.   . ranitidine (ZANTAC) 150 MG tablet Take 150 mg by mouth at bedtime.  . timolol (BETIMOL) 0.5 % ophthalmic solution Place 1 drop into both eyes at bedtime.  Marland Kitchen tobramycin (TOBREX) 0.3 % ophthalmic solution Place 1 drop into both eyes 2 (two) times daily.   . [DISCONTINUED] gabapentin (NEURONTIN) 100 MG capsule Take 1 capsule (100 mg total) by mouth 2 (two) times daily.  . [DISCONTINUED] traMADol (ULTRAM) 50 MG tablet Take one tablet by mouth every 8 hours as needed for moderate to severe pain unrelieved by ibuprofen   No facility-administered encounter medications on file as of 09/22/2017.      Review  of Systems  Immunization History  Administered Date(s) Administered  . DT 12/30/2010  . Influenza Split 11/06/2009, 10/26/2010  . Influenza, High Dose Seasonal PF 02/05/2013, 12/10/2013, 10/05/2014  . Influenza, Seasonal, Injecte, Preservative Fre 10/05/2014  . Influenza-Unspecified 11/06/2009, 10/26/2010, 02/05/2013, 12/10/2013, 10/05/2014, 09/27/2016  . PPD Test 02/14/2015  . Pneumococcal Polysaccharide-23 10/26/2010  . Pneumococcal-Unspecified 12/30/1978, 10/10/2016  . Td 12/30/2010   Pertinent  Health Maintenance Due  Topic Date Due  . INFLUENZA VACCINE  11/29/2017 (Originally 07/30/2017)  . DEXA SCAN  11/29/2017 (Originally 04/14/1979)  . PNA vac Low Risk Adult  (2 of 2 - PCV13) 10/10/2017   No flowsheet data found. Functional Status Survey:    There were no vitals filed for this visit. There is no height or weight on file to calculate BMI. Physical Exam  Labs reviewed:  Recent Labs  07/23/17 0747 09/11/17 1515 09/17/17 0705  NA 132* 131* 133*  K 4.2 4.0 3.7  CL 94* 92* 95*  CO2 GLUCOSE 92 108* 98  BUN CREATININE 0.84 0.83 0.81  CALCIUM 9.3 9.6 9.8    Recent Labs  02/14/17 0700 06/04/17 0707 09/11/17 1515  AST 14* 14* 21  ALT 10* 12* 13*  ALKPHOS 48 50 61  BILITOT 0.4 0.5 0.7  PROT 6.5 6.6 7.9  ALBUMIN 3.3* 3.4* 4.1    Recent Labs  02/14/17 0700 06/04/17 0707 08/28/17 0719 09/11/17 1515  WBC 4.8 4.7 4.8 8.6  NEUTROABS 2.1  --  2.4 5.7  HGB 13.2 13.2 13.2 14.8  HCT 39.8 40.3 39.4 44.4  MCV 89.4 89.2 90.2 89.9  PLT 325 315 276 385   Lab Results  Component Value Date   TSH 0.440 09/11/2017   Lab Results  Component Value Date   HGBA1C 5.8 (H) 01/30/2015   Lab Results  Component Value Date   CHOL 105 01/30/2015   HDL 44 01/30/2015   LDLCALC 45 01/30/2015   TRIG 78 01/30/2015   CHOLHDL 2.4 01/30/2015    Significant Diagnostic Results in last 30 days:  No results found.  Assessment/Plan There are no diagnoses linked to this encounter.   Family/ staff Communication:   Labs/tests ordered:

## 2017-09-23 ENCOUNTER — Encounter: Payer: Self-pay | Admitting: Internal Medicine

## 2017-09-23 NOTE — Progress Notes (Signed)
Location:   [facility] Room Number: 142/D Place of Service:  SNF (31) Provider:  Lenon Curt, Freddie Breech, MD  Patient Care Team: Mahlon Gammon, MD as PCP - General (Internal Medicine)  Extended Emergency Contact Information Primary Emergency Contact: Jerrell,Nathan Address: [address]          [place], [place] Home Phone: [phone] Mobile Phone: [phone] Relation: Son  Code Status:  DNR Goals of care: Advanced Directive information Advanced Directives 09/22/2017  Does Patient Have a Medical Advance Directive? Yes  Type of Advance Directive Out of facility DNR (pink MOST or yellow form)  Does patient want to make changes to medical advance directive? No - Patient declined  Copy of Healthcare Power of Attorney in Chart? -  Would patient like information on creating a medical advance directive? -  Pre-existing out of facility DNR order (yellow form or pink MOST form) -     Chief Complaint  Patient presents with  . Medical Management of Chronic Issues    Routine Visit  . Acute Visit    F/U Fall    HPI:  Pt is a 83 y.o. female seen today for medical management of chronic diseases.    Patient has h/o Hypertension, Hypothyroidism, Vit D deficiency and GERD.and Chronic back pain Patient was seen today for routine visit. But last nite Nurses say that patient was trying to get out of her chair as she was confused and she fell. Her husband wanted her to stay in the facility and go outside to hospital. She did have some bleeding from her wound but mostly it stopped after pressure. Nurses have been doing Neuro Check on her. But patient continues to be at her baseline. She was sleeping when I saw her this morning. She is not able to give me any history.. Per nurses and her son she has been confused lately. Though her Labs have been normal. Her son wants her to stay here any be comfortable. Her weight stays stable  at 140 lbs Past Medical History:  Diagnosis Date  . Back pain   . Confusion   . DNR (do not resuscitate)   . GERD (gastroesophageal reflux disease)   . Glaucoma   . HOH (hard of hearing)   . Pelvic fracture (HCC)    snf  . Thyroid disease    Past Surgical History:  Procedure Laterality Date  . ABDOMINAL SURGERY    . CATARACT EXTRACTION    . HEMORRHOID SURGERY      Allergies  Allergen Reactions  . Latex Itching  . Onion Nausea And Vomiting  . Other     SPANDEX ALLERGY  . Penicillins Other (See Comments)    UNKNOWN REACTION  . Sulfa Antibiotics Other (See Comments)    UNKNOWN REACTION    Outpatient Encounter Prescriptions as of 09/22/2017  Medication Sig  . acetaminophen (TYLENOL) 500 MG tablet Take 500 mg by mouth daily.  Marland Kitchen aspirin EC 81 MG EC tablet Take 1 tablet (81 mg total) by mouth daily.  . calcitonin, salmon, (MIACALCIN/FORTICAL) 200 UNIT/ACT nasal spray Place 1 spray into alternate nostrils daily.   . Calcium Carbonate-Vitamin D (CALCIUM 600 + D PO) Take 1 tablet by mouth every morning.   . cetaphil (CETAPHIL) cream Apply topical to legs lightly  twice a day  . Cholecalciferol (VITAMIN D3) 1000 UNITS CAPS Take 1 capsule by mouth every morning.   Marland Kitchen levothyroxine (SYNTHROID, LEVOTHROID) 75 MCG  tablet Take 75 mcg by mouth daily before breakfast.  . polycarbophil (FIBERCON) 625 MG tablet Take 625 mg by mouth at bedtime.  . polyethylene glycol (MIRALAX / GLYCOLAX) packet Take 17 g by mouth daily.   . ranitidine (ZANTAC) 150 MG tablet Take 150 mg by mouth at bedtime.  . timolol (BETIMOL) 0.5 % ophthalmic solution Place 1 drop into both eyes at bedtime.  Marland Kitchen tobramycin (TOBREX) 0.3 % ophthalmic solution Place 1 drop into both eyes 2 (two) times daily.   . [DISCONTINUED] gabapentin (NEURONTIN) 100 MG capsule Take 1 capsule (100 mg total) by mouth 2 (two) times daily.  . [DISCONTINUED] traMADol (ULTRAM) 50 MG tablet Take one tablet by mouth every 8 hours as needed for  moderate to severe pain unrelieved by ibuprofen   No facility-administered encounter medications on file as of 09/22/2017.      Review of Systems  Unable to perform ROS: Other    Immunization History  Administered Date(s) Administered  . DT 12/30/2010  . Influenza Split 11/06/2009, 10/26/2010  . Influenza, High Dose Seasonal PF 02/05/2013, 12/10/2013, 10/05/2014  . Influenza, Seasonal, Injecte, Preservative Fre 10/05/2014  . Influenza-Unspecified 11/06/2009, 10/26/2010, 02/05/2013, 12/10/2013, 10/05/2014, 09/27/2016  . PPD Test 02/14/2015  . Pneumococcal Polysaccharide-23 10/26/2010  . Pneumococcal-Unspecified 12/30/1978, 10/10/2016  . Td 12/30/2010   Pertinent  Health Maintenance Due  Topic Date Due  . INFLUENZA VACCINE  11/29/2017 (Originally 07/30/2017)  . DEXA SCAN  11/29/2017 (Originally 04/14/1979)  . PNA vac Low Risk Adult (2 of 2 - PCV13) 10/10/2017   No flowsheet data found. Functional Status Survey:    Vitals:   09/23/17 1614  BP: (!) 112/58  Pulse: 93  Resp: 14  Temp: 99.3 F (37.4 C)  TempSrc: Oral  SpO2: 95%  Weight: 140 lb 6.4 oz (63.7 kg)  Height:  (1.651 m)   Body mass index is 23.36 kg/m. Physical Exam  Constitutional: She appears well-developed.  HENT:  Head: Normocephalic.  Mouth/Throat: Oropharynx is clear and moist.  No Laceration some clotted blood around her lesion in Forehead  Eyes: Pupils are equal, round, and reactive to light.  Eyes not congested  Neck: Neck supple.  Cardiovascular: Normal rate and normal heart sounds.   Pulmonary/Chest: Effort normal and breath sounds normal. No respiratory distress. She has no wheezes.  Abdominal: Soft. Bowel sounds are normal. She exhibits no distension. There is no tenderness. There is no rebound.  Musculoskeletal: She exhibits edema.  Neurological: She is alert.  Patient sleeping but responds to commands. Not very talkative today    Labs reviewed:  Recent Labs  07/23/17 0747  09/11/17 1515 09/17/17 0705  NA 132* 131* 133*  K 4.2 4.0 3.7  CL 94* 92* 95*  CO2 GLUCOSE 92 108* 98  BUN CREATININE 0.84 0.83 0.81  CALCIUM 9.3 9.6 9.8    Recent Labs  02/14/17 0700 06/04/17 0707 09/11/17 1515  AST 14* 14* 21  ALT 10* 12* 13*  ALKPHOS 48 50 61  BILITOT 0.4 0.5 0.7  PROT 6.5 6.6 7.9  ALBUMIN 3.3* 3.4* 4.1    Recent Labs  02/14/17 0700 06/04/17 0707 08/28/17 0719 09/11/17 1515  WBC 4.8 4.7 4.8 8.6  NEUTROABS 2.1  --  2.4 5.7  HGB 13.2 13.2 13.2 14.8  HCT 39.8 40.3 39.4 44.4  MCV 89.4 89.2 90.2 89.9  PLT 325 315 276 385   Lab Results  Component Value Date   TSH 0.440 09/11/2017  Lab Results  Component Value Date   HGBA1C 5.8 (H) 01/30/2015   Lab Results  Component Value Date   CHOL 105 01/30/2015   HDL 44 01/30/2015   LDLCALC 45 01/30/2015   TRIG 78 01/30/2015   CHOLHDL 2.4 01/30/2015    Significant Diagnostic Results in last 30 days:  No results found.  Assessment/Plan  Mental status change. D/w the son last visit and he does not want any aggressive work up. She is on aspirin and all her work up has been negative for any acute infection. Continue supportive care. Neuro Check 24 hour. I had stopped her Neurontin and Ultram. Hypertension Stable on no meds GERD On ranitidine Hypothyroidism TSH normal in 09/18 Continue same dose Bacterial Conjunctivitis Doing well on Tobradex per opthalmology  Chronic back pain due to osteoporosis  Stable on  calcitonin spray Ultram and Neurontin D'ced.  Family/ staff Communication:   Labs/tests ordered:    Total time spent in this patient care encounter was 25_ minutes; greater than 50% of the visit spent counseling patient, reviewing records , Labs and coordinating care for problems addressed at this encounter.

## 2017-10-16 ENCOUNTER — Encounter: Payer: Self-pay | Admitting: Internal Medicine

## 2017-10-16 ENCOUNTER — Non-Acute Institutional Stay (SKILLED_NURSING_FACILITY): Payer: 59 | Admitting: Internal Medicine

## 2017-10-16 DIAGNOSIS — M5489 Other dorsalgia: Secondary | ICD-10-CM | POA: Diagnosis not present

## 2017-10-16 DIAGNOSIS — E038 Other specified hypothyroidism: Secondary | ICD-10-CM | POA: Diagnosis not present

## 2017-10-16 DIAGNOSIS — K219 Gastro-esophageal reflux disease without esophagitis: Secondary | ICD-10-CM

## 2017-10-16 DIAGNOSIS — I10 Essential (primary) hypertension: Secondary | ICD-10-CM | POA: Diagnosis not present

## 2017-10-16 DIAGNOSIS — R634 Abnormal weight loss: Secondary | ICD-10-CM | POA: Diagnosis not present

## 2017-10-16 NOTE — Progress Notes (Signed)
Location:   [facility] Room Number: 142/D Place of Service:  SNF (31) Provider:  Sabino Dick, MD  Patient Care Team: Mahlon Gammon, MD as PCP - General (Internal Medicine)  Extended Emergency Contact Information Primary Emergency Contact: Vue,Nathan Address: [address]          [place], [place] Home Phone: [phone] Mobile Phone: [phone] Relation: Son  Code Status:  DNR Goals of care: Advanced Directive information Advanced Directives 10/16/2017  Does Patient Have a Medical Advance Directive? Yes  Type of Advance Directive Out of facility DNR (pink MOST or yellow form)  Does patient want to make changes to medical advance directive? No - Patient declined  Copy of Healthcare Power of Attorney in Chart? No - copy requested  Would patient like information on creating a medical advance directive? -  Pre-existing out of facility DNR order (yellow form or pink MOST form) -     Chief Complaint  Patient presents with  . Medical Management of Chronic Issues    Routine Visit  Medical management of chronic medical conditions including hypertension-hypothyroidism-vitamin D deficiency-GERD-chronic back pain-  HPI:  Pt is a 83 y.o. female seen today for medical management of chronic diseases. As noted above. She continues to do relatively well with supportive care although appears to be gradually declining somewhat-she has lost some weight has been started on supplementation.  She also at times has had some increased confusion labs have been normal however-her Neurontin and tramadol also has been discontinued thinking this may be contributing.  She does have a history of chronic back pain but at this point appears to be controlled with the Tylenol-she is also on calcitonin spray as well as calcium with vitamin D.  Regards to hypertension she is not on any medications nonetheless blood pressure  appears to be stable most recently 110/54.  She also has a history of conjunctivitis is completing a course of TobraDex this appears to be held.  Currently she is resting in bed comfortably nursing staff he is feeding her.    Past Medical History:  Diagnosis Date  . Back pain   . Confusion   . DNR (do not resuscitate)   . GERD (gastroesophageal reflux disease)   . Glaucoma   . HOH (hard of hearing)   . Pelvic fracture (HCC)    snf  . Thyroid disease    Past Surgical History:  Procedure Laterality Date  . ABDOMINAL SURGERY    . CATARACT EXTRACTION    . HEMORRHOID SURGERY      Allergies  Allergen Reactions  . Latex Itching  . Onion Nausea And Vomiting  . Other     SPANDEX ALLERGY  . Penicillins Other (See Comments)    UNKNOWN REACTION  . Sulfa Antibiotics Other (See Comments)    UNKNOWN REACTION    Allergies as of 10/16/2017      Reactions   Latex Itching   Onion Nausea And Vomiting   Other    SPANDEX ALLERGY   Penicillins Other (See Comments)   UNKNOWN REACTION   Sulfa Antibiotics Other (See Comments)   UNKNOWN REACTION      Medication List    Notice   This visit is during an admission. Changes to the med list made in this visit will be reflected in the After Visit Summary of the admission.     Review of Systems   Unable to perform review of  systems patient is very hard of hearing please see history of present illness Immunization History  Administered Date(s) Administered  . DT 12/30/2010  . Influenza Split 11/06/2009, 10/26/2010  . Influenza, High Dose Seasonal PF 02/05/2013, 12/10/2013, 10/05/2014  . Influenza, Seasonal, Injecte, Preservative Fre 10/05/2014  . Influenza-Unspecified 11/06/2009, 10/26/2010, 02/05/2013, 12/10/2013, 10/05/2014, 09/27/2016, 10/03/2017  . PPD Test 02/14/2015  . Pneumococcal Polysaccharide-23 10/26/2010  . Pneumococcal-Unspecified 12/30/1978, 10/10/2016  . Td 12/30/2010   Pertinent  Health Maintenance Due  Topic  Date Due  . DEXA SCAN  11/29/2017 (Originally 04/14/1979)  . PNA vac Low Risk Adult (2 of 2 - PCV13) 11/29/2017 (Originally 10/10/2017)  . INFLUENZA VACCINE  Completed   No flowsheet data found. Functional Status Survey:    Vitals:   10/16/17 1610  BP: (!) 110/54  Pulse: 89  Resp: (!) 22  Temp: 98.4 F (36.9 C)  TempSrc: Oral  SpO2: 95%  Weight: 132 lb 9.6 oz (60.1 kg)  Height: 5\' 5"  (1.651 m)   Body mass index is 22.07 kg/m. Physical Exam In general this is a pleasant elderly female who appears to be somewhat increasingly frail and clean in bed she is being fed dinner by nursing staff appears to be doing relatively well.  Her skin is warm and dry she does have a crusted lesion right side of her forehead family's wish conservative follow-up.  A somewhat limited exam since she closes her eyes fairly tightly but from what I could ascertain conjunctiva appear to be relatively clear and acuity intact.  Heart is regular rate and rhythm she has trace lower extremity edema cannot appreciate a murmur gallop or rub.  Chest is clear to auscultation there is no labored breathing.  Abdomen is soft nontender with positive bowel sounds.  Muscle skeletal has general frailty is able to move all extremities 4 often sits in a wheelchair she does have venous stasis lower extremities are edema actually appears to be somewhat improved from previous exams I do not see any sign of cellulitis of her lower extremities.  Neurologic is grossly intact her speech is clear but fairly minimal she is very hard of hearing no lateralizing findings.  Psych she is oriented to self again difficult to do a formal psych assessment secondary to her hearing difficulties appears to be baseline  Labs reviewed:  Recent Labs  07/23/17 0747 09/11/17 1515 09/17/17 0705  NA 132* 131* 133*  K 4.2 4.0 3.7  CL 94* 92* 95*  CO2 31 29 30   GLUCOSE 92 108* 98  BUN 19 18 17   CREATININE 0.84 0.83 0.81  CALCIUM 9.3 9.6  9.8    Recent Labs  02/14/17 0700 06/04/17 0707 09/11/17 1515  AST 14* 14* 21  ALT 10* 12* 13*  ALKPHOS 48 50 61  BILITOT 0.4 0.5 0.7  PROT 6.5 6.6 7.9  ALBUMIN 3.3* 3.4* 4.1    Recent Labs  02/14/17 0700 06/04/17 0707 08/28/17 0719 09/11/17 1515  WBC 4.8 4.7 4.8 8.6  NEUTROABS 2.1  --  2.4 5.7  HGB 13.2 13.2 13.2 14.8  HCT 39.8 40.3 39.4 44.4  MCV 89.4 89.2 90.2 89.9  PLT 325 315 276 385   Lab Results  Component Value Date   TSH 0.440 09/11/2017   Lab Results  Component Value Date   HGBA1C 5.8 (H) 01/30/2015   Lab Results  Component Value Date   CHOL 105 01/30/2015   HDL 44 01/30/2015   LDLCALC 45 01/30/2015   TRIG 78 01/30/2015  CHOLHDL 2.4 01/30/2015    Significant Diagnostic Results in last 30 days:  No results found.  Assessment/Plan  #1-weight loss-again this is being addressed supplements I suspect this may be challenging with her advanced age and gradual decline again her son wishes conservative treatment without aggressive follow-up hospitalization-- she appears to be doing relatively speaking well with supportive care.  #2 history of chronic back pain again this is been challenging with her mental status changes her tramadol on Neurontin been discontinued she does have Tylenol when necessary does not appear to be in pain she is on calcium supplementation including spray as well as calcium with vitamin D.  #3 history of hypothyroidism TSH was 0.440 September 2018 will monitor periodically.  She is on Synthroid.  #4-history GERD she is on ranitidin--this appears relatively asymptomatic.  #5 history of vitamin D deficiency again she is on supplementation vitamin D level was over 55 on lab done in late August.  #6-history of conjunctivitis she is finishing a course of TobraDex from what I could ascertain on exam this is improving.  [id number]

## 2017-11-06 ENCOUNTER — Non-Acute Institutional Stay (SKILLED_NURSING_FACILITY): Payer: 59 | Admitting: Internal Medicine

## 2017-11-06 ENCOUNTER — Encounter: Payer: Self-pay | Admitting: Internal Medicine

## 2017-11-06 DIAGNOSIS — M810 Age-related osteoporosis without current pathological fracture: Secondary | ICD-10-CM

## 2017-11-06 DIAGNOSIS — I10 Essential (primary) hypertension: Secondary | ICD-10-CM

## 2017-11-06 DIAGNOSIS — R5383 Other fatigue: Secondary | ICD-10-CM | POA: Diagnosis not present

## 2017-11-06 DIAGNOSIS — H10502 Unspecified blepharoconjunctivitis, left eye: Secondary | ICD-10-CM

## 2017-11-06 DIAGNOSIS — M5489 Other dorsalgia: Secondary | ICD-10-CM

## 2017-11-06 DIAGNOSIS — E038 Other specified hypothyroidism: Secondary | ICD-10-CM

## 2017-11-06 DIAGNOSIS — K219 Gastro-esophageal reflux disease without esophagitis: Secondary | ICD-10-CM | POA: Diagnosis not present

## 2017-11-06 NOTE — Progress Notes (Signed)
Location:   [facility] Room Number: 142/D Place of Service:  SNF (31) Provider:  Lenon CurtAnjali,Gupta  Gupta, Freddie BreechAnjali L, MD  Patient Care Team: Mahlon GammonGupta, [name], MD as PCP - General (Internal Medicine)  Extended Emergency Contact Information Primary Emergency Contact: Hammitt,Nathan Address: [address]          [place], [place] Home Phone: [phone] Mobile Phone: [phone] Relation: Son  Code Status:  DNR Goals of care: Advanced Directive information Advanced Directives 11/06/2017  Does Patient Have a Medical Advance Directive? Yes  Type of Advance Directive Out of facility DNR (pink MOST or yellow form)  Does patient want to make changes to medical advance directive? No - Patient declined  Copy of Healthcare Power of Attorney in Chart? No - copy requested  Would patient like information on creating a medical advance directive? -  Pre-existing out of facility DNR order (yellow form or pink MOST form) -     Chief Complaint  Patient presents with  . Medical Management of Chronic Issues    Routine Visit    HPI:  Pt is a [age] y.o. female seen today for medical management of chronic diseases.    Patient has h/o Hypertension, Hypothyroidism, Vit D deficiency and GERD.and Chronic back pain  Patient unable to give me any history but her condition was  d/w the Nurses and Dietician. Patient has been getting more lethargic and sleeps most of the time. She is not eating well either. She is more confused and lethargic. She has lost almost 10 lbs is now 130 lbs. Her diet is also downgraded to Dysphagia 2 Puree diet. As d/w son he wants her to be comfortable with no aggressive work up. She also had redness in her Left eye after her Tobrex course was finished.    Past Medical History:  Diagnosis Date  . Back pain   . Confusion   . DNR (do not resuscitate)   . GERD (gastroesophageal reflux disease)   . Glaucoma   . HOH (hard  of hearing)   . Pelvic fracture (HCC)    snf  . Thyroid disease    Past Surgical History:  Procedure Laterality Date  . ABDOMINAL SURGERY    . CATARACT EXTRACTION    . HEMORRHOID SURGERY      Allergies  Allergen Reactions  . Latex Itching  . Onion Nausea And Vomiting  . Other     SPANDEX ALLERGY  . Penicillins Other (See Comments)    UNKNOWN REACTION  . Sulfa Antibiotics Other (See Comments)    UNKNOWN REACTION    Outpatient Encounter Medications as of 11/06/2017  Medication Sig  . acetaminophen (TYLENOL) 325 MG tablet Take 650 mg by mouth every 4 (four) hours as needed.  Marland Kitchen. acetaminophen (TYLENOL) 500 MG tablet Take 500 mg by mouth daily.  Marland Kitchen. aspirin EC 81 MG EC tablet Take 1 tablet (81 mg total) by mouth daily.  . calcitonin, salmon, (MIACALCIN/FORTICAL) 200 UNIT/ACT nasal spray Place 1 spray into alternate nostrils daily.   . Calcium Carbonate-Vitamin D (CALCIUM 600 + D PO) Take 1 tablet by mouth every morning.   . cetaphil (CETAPHIL) cream Apply topical to legs lightly  twice a day  . Cholecalciferol (VITAMIN D3) 1000 UNITS CAPS Take 1 capsule by mouth every morning.   Marland Kitchen. levothyroxine (SYNTHROID, LEVOTHROID) 75 MCG tablet Take 75 mcg by mouth daily before breakfast.  . polycarbophil (FIBERCON) 625 MG tablet Take 625 mg  by mouth at bedtime.  . polyethylene glycol (MIRALAX / GLYCOLAX) packet Take 17 g by mouth daily.   . ranitidine (ZANTAC) 150 MG tablet Take 150 mg by mouth at bedtime.  . timolol (BETIMOL) 0.5 % ophthalmic solution Place 1 drop into both eyes at bedtime.  . [DISCONTINUED] tobramycin (TOBREX) 0.3 % ophthalmic solution Place 1 drop into both eyes 2 (two) times daily.    No facility-administered encounter medications on file as of 11/06/2017.      Review of Systems  Unable to perform ROS: Dementia    Immunization History  Administered Date(s) Administered  . DT 12/30/2010  . Influenza Split 11/06/2009, 10/26/2010  . Influenza, High Dose Seasonal PF  02/05/2013, 12/10/2013, 10/05/2014  . Influenza, Seasonal, Injecte, Preservative Fre 10/05/2014  . Influenza-Unspecified 11/06/2009, 10/26/2010, 02/05/2013, 12/10/2013, 10/05/2014, 09/27/2016, 10/03/2017  . PPD Test 02/14/2015  . Pneumococcal Polysaccharide-23 10/26/2010  . Pneumococcal-Unspecified 12/30/1978, 10/10/2016  . Td 12/30/2010   Pertinent  Health Maintenance Due  Topic Date Due  . DEXA SCAN  11/29/2017 (Originally 04/14/1979)  . PNA vac Low Risk Adult (2 of 2 - PCV13) 11/29/2017 (Originally 10/10/2017)  . INFLUENZA VACCINE  Completed   No flowsheet data found. Functional Status Survey:    Vitals:   11/06/17 0911  BP: (!) 110/54  Pulse: 89  Resp: (!) 22  Temp: 98.4 F (36.9 C)  TempSrc: Oral  SpO2: 95%  Weight: 130 lb (59 kg)  Height: 5\' 5"  (1.651 m)   Body mass index is 21.63 kg/m. Physical Exam  Constitutional: She appears well-developed.  HENT:  Head: Normocephalic.  Mouth/Throat: Oropharynx is clear and moist.  Eyes: Left conjunctiva is injected.  Neck: Neck supple.  Cardiovascular: Normal rate and normal heart sounds.  Pulmonary/Chest: Effort normal and breath sounds normal. No respiratory distress. She has no wheezes. She has no rales.  Abdominal: Soft. Bowel sounds are normal. She exhibits no distension. There is no tenderness. There is no rebound.  Musculoskeletal: She exhibits no edema.  Neurological:  Patient asleep in her Wheel chair. Smiles but then goes to sleep again. Doesn;t talk or respond. Does follow some commands.  Skin: Skin is warm and dry.    Labs reviewed: Recent Labs    07/23/17 0747 09/11/17 1515 09/17/17 0705  NA 132* 131* 133*  K 4.2 4.0 3.7  CL 94* 92* 95*  CO2 31 29 30   GLUCOSE 92 108* 98  BUN 19 18 17   CREATININE 0.84 0.83 0.81  CALCIUM 9.3 9.6 9.8   Recent Labs    02/14/17 0700 06/04/17 0707 09/11/17 1515  AST 14* 14* 21  ALT 10* 12* 13*  ALKPHOS 48 50 61  BILITOT 0.4 0.5 0.7  PROT 6.5 6.6 7.9  ALBUMIN  3.3* 3.4* 4.1   Recent Labs    02/14/17 0700 06/04/17 0707 08/28/17 0719 09/11/17 1515  WBC 4.8 4.7 4.8 8.6  NEUTROABS 2.1  --  2.4 5.7  HGB 13.2 13.2 13.2 14.8  HCT 39.8 40.3 39.4 44.4  MCV 89.4 89.2 90.2 89.9  PLT 325 315 276 385   Lab Results  Component Value Date   TSH 0.440 09/11/2017   Lab Results  Component Value Date   HGBA1C 5.8 (H) 01/30/2015   Lab Results  Component Value Date   CHOL 105 01/30/2015   HDL 44 01/30/2015   LDLCALC 45 01/30/2015   TRIG 78 01/30/2015   CHOLHDL 2.4 01/30/2015    Significant Diagnostic Results in last 30 days:  No results found.  Assessment/Plan  Lethargy Not on any meds which can cause her to be lethargic.  Also Labs have been normal before. Son does not want any aggressive work up. Will continue supportive care. Continue to encourage PO feeds.  Recurrence of Blepharoconjunctivitis of Left eye She was seen by Ophthalmology and was started on Tobramycin for 6 weeks. But patient has had recurrence since it was stopped. Will restart her on it for another 2 weeks.  Hypertension Stable on no meds  GERD On ranitidine Hypothyroidism TSH normal in 09/18 Continue same dose  Chronic back pain due to osteoporosis  Stable on  calcitonin spray .Ultram and Neurontin was  D'ced.due to her Lethargy.    Family/ staff Communication:   Labs/tests ordered:    Total time spent in this patient care encounter was _25 minutes; greater than 50% of the visit spent counseling patient, reviewing records , Labs and coordinating care for problems addressed at this encounter.

## 2017-11-24 ENCOUNTER — Encounter: Payer: Self-pay | Admitting: Internal Medicine

## 2017-11-24 ENCOUNTER — Non-Acute Institutional Stay (SKILLED_NURSING_FACILITY): Payer: 59 | Admitting: Internal Medicine

## 2017-11-24 DIAGNOSIS — Z515 Encounter for palliative care: Secondary | ICD-10-CM | POA: Diagnosis not present

## 2017-11-24 DIAGNOSIS — R5383 Other fatigue: Secondary | ICD-10-CM

## 2017-11-24 DIAGNOSIS — R627 Adult failure to thrive: Secondary | ICD-10-CM | POA: Diagnosis not present

## 2017-11-24 NOTE — Progress Notes (Signed)
Location:   [facility] Room Number: 142/D Place of Service:  SNF [phone]) Provider:  Sabino Dick, MD  Patient Care Team: Mahlon Gammon, MD as PCP - General (Internal Medicine)  Extended Emergency Contact Information Primary Emergency Contact: Butchko,Nathan Address: [address]          [place], [place] Home Phone: [phone] Mobile Phone: [phone] Relation: Son  Code Status:  DNR Goals of care: Advanced Directive information Advanced Directives 11/24/2017  Does Patient Have a Medical Advance Directive? Yes  Type of Advance Directive Out of facility DNR (pink MOST or yellow form)  Does patient want to make changes to medical advance directive? No - Patient declined  Copy of Healthcare Power of Attorney in Chart? No - copy requested  Would patient like information on creating a medical advance directive? -  Pre-existing out of facility DNR order (yellow form or pink MOST form) -      -Chief complaint-acute visit follow-up failure to thrive-end-of-life issues  HPI:  Pt is a 83 y.o. female seen today for an acute visit for failure to thrive-end-of-life care.  She has been a long-term resident here and been stable with her numerous medical issues including hypertension GERD chronic back pain which was relieved with calcitonin spray as well as tramadol and Neurontin  Last few weeks she has been increasingly lethargic and now essentially is not taking any of her meds-she is also not really eating- this has been a gradual decline- her son has previously requested no aggressive interventions here with emphasis on comfort.  She appears to be entering the final stages of life again does not really have any p.o. intake of significance- she does not appear to be in any discomfort.        Past Medical History:  Diagnosis Date  . Back pain   . Confusion   . DNR (do not resuscitate)   . GERD  (gastroesophageal reflux disease)   . Glaucoma   . HOH (hard of hearing)   . Pelvic fracture (HCC)    snf  . Thyroid disease    Past Surgical History:  Procedure Laterality Date  . ABDOMINAL SURGERY    . CATARACT EXTRACTION    . HEMORRHOID SURGERY      Allergies  Allergen Reactions  . Latex Itching  . Onion Nausea And Vomiting  . Other     SPANDEX ALLERGY  . Penicillins Other (See Comments)    UNKNOWN REACTION  . Sulfa Antibiotics Other (See Comments)    UNKNOWN REACTION    Outpatient Encounter Medications as of 11/24/2017  Medication Sig  . acetaminophen (TYLENOL) 325 MG tablet Take 650 mg by mouth every 4 (four) hours as needed.  Marland Kitchen acetaminophen (TYLENOL) 500 MG tablet Take 500 mg by mouth daily.  Marland Kitchen aspirin EC 81 MG EC tablet Take 1 tablet (81 mg total) by mouth daily.  . calcitonin, salmon, (MIACALCIN/FORTICAL) 200 UNIT/ACT nasal spray Place 1 spray into alternate nostrils daily.   . Calcium Carbonate-Vitamin D (CALCIUM 600 + D PO) Take 1 tablet by mouth every morning.   . cetaphil (CETAPHIL) cream Apply topical to legs lightly  twice a day  . Cholecalciferol (VITAMIN D3) 1000 UNITS CAPS Take 1 capsule by mouth every morning.   Marland Kitchen levothyroxine (SYNTHROID, LEVOTHROID) 75 MCG tablet Take 75 mcg by mouth daily before breakfast.  . polycarbophil (FIBERCON) 625 MG tablet Take 625 mg by mouth  at bedtime.  . polyethylene glycol (MIRALAX / GLYCOLAX) packet Take 17 g by mouth daily.   . ranitidine (ZANTAC) 150 MG tablet Take 150 mg by mouth at bedtime.  . timolol (BETIMOL) 0.5 % ophthalmic solution Place 1 drop into both eyes at bedtime.  . traMADol (ULTRAM) 50 MG tablet Take 50 mg by mouth every 6 (six) hours as needed.   No facility-administered encounter medications on file as of 11/24/2017.     Review of Systems  Not possible secondary patient not speaking please see HPI   Immunization History  Administered Date(s) Administered  . DT 12/30/2010  . Influenza Split  11/06/2009, 10/26/2010  . Influenza, High Dose Seasonal PF 02/05/2013, 12/10/2013, 10/05/2014  . Influenza, Seasonal, Injecte, Preservative Fre 10/05/2014  . Influenza-Unspecified 11/06/2009, 10/26/2010, 02/05/2013, 12/10/2013, 10/05/2014, 09/27/2016, 10/03/2017  . PPD Test 02/14/2015  . Pneumococcal Conjugate-13 02/27/2016  . Pneumococcal Polysaccharide-23 10/26/2010  . Pneumococcal-Unspecified 12/30/1978, 10/10/2016  . Td 12/30/2010   Pertinent  Health Maintenance Due  Topic Date Due  . DEXA SCAN  11/29/2017 (Originally 04[birth year]14/1979)  . INFLUENZA VACCINE  Completed  . PNA vac Low Risk Adult  Completed   No flowsheet data found. Functional Status Survey:     Temperature is 99.2 pulse is 96 respirations of 18 blood pressure is 125/75 O2 saturation room air is 90% Physical Exam  In general this is a frail elderly female she is lying in bed comfortably she does make eye contact but is not really speaking today.  Her skin is warm and dry she has numerous solar-induced changes which are chronic as well as a right forehead lesion which is been there for some time.  Eyes pupils do appear reactive light sclera and conjunctive appear to be fairly clear.  Oropharynx mucous membranes are somewhat dry she does hold her mouth and slightly open position.  Respiratory does not really follow verbal commands but could not appreciate any labored breathing or overt congestion.  Heart is regular rate and rhythm without murmur gallop or rub she does not really have significant lower extremity edema she does have venous stasis color changes bilaterally.  Abdomen is soft nontender with positive bowel sounds.  Musculoskeletal he does not really follow verbal commands but is moving her upper extremities-has baseline lower extremity weakness with venous stasis changes but this appears relatively baseline.  Neurologic could not really appreciate lateralizing findings she is able to move her upper  extremities at baseline she is able to make eye contact but does not really speak.  Psych as noted above     Labs reviewed: Recent Labs    07/23/17 0747 09/11/17 1515 09/17/17 0705  NA 132* 131* 133*  K 4.2 4.0 3.7  CL 94* 92* 95*  CO2 31 29 30   GLUCOSE 92 108* 98  BUN 19 18 17   CREATININE 0.84 0.83 0.81  CALCIUM 9.3 9.6 9.8   Recent Labs    02/14/17 0700 06/04/17 0707 09/11/17 1515  AST 14* 14* 21  ALT 10* 12* 13*  ALKPHOS 48 50 61  BILITOT 0.4 0.5 0.7  PROT 6.5 6.6 7.9  ALBUMIN 3.3* 3.4* 4.1   Recent Labs    02/14/17 0700 06/04/17 0707 08/28/17 0719 09/11/17 1515  WBC 4.8 4.7 4.8 8.6  NEUTROABS 2.1  --  2.4 5.7  HGB 13.2 13.2 13.2 14.8  HCT 39.8 40.3 39.4 44.4  MCV 89.4 89.2 90.2 89.9  PLT 325 315 276 385   Lab Results  Component Value Date  TSH 0.440 09/11/2017   Lab Results  Component Value Date   HGBA1C 5.8 (H) 01/30/2015   Lab Results  Component Value Date   CHOL 105 01/30/2015   HDL 44 01/30/2015   LDLCALC 45 01/30/2015   TRIG 78 01/30/2015   CHOLHDL 2.4 01/30/2015    Significant Diagnostic Results in last 30 days:  No results found.  Assessment/Plan  #1 lethargy-failure to thrive- end-of-life care- at this point patient appears to be in the process of dying-she does not appear to be in any distress-will order a hospice consult.  Continue to monitor his status and will discontinue the great majority of her medicines including Tylenol- aspirin- ranitidine-calcium-FiberCon-Synthroid-calcitonin-MiraLAX- and her eyedrops- will continue the tramadol as needed for now although I suspect if this progresses we may move more to a Roxanol approach again will await hospice input as well.  Currently she does not appear to be in any distress.  [id number]

## 2017-11-28 ENCOUNTER — Non-Acute Institutional Stay (SKILLED_NURSING_FACILITY): Payer: 59 | Admitting: Internal Medicine

## 2017-11-28 ENCOUNTER — Encounter: Payer: Self-pay | Admitting: Internal Medicine

## 2017-11-28 DIAGNOSIS — Z515 Encounter for palliative care: Secondary | ICD-10-CM

## 2017-11-28 DIAGNOSIS — R52 Pain, unspecified: Secondary | ICD-10-CM

## 2017-11-28 NOTE — Progress Notes (Signed)
Location:   [facility] Room Number: 142/D Place of Service:  SNF [phone](31) Provider:  Sabino DickArlo Damany Eastman  Gupta, [name], MD  Patient Care Team: Mahlon GammonGupta, [name], MD as PCP - General (Internal Medicine)  Extended Emergency Contact Information Primary Emergency Contact: Ciccarelli,Nathan Address: [address]          [place], [place] Home Phone: [phone] Mobile Phone: [phone] Relation: Son  Code Status:  DNR Goals of care: Advanced Directive information Advanced Directives 11/28/2017  Does Patient Have a Medical Advance Directive? Yes  Type of Advance Directive Out of facility DNR (pink MOST or yellow form)  Does patient want to make changes to medical advance directive? No - Patient declined  Copy of Healthcare Power of Attorney in Chart? No - copy requested  Would patient like information on creating a medical advance directive? -  Pre-existing out of facility DNR order (yellow form or pink MOST form) -     Chief complaint-acute visit secondary to end-of-life issues-pain management follow-up will  HPI:  Pt is a [age] y.o. female seen today for an acute visit for failure to thrive and end-of-l she is been a long-term resident here has numerous medical issues including hypertension GERD chronic back pain.  She has had a significant decline here over the past few weeks and her medications of largely been discontinued since she is not able to take them-family desires essentially comfort care-hospice consult is pending.  She has had a significant decline this afternoon however nursing staff reports some increased respirations of tachycardia-previously she had been comfortable but it appears at this point she is becoming somewhat uncomfortable.  e loss and also is essentially unobtainable please see HPI     Past Medical History:  Diagnosis Date  . Back pain   . Confusion   . DNR (do not resuscitate)   . GERD  (gastroesophageal reflux disease)   . Glaucoma   . HOH (hard of hearing)   . Pelvic fracture (HCC)    snf  . Thyroid disease    Past Surgical History:  Procedure Laterality Date  . ABDOMINAL SURGERY    . CATARACT EXTRACTION    . HEMORRHOID SURGERY      Allergies  Allergen Reactions  . Latex Itching  . Onion Nausea And Vomiting  . Other     SPANDEX ALLERGY  . Penicillins Other (See Comments)    UNKNOWN REACTION  . Sulfa Antibiotics Other (See Comments)    UNKNOWN REACTION    Outpatient Encounter Medications as of 11/28/2017  Medication Sig  . acetaminophen (TYLENOL) 325 MG suppository Place 325 mg rectally every 4 (four) hours as needed.  . cetaphil (CETAPHIL) cream Apply topical to legs lightly  twice a day  . morphine 20 MG/5ML solution Take 20 mg by mouth every 2 (two) hours as needed for pain.  . traMADol (ULTRAM) 50 MG tablet Take 50 mg by mouth every 6 (six) hours as needed.  . [DISCONTINUED] acetaminophen (TYLENOL) 325 MG tablet Take 650 mg by mouth every 4 (four) hours as needed.  . [DISCONTINUED] acetaminophen (TYLENOL) 500 MG tablet Take 500 mg by mouth daily.  . [DISCONTINUED] aspirin EC 81 MG EC tablet Take 1 tablet (81 mg total) by mouth daily.  . [DISCONTINUED] calcitonin, salmon, (MIACALCIN/FORTICAL) 200 UNIT/ACT nasal spray Place 1 spray into alternate nostrils daily.   . [DISCONTINUED] Calcium Carbonate-Vitamin D (CALCIUM 600 + D PO) Take 1 tablet by mouth  every morning.   . [DISCONTINUED] Cholecalciferol (VITAMIN D3) 1000 UNITS CAPS Take 1 capsule by mouth every morning.   . [DISCONTINUED] levothyroxine (SYNTHROID, LEVOTHROID) 75 MCG tablet Take 75 mcg by mouth daily before breakfast.  . [DISCONTINUED] polycarbophil (FIBERCON) 625 MG tablet Take 625 mg by mouth at bedtime.  . [DISCONTINUED] polyethylene glycol (MIRALAX / GLYCOLAX) packet Take 17 g by mouth daily.   . [DISCONTINUED] ranitidine (ZANTAC) 150 MG tablet Take 150 mg by mouth at bedtime.  .  [DISCONTINUED] timolol (BETIMOL) 0.5 % ophthalmic solution Place 1 drop into both eyes at bedtime.   No facility-administered encounter medications on file as of 11/28/2017.     Review of Systems    is essentially unobtainable please see HPI   Immunization History  Administered Date(s) Administered  . DT 12/30/2010  . Influenza Split 11/06/2009, 10/26/2010  . Influenza, High Dose Seasonal PF 02/05/2013, 12/10/2013, 10/05/2014  . Influenza, Seasonal, Injecte, Preservative Fre 10/05/2014  . Influenza-Unspecified 11/06/2009, 10/26/2010, 02/05/2013, 12/10/2013, 10/05/2014, 09/27/2016, 10/03/2017  . PPD Test 02/14/2015  . Pneumococcal Conjugate-13 02/27/2016  . Pneumococcal Polysaccharide-23 10/26/2010  . Pneumococcal-Unspecified 12/30/1978, 10/10/2016  . Td 12/30/2010   Pertinent  Health Maintenance Due  Topic Date Due  . DEXA SCAN  11/29/2017 (Originally [birth year]/1979)  . INFLUENZA VACCINE  Completed  . PNA vac Low Risk Adult  Completed   No flowsheet data found. Functional Status Survey:    Vitals:   11/28/17 1631  Pulse: (!) 110  Temp: 99 F (37.2 C)  TempSrc: Oral  Respirations are in the 94 's  Physical Exam  General this is a frail elderly female she appears to be somewhat uncomfortable with elevated respirations.  Her skin is slightly diaphoretic.  She is not really making any kind contact.  Oropharynx mucous membranes are dry.  Chest she does not really respond to verbal commands I could not really appreciate any overt congestion there is shallow air entry.  Heart is tachycardic with rate about 110-220.  Abdomen is soft does not appear to be tender there are positive bowel sounds.  Musculoskeletal has general frailty again does not really respond to verbal commands.  Neurologic again not really responding to verbal commands she does not really make eye contact  Labs reviewed: Recent Labs    07/23/17 0747 09/11/17 1515 09/17/17 0705  NA 132* 131* 133*    K 4.2 4.0 3.7  CL 94* 92* 95*  CO2 31 29 30   GLUCOSE 92 108* 98  BUN 19 18 17   CREATININE 0.84 0.83 0.81  CALCIUM 9.3 9.6 9.8   Recent Labs    02/14/17 0700 06/04/17 0707 09/11/17 1515  AST 14* 14* 21  ALT 10* 12* 13*  ALKPHOS 48 50 61  BILITOT 0.4 0.5 0.7  PROT 6.5 6.6 7.9  ALBUMIN 3.3* 3.4* 4.1   Recent Labs    02/14/17 0700 06/04/17 0707 08/28/17 0719 09/11/17 1515  WBC 4.8 4.7 4.8 8.6  NEUTROABS 2.1  --  2.4 5.7  HGB 13.2 13.2 13.2 14.8  HCT 39.8 40.3 39.4 44.4  MCV 89.4 89.2 90.2 89.9  PLT 325 315 276 385   Lab Results  Component Value Date   TSH 0.440 09/11/2017   Lab Results  Component Value Date   HGBA1C 5.8 (H) 01/30/2015   Lab Results  Component Value Date   CHOL 105 01/30/2015   HDL 44 01/30/2015   LDLCALC 45 01/30/2015   TRIG 78 01/30/2015   CHOLHDL 2.4 01/30/2015  Significant Diagnostic Results in last 30 days:  No results found.  Assessment/Plan  #1-failure to thrive-end-of-life issues.  Patient does appear to be uncomfortable-- is tachycardic I suspect this is secondary to discomfort pain will start Roxanol 5 mg every 2 hours as needed-also will add oxygen.  This was discussed with nursing will be monitoring her closely.  Addendum later--I did reassess patient- her heart rate was down to slightly below 100 respirations were down in the low 20s-she appeared to be more comfortable which is encouraging this will need continued monitoring-I also did discuss her status with family in the room- again emphasis is on comfort Roxanol appears to be helping as well as the oxygen.  [id number]-of note greater than 25 minutes spent assessing patient-discussing her status with nursing staff as well as with her family- reviewing her chart- and reassessing patient-as well as coordinating plan of care-

## 2017-11-30 ENCOUNTER — Encounter: Payer: Self-pay | Admitting: Internal Medicine

## 2017-12-30 DEATH — deceased
